# Patient Record
Sex: Female | Born: 1960
Health system: Southern US, Community
[De-identification: ages and names within clinical notes are randomized; demographics above are authoritative.]

## PROBLEM LIST (undated history)

## (undated) ENCOUNTER — Emergency Department: Admission: EM | Payer: Self-pay | Source: Home / Self Care

## (undated) DIAGNOSIS — I4891 Unspecified atrial fibrillation: Secondary | ICD-10-CM

## (undated) DIAGNOSIS — T7840XA Allergy, unspecified, initial encounter: Secondary | ICD-10-CM

## (undated) DIAGNOSIS — F419 Anxiety disorder, unspecified: Secondary | ICD-10-CM

## (undated) DIAGNOSIS — K219 Gastro-esophageal reflux disease without esophagitis: Secondary | ICD-10-CM

## (undated) DIAGNOSIS — E039 Hypothyroidism, unspecified: Secondary | ICD-10-CM

## (undated) HISTORY — DX: Gastro-esophageal reflux disease without esophagitis: K21.9

## (undated) HISTORY — PX: BRAIN SURGERY: SHX531

## (undated) HISTORY — PX: CARDIAC ELECTROPHYSIOLOGY MAPPING AND ABLATION: SHX1292

## (undated) HISTORY — DX: Allergy, unspecified, initial encounter: T78.40XA

## (undated) HISTORY — DX: Anxiety disorder, unspecified: F41.9

## (undated) NOTE — Progress Notes (Signed)
 Formatting of this note is different from the original. [facility] NEW PATIENT   Patient Name:  Kristi Carter    DOB:  [birth year]    MEDICAL RECORD [id number] Date: 02/21/2013.    Primary Care Provider: Elna Grieves, MD Primary Cardiologist: Alm Pattee, MD Primary Electrophysiologist: Ozell Ra, MD  Subjective:    History of Present Illness Kristi Carter is a 39 y.o. female who presents today for evaluation.  She is present with her husband, who provides history as well.   Arrhythmia: She presents for evaluation of fast heart rate and palpitations. Onset was 1 month ago, with stable course since that time.  She also complains of chest pain, dizziness, palpitations, rapid heart beat and shortness of breath. She denies calf pain, cough and syncope.  She is current on Toprol, and has been tried on no antiarrhythmic therapy.  She has had prior testing with echo and stress testing limiting cardiology within the last 3 years.  He states that she has had these palpitations on since her 21s with intermittent spells she been able to terminate with Valsalva maneuvers.  About 4-6 weeks ago she went to see Dr. Bartolo, who adjusted her thyroid  medicine.  In about 2-3 weeks ago she had recurrence of spells.  She has been to the Emergency Room twice (once on 12/1, once on 12/6) with a repeat episode occurred at work causing her to go home early.  The first episode required a tendency to terminate.  She has associated symptoms of heart racing or chest pressure.  More recently she has had more spell of chest pressure which are more related to her tachycardia spells.  She was referred to Dr. Pattee, whom she saw last week.  She then referred to see me for consideration for ablation.  She has a past history of hypothyroid.  She denies a past history of atrial fibrillation, CAD and HTN  Allergies Review of patient's allergies indicates no known allergies.  Medications Current Outpatient  Prescriptions  Medication Sig Dispense Refill  ? clonazePAM  (KLONOPIN ) 0.5 MG tablet Take 0.5 mg by mouth every 4 (four) hours as needed for Anxiety.      ? esomeprazole  (NEXIUM ) 40 MG capsule Take 40 mg by mouth nightly.      ? metoprolol (TOPROL-XL) 25 MG 24 hr tablet Take 1 tablet (25 mg total) by mouth daily.  30 tablet  2  ? simvastatin (ZOCOR) 40 MG tablet Take 40 mg by mouth nightly.      ? thyroid , pork, (ARMOUR THYROID ) 30 mg Tab Take by mouth every morning before breakfast.      ? digoxin (LANOXIN) 0.25 MG tablet Take 1 tablet (250 mcg total) by mouth daily.  30 tablet  1   No current facility-administered medications for this visit.   History Past Medical History  Diagnosis Date  ? SVT (supraventricular tachycardia)   ? Hypothyroid    History reviewed. No pertinent past surgical history.  Social History: She  reports that she has never smoked. She does not have any smokeless tobacco history on file. She reports that  drinks alcohol. She reports that she does not use illicit drugs.   Family History: Her family history includes Aneurysm in her paternal grandfather; Heart attack in her paternal grandfather; Hypertension in her paternal grandfather and paternal grandmother; and Stroke in her paternal grandmother.  No arrhythmias, SCD or supraventricular tachycardia   Review of Systems General:  negative for fevers, chills, fatigue, night sweats or unintentional weight  changes  Respiratory: negative for shortness of breath, cough, sputum production, wheezing or hemoptysis  Cardiovascular:   as in HPI.   Gastrointestinal:  negative for nausea, vomiting, diarrhea, heartburn, abdominal pain, blood in stool or melena  Musculoskeletal: GU: Endocrine; Hematological: Psychological:  negative for joint pain, joint swelling or myalgias negative for dysuria + hypothyroid negative for easy bruising or bleeding negative for anxiety, depression or insomnia  Neurological: negative  for headaches, seizures, tremors or sensory changes   Objective:    Physical Exam  BP 106/80  Pulse 81  Ht 5' 3 (1.6 m)  Wt 156 lb (70.761 kg)  BMI 27.64 kg/m2 Gen: Eyes:  Well developed, well-nourished female in no acute distress. No xanthelasmas, sclera anicteric   Neck: No lymphadenopathy or JVD.  Carotids brisk  Lungs:   Inspection normal. No use of accessory muscles. CTAB.  No wheezes, rales or rhonchi  Heart:  RRR.  No murmurs  GI/ABD: Soft, non-tender, no distention, no organomegaly.  +BS  Ext: No cyanosis. 0  edema.   Pulses: Pulses 1+ bilaterally  Skin: Intact, no rashes or lesions  Neuro: Psych: Mental status grossly normal Patient's mood and affect are normal   Cardiographics   1. Electrocardiogram [02/07/2013]: Supraventricular tachycardia, heart rate almost 200.  Appears to have a retrograde P wave within 40 ms the QRS complex.  Recovery sinus rhythm with nonspecific ST-T changes.  No delta wave appreciated. 2. Labs from 12/1 reviewed: Normal chemistry and complete blood count.  Thyroid  stable.  Assessment:     ICD-9-CM  1. SVT (supraventricular tachycardia) 427.89   She presents today for evaluation.  She has supraventricular tachycardia that is amenable to that is indeterminate.  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  She has had a flare in the last month, possibly coinciding with adjustment of thyroid  medicine.  That being said she is reassured rhythm that is recurrent symptomatic.  She has been to the Emergency Room twice this past month.  Plan:   Risks, benefits, and alternatives of care were discussed with patient and family today. With the recurrence of a narrow complex supraventricular tachycardia, she meets indications to consider ablation.  She is a good candidate based on her age. Risks include bleeding, stroke, pneumothorax, tearing, need for percutaneous drainage, need for intubation, emergent surgery, and even death.   After this discussion,  the patient wishes to proceed.  Will schedule accordingly.  Would like to obtain recent echocardiography stress testing for review.  It would assume that they are not within normal limits. Would add low-dose digoxin, 0.25 mg to her regimen for short-term antiarrhythmic/AV nodal control.  If spells progressed before ablation, could consider antiarrhythmic therapy as well Follow Up: No Follow-up on file.  Orders:  Orders Placed This Encounter  Procedures  ? Obtain medical records  ? Ablation SVT      The above issues and plan of care were discussed thoroughly today, with ample opportunity to ask questions.  The plan was understood and agreed to as noted above.  If there are worsening symptoms in the interim, she should seek medical attention, either at our office, or other immediate options.  Thank you for allowing me to participate her care.  If you have any questions, please do not hesitate to call.  Electronically signed by:  Ozell JINNY Ra, MD, [facility] 02/21/2013 11:44  ____________________________________________________________________________________  Portions of this note are dictated using Dragon natural speaking voice recognition software. Variances in spelling and vocabulary are  possible and unintentional. Please notify dino if any discrepancies are noted. Electronically signed by Ozell JINNY Ra, MD at 02/21/2013 11:58 AM EST

---

## 1988-03-10 HISTORY — PX: AUGMENTATION MAMMAPLASTY: SUR837

## 1999-05-14 ENCOUNTER — Other Ambulatory Visit: Admission: RE | Admit: 1999-05-14 | Discharge: 1999-05-14 | Payer: Self-pay | Admitting: Obstetrics and Gynecology

## 2000-11-23 ENCOUNTER — Other Ambulatory Visit: Admission: RE | Admit: 2000-11-23 | Discharge: 2000-11-23 | Payer: Self-pay | Admitting: Obstetrics and Gynecology

## 2001-12-08 ENCOUNTER — Other Ambulatory Visit: Admission: RE | Admit: 2001-12-08 | Discharge: 2001-12-08 | Payer: Self-pay | Admitting: Obstetrics and Gynecology

## 2014-10-20 DIAGNOSIS — K30 Functional dyspepsia: Secondary | ICD-10-CM | POA: Insufficient documentation

## 2014-10-20 DIAGNOSIS — F419 Anxiety disorder, unspecified: Secondary | ICD-10-CM | POA: Insufficient documentation

## 2014-10-20 DIAGNOSIS — E785 Hyperlipidemia, unspecified: Secondary | ICD-10-CM | POA: Insufficient documentation

## 2017-07-24 ENCOUNTER — Other Ambulatory Visit: Payer: Self-pay

## 2017-07-24 ENCOUNTER — Emergency Department
Admission: EM | Admit: 2017-07-24 | Discharge: 2017-07-24 | Disposition: A | Discharge status: Home / Self Care | Payer: BLUE CROSS/BLUE SHIELD | Attending: Emergency Medicine | Admitting: Emergency Medicine

## 2017-07-24 ENCOUNTER — Emergency Department: Payer: BLUE CROSS/BLUE SHIELD

## 2017-07-24 DIAGNOSIS — R197 Diarrhea, unspecified: Secondary | ICD-10-CM | POA: Diagnosis not present

## 2017-07-24 DIAGNOSIS — R112 Nausea with vomiting, unspecified: Secondary | ICD-10-CM | POA: Insufficient documentation

## 2017-07-24 DIAGNOSIS — R55 Syncope and collapse: Secondary | ICD-10-CM

## 2017-07-24 DIAGNOSIS — R079 Chest pain, unspecified: Secondary | ICD-10-CM | POA: Diagnosis present

## 2017-07-24 DIAGNOSIS — E039 Hypothyroidism, unspecified: Secondary | ICD-10-CM | POA: Insufficient documentation

## 2017-07-24 HISTORY — DX: Hypothyroidism, unspecified: E03.9

## 2017-07-24 LAB — BASIC METABOLIC PANEL
Anion gap: 9 (ref 5–15)
BUN: 15 mg/dL (ref 6–20)
CO2: 24 mmol/L (ref 22–32)
Calcium: 9.4 mg/dL (ref 8.9–10.3)
Chloride: 103 mmol/L (ref 101–111)
Creatinine, Ser: 0.58 mg/dL (ref 0.44–1.00)
GFR calc Af Amer: >60 mL/min (ref >60)
GFR calc non Af Amer: >60 mL/min (ref >60)
Glucose, Bld: 133 mg/dL — ABNORMAL HIGH (ref 65–99)
Potassium: 4.3 mmol/L (ref 3.5–5.1)
Sodium: 136 mmol/L (ref 135–145)

## 2017-07-24 LAB — CBC
HCT: 41.6 % (ref 35.0–47.0)
Hemoglobin: 14.4 g/dL (ref 12.0–16.0)
MCH: 34.2 pg — ABNORMAL HIGH (ref 26.0–34.0)
MCHC: 34.6 g/dL (ref 32.0–36.0)
MCV: 98.8 fL (ref 80.0–100.0)
Platelets: 373 10*3/uL (ref 150–440)
RBC: 4.21 MIL/uL (ref 3.80–5.20)
RDW: 13.3 % (ref 11.5–14.5)
WBC: 7.1 10*3/uL (ref 3.6–11.0)

## 2017-07-24 LAB — TROPONIN I
Troponin I: <0.03 ng/mL (ref <0.03)
Troponin I: <0.03 ng/mL (ref <0.03)

## 2017-07-24 MED ORDER — SODIUM CHLORIDE 0.9 % IV BOLUS
1000.0000 mL | Freq: Once | INTRAVENOUS | Status: AC
  Administered 2017-07-24: 1000 mL via INTRAVENOUS

## 2017-07-24 MED ORDER — ONDANSETRON HCL 4 MG PO TABS: 4.0000 mg | ORAL_TABLET | Freq: Every day | ORAL | 0 refills | Status: DC | PRN

## 2017-07-24 MED ORDER — ONDANSETRON HCL 4 MG/2ML IJ SOLN
4.0000 mg | Freq: Once | INTRAMUSCULAR | Status: AC
  Administered 2017-07-24: 4 mg via INTRAVENOUS
  Filled 2017-07-24: qty 2

## 2017-07-24 NOTE — ED Triage Notes (Signed)
Pt pulled for EKG.

## 2017-07-24 NOTE — ED Triage Notes (Signed)
Pt states about 3 hours ago pt felt hot and like she was going to pass out. States L hand feels numb and c/o of CP at central/L side. Also vomiting that began today as well. Alert, oriented, in wheelchair. Family with pt. Had heart ablation done a few years ago. Pt denies pain but states she feels like she is going to pass out.

## 2017-07-24 NOTE — ED Provider Notes (Signed)
[facility] Emergency Department Provider Note  ____________________________________________   First MD Initiated Contact with Patient 07/24/17 1643     (approximate)  I have reviewed the triage vital signs and the nursing notes.   HISTORY  Chief Complaint Chest Pain and Nausea  HPI Kristi Carter is a 57 y.o. female with a history of hypothyroidism as well as atrial fibrillation status post ablation 2 years ago who is presenting to the emergency department today with chest discomfort as well as near syncope and feeling uncoordinated in her left upper and lower extremity.  She says that she was doing yard work today when she came in at 1 PM and started feeling like she was going to pass out.  Said that everything "went black.  She said that she also had several episodes of vomiting and is now nauseous but has not vomited since.  Also with diarrhea this morning.  No known sick contacts.  Denies any chest pain to me but says that she has a vague discomfort and she is concerned about her arrhythmia.  She denies taking any blood thinners.  Also says that she is on and off feelings of incoordination with her left hand and left foot.  However, denies a decrease in sensation although she describes the feeling as "numbness."  Past Medical History:  Diagnosis Date  . Hypothyroid     There are no active problems to display for this patient.   Past Surgical History:  Procedure Laterality Date  . CARDIAC ELECTROPHYSIOLOGY MAPPING AND ABLATION      Prior to Admission medications   Not on File    Allergies Patient has no known allergies.  History reviewed. No pertinent family history.  Social History Social History   Tobacco Use  . Smoking status: Never Smoker  Substance Use Topics  . Alcohol use: Yes  . Drug use: Not on file    Review of Systems  Constitutional: No fever/chills Eyes: No visual changes. ENT: No sore throat. Cardiovascular: As  above Respiratory: Denies shortness of breath. Gastrointestinal: No abdominal pain.  No constipation. Genitourinary: Negative for dysuria. Musculoskeletal: Negative for back pain. Skin: Negative for rash. Neurological: As above   ____________________________________________   PHYSICAL EXAM:  VITAL SIGNS: ED Triage Vitals  Enc Vitals Group     BP 07/24/17 1555 118/83     Pulse Rate 07/24/17 1555 (!) 102     Resp 07/24/17 1555 20     Temp 07/24/17 1555 98.1 F (36.7 C)     Temp Source 07/24/17 1555 Oral     SpO2 07/24/17 1555 100 %     Weight 07/24/17 1556 145 lb (65.8 kg)     Height 07/24/17 1556 5\' 3"  (1.6 m)     Head Circumference --      Peak Flow --      Pain Score 07/24/17 1556 0     Pain Loc --      Pain Edu? --      Excl. in [place]? --     Constitutional: Alert and oriented. Well appearing and in no acute distress. Eyes: Conjunctivae are normal.  Head: Atraumatic. Nose: No congestion/rhinnorhea. Mouth/Throat: Mucous membranes are moist.  Neck: No stridor.   Cardiovascular: Normal rate, regular rhythm. Grossly normal heart sounds.  Good peripheral circulation with equal and bilateral radial pulses. Respiratory: Normal respiratory effort.  No retractions. Lungs CTAB. Gastrointestinal: Soft and nontender. No distention. No CVA tenderness. Musculoskeletal: No lower extremity tenderness nor edema.  No joint effusions. Neurologic:  Normal speech and language. No gross focal neurologic deficits are appreciated.  Normal gait while walking unassisted.  No ataxia on finger-to-nose testing.  Negative for dysdiadochokinesia bilaterally. Skin:  Skin is warm, dry and intact. No rash noted. Psychiatric: Mood and affect are normal. Speech and behavior are normal.  ____________________________________________   LABS (all labs ordered are listed, but only abnormal results are displayed)  Labs Reviewed  BASIC METABOLIC PANEL - Abnormal; Notable for the following components:       Result Value   Glucose, Bld 133 (*)    All other components within normal limits  CBC - Abnormal; Notable for the following components:   MCH 34.2 (*)    All other components within normal limits  TROPONIN I  TROPONIN I  POC URINE PREG, ED   ____________________________________________  EKG  ED ECG REPORT I, Doran Stabler, the attending physician, personally viewed and interpreted this ECG.   Date: 07/24/2017  EKG Time: 1519  Rate: 105  Rhythm: sinus tachycardia  Axis: Normal  Intervals:none  ST&T Change: No ST segment elevation or depression.  No abnormal T wave inversion.  However, the EKG is confounded by static on the baseline.  ED ECG REPORT I, Doran Stabler, the attending physician, personally viewed and interpreted this ECG.   Date: 07/24/2017  EKG Time: 1728  Rate: 85  Rhythm: normal sinus rhythm  Axis: Normal  Intervals:none  ST&T Change: No ST segment elevation or depression.  No abnormal T wave inversion.  ____________________________________________  RADIOLOGY  Chest x-ray with 2 nodular appearing opacities in the gastric air bubble.  Concern for debris versus mass. ____________________________________________   PROCEDURES  Procedure(s) performed: None  Procedures  Critical Care performed: No  ____________________________________________   INITIAL IMPRESSION / ASSESSMENT AND PLAN / ED COURSE  Pertinent labs & imaging results that were available during my care of the patient were reviewed by me and considered in my medical decision making (see chart for details).  Differential diagnosis includes, but is not limited to, ACS, aortic dissection, pulmonary embolism, cardiac tamponade, pneumothorax, pneumonia, pericarditis, myocarditis, GI-related causes including esophagitis/gastritis, and musculoskeletal chest wall pain.   As part of my medical decision making, I reviewed the following data within the [place] previous  records on file for review  Patient says that she is aware of the nodules in the stomach.  Says that she has had endoscopies in the past with removal of nodules.  However, has not followed up with gastroenterology since moving from [place].  I will give her follow-up with gastroenterology.  Overall I feel the presentation likely evidence of viral illness with nursing be secondary to nausea, vomiting and diarrhea.  So far the patient's EKGs have been reassuring.  Denying any chest pain.  Heart rate on second EKG is 85 bpm.   ----------------------------------------- 6:36 PM on 07/24/2017 -----------------------------------------  Patient at this time with resolution of symptoms.  No nausea.  Denies any feeling of uncoordination with the left upper and lower extremities.  Second troponin is normal.  No further complaint of chest pressure.  I do not believe that the patient is having an acute coronary issue nor a pulmonary embolus.  Likely viral issue causing slight dehydration and near syncope.  I discussed the diagnosis as well as treatment plan with the patient as well as her husband who is at the bedside.  They are understanding of the plan for discharge to home and willing to  comply.  ____________________________________________   FINAL CLINICAL IMPRESSION(S) / ED DIAGNOSES  Nausea vomiting and diarrhea.  Chest pain.    NEW MEDICATIONS STARTED DURING THIS VISIT:  New Prescriptions   No medications on file     Note:  This document was prepared using Dragon voice recognition software and may include unintentional dictation errors.     Orbie Pyo, MD 07/24/17 [phone]

## 2017-09-16 ENCOUNTER — Other Ambulatory Visit: Payer: Self-pay | Admitting: Internal Medicine

## 2017-09-16 DIAGNOSIS — M25473 Effusion, unspecified ankle: Secondary | ICD-10-CM

## 2017-09-18 ENCOUNTER — Ambulatory Visit: Payer: Self-pay

## 2017-09-21 ENCOUNTER — Ambulatory Visit
Admission: RE | Admit: 2017-09-21 | Discharge: 2017-09-21 | Disposition: A | Discharge status: Home / Self Care | Payer: BLUE CROSS/BLUE SHIELD | Source: Ambulatory Visit | Attending: Internal Medicine | Admitting: Internal Medicine

## 2017-09-21 DIAGNOSIS — M25473 Effusion, unspecified ankle: Secondary | ICD-10-CM

## 2017-09-21 DIAGNOSIS — M7989 Other specified soft tissue disorders: Secondary | ICD-10-CM | POA: Insufficient documentation

## 2018-03-05 ENCOUNTER — Other Ambulatory Visit: Payer: Self-pay | Admitting: Endocrinology

## 2018-03-05 DIAGNOSIS — E041 Nontoxic single thyroid nodule: Secondary | ICD-10-CM

## 2018-03-19 ENCOUNTER — Ambulatory Visit
Admission: RE | Admit: 2018-03-19 | Discharge: 2018-03-19 | Disposition: A | Discharge status: Home / Self Care | Payer: BLUE CROSS/BLUE SHIELD | Source: Ambulatory Visit | Attending: Endocrinology | Admitting: Endocrinology

## 2018-03-19 DIAGNOSIS — E041 Nontoxic single thyroid nodule: Secondary | ICD-10-CM

## 2018-04-07 DIAGNOSIS — E039 Hypothyroidism, unspecified: Secondary | ICD-10-CM | POA: Diagnosis not present

## 2018-04-07 DIAGNOSIS — E041 Nontoxic single thyroid nodule: Secondary | ICD-10-CM | POA: Diagnosis not present

## 2018-04-14 DIAGNOSIS — E039 Hypothyroidism, unspecified: Secondary | ICD-10-CM | POA: Diagnosis not present

## 2018-04-14 DIAGNOSIS — E041 Nontoxic single thyroid nodule: Secondary | ICD-10-CM | POA: Diagnosis not present

## 2018-04-14 DIAGNOSIS — K219 Gastro-esophageal reflux disease without esophagitis: Secondary | ICD-10-CM | POA: Diagnosis not present

## 2018-06-14 DIAGNOSIS — E039 Hypothyroidism, unspecified: Secondary | ICD-10-CM | POA: Diagnosis not present

## 2018-06-14 DIAGNOSIS — Z0000 Encounter for general adult medical examination without abnormal findings: Secondary | ICD-10-CM | POA: Diagnosis not present

## 2018-06-14 DIAGNOSIS — F419 Anxiety disorder, unspecified: Secondary | ICD-10-CM | POA: Diagnosis not present

## 2018-06-14 DIAGNOSIS — K219 Gastro-esophageal reflux disease without esophagitis: Secondary | ICD-10-CM | POA: Diagnosis not present

## 2018-06-17 MED FILL — ATORVASTATIN 40 MG TABLET: 40 | 90 days supply | Qty: 90 | Fill #0

## 2018-06-30 MED FILL — ESOMEPRAZOLE MAG DR 40 MG C: 40 | 90 days supply | Qty: 90 | Fill #0

## 2018-08-05 MED FILL — clonazePAM 0.5 MG TABS: 0.5 | 30 days supply | Qty: 30 | Fill #0

## 2018-08-25 MED FILL — LEVOTHYROXINE 50 MCG TABLET: 50 | 90 days supply | Qty: 90 | Fill #0

## 2018-08-26 DIAGNOSIS — R739 Hyperglycemia, unspecified: Secondary | ICD-10-CM | POA: Diagnosis not present

## 2018-08-26 DIAGNOSIS — R945 Abnormal results of liver function studies: Secondary | ICD-10-CM | POA: Diagnosis not present

## 2018-09-13 MED FILL — ATORVASTATIN 40 MG TABLET: 40 | 90 days supply | Qty: 90 | Fill #1

## 2018-09-13 MED FILL — clonazePAM 0.5 MG TABS: 0.5 | 30 days supply | Qty: 30 | Fill #1

## 2018-09-22 MED FILL — ESOMEPRAZOLE MAG DR 40 MG C: 40 | 90 days supply | Qty: 90 | Fill #1

## 2018-10-14 MED FILL — clonazePAM 0.5 MG TABS: 0.5 | 30 days supply | Qty: 30 | Fill #0

## 2018-11-24 MED FILL — LEVOTHYROXINE 50 MCG TABLET: 50 | 90 days supply | Qty: 90 | Fill #1

## 2018-12-06 MED FILL — clonazePAM 0.5 MG TABS: 0.5 | 30 days supply | Qty: 30 | Fill #1

## 2018-12-20 MED FILL — ATORVASTATIN 40 MG TABLET: 40 | 90 days supply | Qty: 90 | Fill #2

## 2018-12-21 MED FILL — ESOMEPRAZOLE MAG DR 40 MG C: 40 | 90 days supply | Qty: 90 | Fill #2

## 2018-12-27 DIAGNOSIS — J0110 Acute frontal sinusitis, unspecified: Secondary | ICD-10-CM | POA: Diagnosis not present

## 2018-12-31 MED FILL — clonazePAM 0.5 MG TABS: 0.5 | 30 days supply | Qty: 30 | Fill #0

## 2019-01-04 DIAGNOSIS — G500 Trigeminal neuralgia: Secondary | ICD-10-CM | POA: Diagnosis not present

## 2019-02-09 MED FILL — clonazePAM 0.5 MG TABS: 0.5 | 30 days supply | Qty: 30 | Fill #1

## 2019-02-09 MED FILL — IBUPROFEN 800 MG TAB: 800 | 7 days supply | Qty: 30 | Fill #0

## 2019-03-01 MED FILL — LEVOTHYROXINE 50 MCG TABLET: 50 | 90 days supply | Qty: 90 | Fill #2

## 2019-03-21 MED FILL — ESOMEPRAZOLE MAG DR 40 MG C: 40 | 90 days supply | Qty: 90 | Fill #3

## 2019-03-21 MED FILL — IBUPROFEN 800 MG TAB: 800 | 7 days supply | Qty: 30 | Fill #0

## 2019-03-22 MED FILL — clonazePAM 0.5 MG TABS: 0.5 | 30 days supply | Qty: 30 | Fill #0

## 2019-03-22 MED FILL — ATORVASTATIN 40 MG TABLET: 40 | 90 days supply | Qty: 90 | Fill #3

## 2019-04-30 MED FILL — IBUPROFEN 800 MG TAB: 800 | 7 days supply | Qty: 30 | Fill #0

## 2019-05-09 MED FILL — clonazePAM 0.5 MG TABS: 0.5 | 30 days supply | Qty: 30 | Fill #1

## 2019-05-26 DIAGNOSIS — E041 Nontoxic single thyroid nodule: Secondary | ICD-10-CM | POA: Diagnosis not present

## 2019-05-26 DIAGNOSIS — K219 Gastro-esophageal reflux disease without esophagitis: Secondary | ICD-10-CM | POA: Diagnosis not present

## 2019-05-26 DIAGNOSIS — E039 Hypothyroidism, unspecified: Secondary | ICD-10-CM | POA: Diagnosis not present

## 2019-05-26 MED FILL — LEVOTHYROXINE 50 MCG TABLET: 50 | 90 days supply | Qty: 90 | Fill #0

## 2019-05-27 MED FILL — IBUPROFEN 800 MG TAB: 800 | 7 days supply | Qty: 30 | Fill #0

## 2019-06-01 MED FILL — IBUPROFEN 800 MG TAB: 800 | 10 days supply | Qty: 30 | Fill #0

## 2019-06-03 ENCOUNTER — Emergency Department: Payer: No Typology Code available for payment source

## 2019-06-03 ENCOUNTER — Other Ambulatory Visit: Payer: Self-pay

## 2019-06-03 ENCOUNTER — Emergency Department
Admission: EM | Admit: 2019-06-03 | Discharge: 2019-06-03 | Disposition: A | Discharge status: Home / Self Care | Payer: No Typology Code available for payment source | Attending: Student | Admitting: Student

## 2019-06-03 DIAGNOSIS — R109 Unspecified abdominal pain: Secondary | ICD-10-CM | POA: Insufficient documentation

## 2019-06-03 DIAGNOSIS — E039 Hypothyroidism, unspecified: Secondary | ICD-10-CM | POA: Diagnosis not present

## 2019-06-03 DIAGNOSIS — N390 Urinary tract infection, site not specified: Secondary | ICD-10-CM | POA: Insufficient documentation

## 2019-06-03 DIAGNOSIS — R110 Nausea: Secondary | ICD-10-CM | POA: Insufficient documentation

## 2019-06-03 DIAGNOSIS — R140 Abdominal distension (gaseous): Secondary | ICD-10-CM | POA: Insufficient documentation

## 2019-06-03 DIAGNOSIS — R309 Painful micturition, unspecified: Secondary | ICD-10-CM | POA: Diagnosis present

## 2019-06-03 DIAGNOSIS — R509 Fever, unspecified: Secondary | ICD-10-CM | POA: Insufficient documentation

## 2019-06-03 HISTORY — DX: Unspecified atrial fibrillation: I48.91

## 2019-06-03 LAB — COMPREHENSIVE METABOLIC PANEL
ALT: 21 U/L (ref 0–44)
AST: 33 U/L (ref 15–41)
Albumin: 3.5 g/dL (ref 3.5–5.0)
Alkaline Phosphatase: 96 U/L (ref 38–126)
Anion gap: 10 (ref 5–15)
BUN: 9 mg/dL (ref 6–20)
CO2: 23 mmol/L (ref 22–32)
Calcium: 9.2 mg/dL (ref 8.9–10.3)
Chloride: 106 mmol/L (ref 98–111)
Creatinine, Ser: 0.59 mg/dL (ref 0.44–1.00)
GFR calc Af Amer: >60 mL/min (ref >60)
GFR calc non Af Amer: >60 mL/min (ref >60)
Glucose, Bld: 88 mg/dL (ref 70–99)
Potassium: 3.9 mmol/L (ref 3.5–5.1)
Sodium: 139 mmol/L (ref 135–145)
Total Bilirubin: 0.7 mg/dL (ref 0.3–1.2)
Total Protein: 7.3 g/dL (ref 6.5–8.1)

## 2019-06-03 LAB — CK: Total CK: 32 U/L — ABNORMAL LOW (ref 38–234)

## 2019-06-03 LAB — URINALYSIS, COMPLETE (UACMP) WITH MICROSCOPIC
Bilirubin Urine: NEGATIVE
Glucose, UA: NEGATIVE mg/dL
Ketones, ur: NEGATIVE mg/dL
Nitrite: NEGATIVE
Protein, ur: >=300 mg/dL — AB
RBC / HPF: >50 RBC/hpf — ABNORMAL HIGH (ref 0–5)
Specific Gravity, Urine: 1.016 (ref 1.005–1.030)
WBC, UA: >50 WBC/hpf — ABNORMAL HIGH (ref 0–5)
pH: 5 (ref 5.0–8.0)

## 2019-06-03 LAB — CBC
HCT: 41.4 % (ref 36.0–46.0)
Hemoglobin: 14 g/dL (ref 12.0–15.0)
MCH: 32.4 pg (ref 26.0–34.0)
MCHC: 33.8 g/dL (ref 30.0–36.0)
MCV: 95.8 fL (ref 80.0–100.0)
Platelets: 336 10*3/uL (ref 150–400)
RBC: 4.32 MIL/uL (ref 3.87–5.11)
RDW: 11.8 % (ref 11.5–15.5)
WBC: 8 10*3/uL (ref 4.0–10.5)
nRBC: 0 % (ref 0.0–0.2)

## 2019-06-03 LAB — LIPASE, BLOOD: Lipase: 39 U/L (ref 11–51)

## 2019-06-03 MED ORDER — SODIUM CHLORIDE 0.9% FLUSH: 3.0000 mL | Freq: Once | INTRAVENOUS | Status: DC

## 2019-06-03 MED ORDER — SODIUM CHLORIDE 0.9 % IV BOLUS
1000.0000 mL | Freq: Once | INTRAVENOUS | Status: AC
  Administered 2019-06-03: 1000 mL via INTRAVENOUS

## 2019-06-03 MED ORDER — SODIUM CHLORIDE 0.9 % IV SOLN
1.0000 g | Freq: Once | INTRAVENOUS | Status: AC
  Administered 2019-06-03: 1 g via INTRAVENOUS
  Filled 2019-06-03: qty 10

## 2019-06-03 MED ORDER — FLUCONAZOLE 150 MG PO TABS: ORAL_TABLET | ORAL | 0 refills | Status: DC

## 2019-06-03 MED ORDER — CEPHALEXIN 500 MG PO CAPS: 500.0000 mg | ORAL_CAPSULE | Freq: Three times a day (TID) | ORAL | 0 refills | Status: DC

## 2019-06-03 NOTE — Discharge Instructions (Addendum)
Follow-up with your regular doctor concerning your urinary tract infection.  Start taking your Keflex tomorrow.  Diflucan was also called in for you.  Drink plenty of water.  Follow-up with your regular doctor concerning your CT results.

## 2019-06-03 NOTE — ED Triage Notes (Signed)
Pt c/o painful urination for the past week and got results back from [facility] yesterday dx with UTI and started abx last night, states she is having severe pain now that is generalized abd pain with flank pain and nausea.

## 2019-06-03 NOTE — ED Provider Notes (Signed)
[facility] Emergency Department Provider Note  ____________________________________________   First MD Initiated Contact with Patient 06/03/19 1042     (approximate)  I have reviewed the triage vital signs and the nursing notes.   HISTORY  Chief Complaint Abdominal Pain    HPI Kristi Carter is a 59 y.o. female presents emergency department complaining of painful urination for 1 week.  Was told by her regular doctor that she has a UTI yesterday.  Started antibiotics last night.  However started having fever, flank pain, nausea but no vomiting.  Unsure of the actual temperature but states she felt hot.  She is also complaining abdominal bloating.  Patient has history of chronic diarrhea.    Past Medical History:  Diagnosis Date  . A-fib ([place])   . Hypothyroid     There are no problems to display for this patient.   Past Surgical History:  Procedure Laterality Date  . CARDIAC ELECTROPHYSIOLOGY MAPPING AND ABLATION      Prior to Admission medications   Medication Sig Start Date End Date Taking? Authorizing Provider  cephALEXin (KEFLEX) 500 MG capsule Take 1 capsule (500 mg total) by mouth 3 (three) times daily. 06/03/19   Andric Kerce, Linden Dolin, PA-C  fluconazole (DIFLUCAN) 150 MG tablet Take one now and one in a week 06/03/19   Caryn Section, Linden Dolin, PA-C  ondansetron (ZOFRAN) 4 MG tablet Take 1 tablet (4 mg total) by mouth daily as needed. 07/24/17   Schaevitz, Randall An, MD    Allergies Patient has no known allergies.  No family history on file.  Social History Social History   Tobacco Use  . Smoking status: Never Smoker  . Smokeless tobacco: Never Used  Substance Use Topics  . Alcohol use: Yes  . Drug use: Not on file    Review of Systems  Constitutional: No fever/chills Eyes: No visual changes. ENT: No sore throat. Respiratory: Denies cough Cardiovascular: Denies chest pain Gastrointestinal: Positive abdominal pain Genitourinary:  Positive for dysuria. Musculoskeletal: Negative for back pain. Skin: Negative for rash. Psychiatric: no mood changes,     ____________________________________________   PHYSICAL EXAM:  VITAL SIGNS: ED Triage Vitals  Enc Vitals Group     BP 06/03/19 0737 134/67     Pulse Rate 06/03/19 0737 88     Resp 06/03/19 0737 17     Temp 06/03/19 0737 97.6 F (36.4 C)     Temp Source 06/03/19 0737 Oral     SpO2 06/03/19 0737 97 %     Weight 06/03/19 0729 155 lb (70.3 kg)     Height 06/03/19 0729 5\' 3"  (1.6 m)     Head Circumference --      Peak Flow --      Pain Score 06/03/19 0729 6     Pain Loc --      Pain Edu? --      Excl. in [place]? --     Constitutional: Alert and oriented. Well appearing and in no acute distress. Eyes: Conjunctivae are normal.  Head: Atraumatic. Nose: No congestion/rhinnorhea. Mouth/Throat: Mucous membranes are moist.   Neck:  supple no lymphadenopathy noted Cardiovascular: Normal rate, regular rhythm. Heart sounds are normal Respiratory: Normal respiratory effort.  No retractions, lungs c t a  Abd: soft tender in the right flank, bs normal all 4 quad GU: deferred Musculoskeletal: FROM all extremities, warm and well perfused Neurologic:  Normal speech and language.  Skin:  Skin is warm, dry and intact. No rash noted.  Psychiatric: Mood and affect are normal. Speech and behavior are normal.  ____________________________________________   LABS (all labs ordered are listed, but only abnormal results are displayed)  Labs Reviewed  URINALYSIS, COMPLETE (UACMP) WITH MICROSCOPIC - Abnormal; Notable for the following components:      Result Value   Color, Urine AMBER (*)    APPearance CLOUDY (*)    Hgb urine dipstick LARGE (*)    Protein, ur >=300 (*)    Leukocytes,Ua LARGE (*)    RBC / HPF >50 (*)    WBC, UA >50 (*)    Bacteria, UA RARE (*)    All other components within normal limits  CK - Abnormal; Notable for the following components:   Total CK 32  (*)    All other components within normal limits  URINE CULTURE  LIPASE, BLOOD  COMPREHENSIVE METABOLIC PANEL  CBC   ____________________________________________   ____________________________________________  RADIOLOGY  CT renal stone is negative for stone but does show thickening of the bladder typical for cystitis.  Other findings include some lesions on the ileum which should be addressed by the patient's primary physician.  ____________________________________________   PROCEDURES  Procedure(s) performed: Rocephin 1 g IV   Procedures    ____________________________________________   INITIAL IMPRESSION / ASSESSMENT AND PLAN / ED COURSE  Pertinent labs & imaging results that were available during my care of the patient were reviewed by me and considered in my medical decision making (see chart for details).   Patient is 59 year old female presents emergency department for flank pain along with dysuria.  Diagnosed with UTI yesterday and started on oral antibiotics.  Physical exam shows patient to have normal vitals and appears to be stable.  Some flank tenderness on palpation.  Remainder exam is unremarkable  DDx: Kidney stone, pyelonephritis, UTI  CBC is normal, lipase normal, comprehensive metabolic panel is normal, urinalysis shows large hemoglobin, greater than 300 protein, large leuks, greater than 50 RBCs, greater than 50 WBCs, rare bacteria, white blood cell clumps  Due to the amount of infection noted in the urine combined with the flank pain I did a CT renal study to ensure she does not have infected kidney stone  CT renal stone is negative for kidney stone and hydronephrosis.  Due to lack of temp along with no infection noted along the kidneys.  Patient has bilateral she has marked just a lower urinary tract infection.  Patient was treated with Rocephin 1 g IV, prescription for Keflex 500 3 times daily, Diflucan 150 mg 1 now and 1 in a week.  Follow-up with her  regular doctor concerning her CT results that show abnormality along the ileum.  She states she understands will comply.  She is discharged in stable condition.      Kristi Carter was evaluated in Emergency Department on 06/03/2019 for the symptoms described in the history of present illness. She was evaluated in the context of the global COVID-19 pandemic, which necessitated consideration that the patient might be at risk for infection with the SARS-CoV-2 virus that causes COVID-19. Institutional protocols and algorithms that pertain to the evaluation of patients at risk for COVID-19 are in a state of rapid change based on information released by regulatory bodies including the CDC and federal and state organizations. These policies and algorithms were followed during the patient's care in the ED.   As part of my medical decision making, I reviewed the following data within the [place] notes reviewed  and incorporated, Labs reviewed , Old chart reviewed, Radiograph reviewed , Notes from prior ED visits and [place] Controlled Substance Database  ____________________________________________   FINAL CLINICAL IMPRESSION(S) / ED DIAGNOSES  Final diagnoses:  Acute UTI      NEW MEDICATIONS STARTED DURING THIS VISIT:  New Prescriptions   CEPHALEXIN (KEFLEX) 500 MG CAPSULE    Take 1 capsule (500 mg total) by mouth 3 (three) times daily.   FLUCONAZOLE (DIFLUCAN) 150 MG TABLET    Take one now and one in a week     Note:  This document was prepared using Dragon voice recognition software and may include unintentional dictation errors.    Versie Starks, PA-C 06/03/19 1341    Lilia Pro., MD 06/03/19 1355

## 2019-06-04 LAB — URINE CULTURE: Culture: NO GROWTH

## 2019-06-06 MED FILL — clonazePAM 0.5 MG TABS: 0.5 | 30 days supply | Qty: 30 | Fill #0

## 2019-06-20 ENCOUNTER — Other Ambulatory Visit (HOSPITAL_COMMUNITY): Payer: Self-pay | Admitting: Endocrinology

## 2019-06-20 MED FILL — ESOMEPRAZOLE MAG DR 40 MG C: 40 | 90 days supply | Qty: 90 | Fill #0

## 2019-06-24 MED FILL — ATORVASTATIN 40 MG TABLET: 40 | 90 days supply | Qty: 90 | Fill #0

## 2019-06-28 ENCOUNTER — Ambulatory Visit
Admission: EM | Admit: 2019-06-28 | Discharge: 2019-06-28 | Disposition: A | Discharge status: Home / Self Care | Payer: No Typology Code available for payment source | Attending: Emergency Medicine | Admitting: Emergency Medicine

## 2019-06-28 ENCOUNTER — Other Ambulatory Visit: Payer: Self-pay

## 2019-06-28 DIAGNOSIS — N3090 Cystitis, unspecified without hematuria: Secondary | ICD-10-CM | POA: Insufficient documentation

## 2019-06-28 LAB — POCT URINALYSIS DIP (MANUAL ENTRY)
Bilirubin, UA: NEGATIVE
Glucose, UA: NEGATIVE mg/dL
Ketones, POC UA: NEGATIVE mg/dL
Nitrite, UA: NEGATIVE
Spec Grav, UA: >=1.03 — AB (ref 1.010–1.025)
Urobilinogen, UA: 0.2 E.U./dL
pH, UA: 5.5 (ref 5.0–8.0)

## 2019-06-28 MED ORDER — NITROFURANTOIN MONOHYD MACRO 100 MG PO CAPS: 100.0000 mg | ORAL_CAPSULE | Freq: Two times a day (BID) | ORAL | 0 refills | Status: DC

## 2019-06-28 MED ORDER — FLUCONAZOLE 150 MG PO TABS: 150.0000 mg | ORAL_TABLET | Freq: Every day | ORAL | 0 refills | Status: DC

## 2019-06-28 NOTE — Discharge Instructions (Addendum)
Take the Macrobid and Diflucan as directed.  Follow-up with your primary care provider as scheduled in 2 weeks.

## 2019-06-28 NOTE — ED Provider Notes (Signed)
Roderic Palau    CSN: [id number] Arrival date & time: 06/28/19  1501      History   Chief Complaint Chief Complaint  Patient presents with  . Urinary Urgency    HPI Kristi Carter is a 59 y.o. female.   Patient presents with dysuria, urgency, frequency x1 day.  She also reports lower abdominal pressure.  She denies fever, chills, hematuria, vaginal symptoms, pelvic pain, back pain, or other symptoms.  Patient was seen in the ED on 06/03/2019; diagnosed with acute UTI; had a negative renal stone CT; treated with IV Rocephin and sent home with oral Keflex and Diflucan.    The history is provided by the patient.    Past Medical History:  Diagnosis Date  . A-fib ([place])   . Hypothyroid     There are no problems to display for this patient.   Past Surgical History:  Procedure Laterality Date  . CARDIAC ELECTROPHYSIOLOGY MAPPING AND ABLATION      OB History   No obstetric history on file.      Home Medications    Prior to Admission medications   Medication Sig Start Date End Date Taking? Authorizing Provider  atorvastatin (LIPITOR) 40 MG tablet  03/22/19  Yes [provider]  clonazePAM (KLONOPIN) 0.5 MG tablet Take 0.5 mg by mouth daily as needed. 05/09/19  Yes [provider]  esomeprazole ([place]) 40 MG capsule  03/21/19  Yes [provider]  levothyroxine (SYNTHROID) 50 MCG tablet Take 50 mcg by mouth every morning. 05/26/19  Yes [provider]  cephALEXin (KEFLEX) 500 MG capsule Take 1 capsule (500 mg total) by mouth 3 (three) times daily. 06/03/19   Fisher, Linden Dolin, PA-C  fluconazole (DIFLUCAN) 150 MG tablet Take 1 tablet (150 mg total) by mouth daily. Take one tablet today.  May repeat in 3 days. 06/28/19   Sharion Balloon, NP  nitrofurantoin, macrocrystal-monohydrate, (MACROBID) 100 MG capsule Take 1 capsule (100 mg total) by mouth 2 (two) times daily. 06/28/19   Sharion Balloon, NP  ondansetron (ZOFRAN) 4 MG tablet Take 1 tablet  (4 mg total) by mouth daily as needed. 07/24/17   Schaevitz, Randall An, MD    Family History Family History  Problem Relation Age of Onset  . Healthy Father     Social History Social History   Tobacco Use  . Smoking status: Never Smoker  . Smokeless tobacco: Never Used  Substance Use Topics  . Alcohol use: Yes  . Drug use: Never     Allergies   Patient has no known allergies.   Review of Systems Review of Systems  Constitutional: Negative for chills and fever.  HENT: Negative for ear pain and sore throat.   Eyes: Negative for pain and visual disturbance.  Respiratory: Negative for cough and shortness of breath.   Cardiovascular: Negative for chest pain and palpitations.  Gastrointestinal: Positive for abdominal pain. Negative for vomiting.  Genitourinary: Positive for dysuria, frequency and urgency. Negative for flank pain, hematuria and vaginal discharge.  Musculoskeletal: Negative for arthralgias and back pain.  Skin: Negative for color change and rash.  Neurological: Negative for seizures and syncope.  All other systems reviewed and are negative.    Physical Exam Triage Vital Signs ED Triage Vitals [06/28/19 1503]  Enc Vitals Group     BP      Pulse      Resp      Temp      Temp src  SpO2      Weight 155 lb (70.3 kg)     Height 5\' 3"  (1.6 m)     Head Circumference      Peak Flow      Pain Score 6     Pain Loc      Pain Edu?      Excl. in [place]?    No data found.  Updated Vital Signs BP 128/87 (BP Location: Left Arm)   Pulse 92   Temp 98.2 F (36.8 C) (Oral)   Resp 18   Ht 5\' 3"  (1.6 m)   Wt 155 lb (70.3 kg)   SpO2 92%   BMI 27.46 kg/m   Visual Acuity Right Eye Distance:   Left Eye Distance:   Bilateral Distance:    Right Eye Near:   Left Eye Near:    Bilateral Near:     Physical Exam Vitals and nursing note reviewed.  Constitutional:      General: She is not in acute distress.    Appearance: She is well-developed.  HENT:       Head: Normocephalic and atraumatic.     Mouth/Throat:     Mouth: Mucous membranes are moist.     Pharynx: Oropharynx is clear.  Eyes:     Conjunctiva/sclera: Conjunctivae normal.  Cardiovascular:     Rate and Rhythm: Normal rate and regular rhythm.     Heart sounds: No murmur.  Pulmonary:     Effort: Pulmonary effort is normal. No respiratory distress.     Breath sounds: Normal breath sounds.  Abdominal:     General: Bowel sounds are normal.     Palpations: Abdomen is soft.     Tenderness: There is no abdominal tenderness. There is no right CVA tenderness, left CVA tenderness, guarding or rebound.  Musculoskeletal:     Cervical back: Neck supple.  Skin:    General: Skin is warm and dry.     Findings: No rash.  Neurological:     General: No focal deficit present.     Mental Status: She is alert and oriented to person, place, and time.  Psychiatric:        Mood and Affect: Mood normal.        Behavior: Behavior normal.      UC Treatments / Results  Labs (all labs ordered are listed, but only abnormal results are displayed) Labs Reviewed  POCT URINALYSIS DIP (MANUAL ENTRY) - Abnormal; Notable for the following components:      Result Value   Spec Grav, UA >=1.030 (*)    Blood, UA small (*)    Protein Ur, POC trace (*)    Leukocytes, UA Small (1+) (*)    All other components within normal limits  URINE CULTURE    EKG   Radiology No results found.  Procedures Procedures (including critical care time)  Medications Ordered in UC Medications - No data to display  Initial Impression / Assessment and Plan / UC Course  I have reviewed the triage vital signs and the nursing notes.  Pertinent labs & imaging results that were available during my care of the patient were reviewed by me and considered in my medical decision making (see chart for details).   Cystitis.  Urine culture pending.  Treating with Macrobid.  Also treating with Diflucan due to patient report  of yeast infections when taking antibiotics.  Instructed patient to follow-up with her PCP as scheduled in 2 weeks.  Instructed her to follow-up  sooner if her symptoms do not improve.  Patient agrees to plan of care.     Final Clinical Impressions(s) / UC Diagnoses   Final diagnoses:  Cystitis     Discharge Instructions     Take the Macrobid and Diflucan as directed.  Follow-up with your primary care provider as scheduled in 2 weeks.       ED Prescriptions    Medication Sig Dispense Auth. Provider   fluconazole (DIFLUCAN) 150 MG tablet Take 1 tablet (150 mg total) by mouth daily. Take one tablet today.  May repeat in 3 days. 2 tablet Sharion Balloon, NP   nitrofurantoin, macrocrystal-monohydrate, (MACROBID) 100 MG capsule Take 1 capsule (100 mg total) by mouth 2 (two) times daily. 10 capsule Sharion Balloon, NP     PDMP not reviewed this encounter.   Sharion Balloon, NP 06/28/19 [phone]

## 2019-06-28 NOTE — ED Triage Notes (Signed)
Patient complains of urinary urgency, frequency and painful urination that started yesterday.

## 2019-06-30 LAB — URINE CULTURE: Culture: >=100000 — AB

## 2019-07-01 ENCOUNTER — Telehealth: Payer: Self-pay

## 2019-07-09 ENCOUNTER — Emergency Department
Admission: EM | Admit: 2019-07-09 | Discharge: 2019-07-09 | Disposition: A | Discharge status: Home / Self Care | Payer: No Typology Code available for payment source | Attending: Emergency Medicine | Admitting: Emergency Medicine

## 2019-07-09 ENCOUNTER — Emergency Department: Payer: No Typology Code available for payment source

## 2019-07-09 ENCOUNTER — Other Ambulatory Visit: Payer: Self-pay

## 2019-07-09 ENCOUNTER — Encounter: Payer: Self-pay | Admitting: Emergency Medicine

## 2019-07-09 DIAGNOSIS — N390 Urinary tract infection, site not specified: Secondary | ICD-10-CM | POA: Insufficient documentation

## 2019-07-09 DIAGNOSIS — R350 Frequency of micturition: Secondary | ICD-10-CM | POA: Insufficient documentation

## 2019-07-09 DIAGNOSIS — R300 Dysuria: Secondary | ICD-10-CM | POA: Diagnosis present

## 2019-07-09 DIAGNOSIS — N12 Tubulo-interstitial nephritis, not specified as acute or chronic: Secondary | ICD-10-CM | POA: Diagnosis not present

## 2019-07-09 DIAGNOSIS — E039 Hypothyroidism, unspecified: Secondary | ICD-10-CM | POA: Insufficient documentation

## 2019-07-09 DIAGNOSIS — R1030 Lower abdominal pain, unspecified: Secondary | ICD-10-CM | POA: Diagnosis not present

## 2019-07-09 DIAGNOSIS — Z79899 Other long term (current) drug therapy: Secondary | ICD-10-CM | POA: Diagnosis not present

## 2019-07-09 LAB — URINALYSIS, COMPLETE (UACMP) WITH MICROSCOPIC
Bilirubin Urine: NEGATIVE
Glucose, UA: NEGATIVE mg/dL
Ketones, ur: NEGATIVE mg/dL
Nitrite: POSITIVE — AB
Protein, ur: 30 mg/dL — AB
RBC / HPF: >50 RBC/hpf — ABNORMAL HIGH (ref 0–5)
Specific Gravity, Urine: 1.017 (ref 1.005–1.030)
WBC, UA: >50 WBC/hpf — ABNORMAL HIGH (ref 0–5)
pH: 5 (ref 5.0–8.0)

## 2019-07-09 LAB — COMPREHENSIVE METABOLIC PANEL
ALT: 19 U/L (ref 0–44)
AST: 27 U/L (ref 15–41)
Albumin: 3.6 g/dL (ref 3.5–5.0)
Alkaline Phosphatase: 72 U/L (ref 38–126)
Anion gap: 8 (ref 5–15)
BUN: 17 mg/dL (ref 6–20)
CO2: 24 mmol/L (ref 22–32)
Calcium: 8.8 mg/dL — ABNORMAL LOW (ref 8.9–10.3)
Chloride: 105 mmol/L (ref 98–111)
Creatinine, Ser: 0.57 mg/dL (ref 0.44–1.00)
GFR calc Af Amer: >60 mL/min (ref >60)
GFR calc non Af Amer: >60 mL/min (ref >60)
Glucose, Bld: 90 mg/dL (ref 70–99)
Potassium: 4.1 mmol/L (ref 3.5–5.1)
Sodium: 137 mmol/L (ref 135–145)
Total Bilirubin: 0.6 mg/dL (ref 0.3–1.2)
Total Protein: 6.7 g/dL (ref 6.5–8.1)

## 2019-07-09 LAB — CBC WITH DIFFERENTIAL/PLATELET
Abs Immature Granulocytes: 0.04 10*3/uL (ref 0.00–0.07)
Basophils Absolute: 0.1 10*3/uL (ref 0.0–0.1)
Basophils Relative: 1 %
Eosinophils Absolute: 0.1 10*3/uL (ref 0.0–0.5)
Eosinophils Relative: 2 %
HCT: 38.2 % (ref 36.0–46.0)
Hemoglobin: 12.8 g/dL (ref 12.0–15.0)
Immature Granulocytes: 1 %
Lymphocytes Relative: 17 %
Lymphs Abs: 1.5 10*3/uL (ref 0.7–4.0)
MCH: 32.5 pg (ref 26.0–34.0)
MCHC: 33.5 g/dL (ref 30.0–36.0)
MCV: 97 fL (ref 80.0–100.0)
Monocytes Absolute: 0.6 10*3/uL (ref 0.1–1.0)
Monocytes Relative: 7 %
Neutro Abs: 6.5 10*3/uL (ref 1.7–7.7)
Neutrophils Relative %: 72 %
Platelets: 285 10*3/uL (ref 150–400)
RBC: 3.94 MIL/uL (ref 3.87–5.11)
RDW: 12.3 % (ref 11.5–15.5)
WBC: 8.9 10*3/uL (ref 4.0–10.5)
nRBC: 0 % (ref 0.0–0.2)

## 2019-07-09 LAB — LACTIC ACID, PLASMA: Lactic Acid, Venous: 1.2 mmol/L (ref 0.5–1.9)

## 2019-07-09 MED ORDER — IBUPROFEN 600 MG PO TABS
600.0000 mg | ORAL_TABLET | Freq: Three times a day (TID) | ORAL | 0 refills | Status: DC | PRN
Start: 2019-07-09 — End: 2019-12-08

## 2019-07-09 MED ORDER — PHENAZOPYRIDINE HCL 200 MG PO TABS
200.0000 mg | ORAL_TABLET | Freq: Three times a day (TID) | ORAL | 0 refills | Status: DC | PRN
Start: 2019-07-09 — End: 2019-07-09

## 2019-07-09 MED ORDER — PHENAZOPYRIDINE HCL 200 MG PO TABS
200.0000 mg | ORAL_TABLET | Freq: Three times a day (TID) | ORAL | 0 refills | Status: AC | PRN
Start: 2019-07-09 — End: 2019-07-12

## 2019-07-09 MED ORDER — CEFPODOXIME PROXETIL 200 MG PO TABS: 200.0000 mg | ORAL_TABLET | Freq: Two times a day (BID) | ORAL | 0 refills | Status: AC

## 2019-07-09 MED ORDER — MORPHINE SULFATE (PF) 4 MG/ML IV SOLN
4.0000 mg | Freq: Once | INTRAVENOUS | Status: AC
  Administered 2019-07-09: 4 mg via INTRAVENOUS
  Filled 2019-07-09: qty 1

## 2019-07-09 MED ORDER — SODIUM CHLORIDE 0.9 % IV BOLUS
1000.0000 mL | Freq: Once | INTRAVENOUS | Status: AC
  Administered 2019-07-09: 1000 mL via INTRAVENOUS

## 2019-07-09 MED ORDER — IBUPROFEN 600 MG PO TABS
600.0000 mg | ORAL_TABLET | Freq: Three times a day (TID) | ORAL | 0 refills | Status: DC | PRN
Start: 2019-07-09 — End: 2019-07-09

## 2019-07-09 MED ORDER — ONDANSETRON 4 MG PO TBDP
4.0000 mg | ORAL_TABLET | Freq: Three times a day (TID) | ORAL | 0 refills | Status: DC | PRN
Start: 2019-07-09 — End: 2019-07-09

## 2019-07-09 MED ORDER — SODIUM CHLORIDE 0.9 % IV SOLN
2.0000 g | Freq: Once | INTRAVENOUS | Status: AC
  Administered 2019-07-09: 2 g via INTRAVENOUS
  Filled 2019-07-09: qty 20

## 2019-07-09 MED ORDER — CEFPODOXIME PROXETIL 200 MG PO TABS: 200.0000 mg | ORAL_TABLET | Freq: Two times a day (BID) | ORAL | 0 refills | Status: DC

## 2019-07-09 MED ORDER — ONDANSETRON 4 MG PO TBDP
4.0000 mg | ORAL_TABLET | Freq: Three times a day (TID) | ORAL | 0 refills | Status: DC | PRN
Start: 2019-07-09 — End: 2019-07-15

## 2019-07-09 MED ORDER — ONDANSETRON HCL 4 MG/2ML IJ SOLN
4.0000 mg | Freq: Once | INTRAMUSCULAR | Status: AC
  Administered 2019-07-09: 4 mg via INTRAVENOUS
  Filled 2019-07-09: qty 2

## 2019-07-09 MED ORDER — FLUCONAZOLE 150 MG PO TABS
150.0000 mg | ORAL_TABLET | ORAL | 0 refills | Status: AC
Start: 2019-07-09 — End: 2019-07-13

## 2019-07-09 NOTE — ED Triage Notes (Addendum)
FIRST NURSE NOTE: Pt states she was here 6 weeks ago for bladder infection. Sxs not improved

## 2019-07-09 NOTE — ED Provider Notes (Signed)
[facility] Emergency Department Provider Note  ____________________________________________   First MD Initiated Contact with Patient 07/09/19 1111     (approximate)  I have reviewed the triage vital signs and the nursing notes.   HISTORY  Chief Complaint Dysuria    HPI Kristi Carter is a 59 y.o. female here with ongoing urinary symptoms.  The patient has had recurrent UTIs for the last 6 weeks.  She was first seen at the end of March here, for dysuria and flank pain.  CT at that time showed no stones but was positive for UTI.  She has since completed 2 courses, most recently Macrobid approximately week ago.  Over the last several days, she has noticed mild intermittent lower abdominal pain.  Over the last 24 hours, however, she has developed severe, sharp, stabbing, suprapubic pain.  The pain is worse with and after urination.  She is had some occasional radiation towards her flanks.  No nausea or vomiting.  No fevers or chills.  She denies history of recurrent UTIs prior to this.  No diarrhea or constipation.        Past Medical History:  Diagnosis Date  . A-fib ([place])   . Hypothyroid     There are no problems to display for this patient.   Past Surgical History:  Procedure Laterality Date  . CARDIAC ELECTROPHYSIOLOGY MAPPING AND ABLATION      Prior to Admission medications   Medication Sig Start Date End Date Taking? Authorizing Provider  atorvastatin (LIPITOR) 40 MG tablet  03/22/19   [provider]  cefpodoxime (VANTIN) 200 MG tablet Take 1 tablet (200 mg total) by mouth 2 (two) times daily for 10 days. 07/09/19 07/19/19  Duffy Bruce, MD  cephALEXin (KEFLEX) 500 MG capsule Take 1 capsule (500 mg total) by mouth 3 (three) times daily. 06/03/19   Fisher, Linden Dolin, PA-C  clonazePAM (KLONOPIN) 0.5 MG tablet Take 0.5 mg by mouth daily as needed. 05/09/19   [provider]  esomeprazole ([place]) 40 MG capsule  03/21/19   [provider]  fluconazole (DIFLUCAN) 150 MG tablet Take 1 tablet (150 mg total) by mouth every 3 (three) days for 2 doses. 07/09/19 07/13/19  Duffy Bruce, MD  ibuprofen (ADVIL) 600 MG tablet Take 1 tablet (600 mg total) by mouth every 8 (eight) hours as needed for moderate pain. 07/09/19   Duffy Bruce, MD  levothyroxine (SYNTHROID) 50 MCG tablet Take 50 mcg by mouth every morning. 05/26/19   [provider]  nitrofurantoin, macrocrystal-monohydrate, (MACROBID) 100 MG capsule Take 1 capsule (100 mg total) by mouth 2 (two) times daily. 06/28/19   Sharion Balloon, NP  ondansetron (ZOFRAN ODT) 4 MG disintegrating tablet Take 1 tablet (4 mg total) by mouth every 8 (eight) hours as needed for nausea or vomiting. 07/09/19   Duffy Bruce, MD  ondansetron (ZOFRAN) 4 MG tablet Take 1 tablet (4 mg total) by mouth daily as needed. 07/24/17   Schaevitz, Randall An, MD  phenazopyridine (PYRIDIUM) 200 MG tablet Take 1 tablet (200 mg total) by mouth 3 (three) times daily as needed for up to 3 days for pain. 07/09/19 07/12/19  Duffy Bruce, MD    Allergies Patient has no known allergies.  Family History  Problem Relation Age of Onset  . Healthy Father     Social History Social History   Tobacco Use  . Smoking status: Never Smoker  . Smokeless tobacco: Never Used  Substance Use Topics  .  Alcohol use: Yes  . Drug use: Never    Review of Systems  Review of Systems  Constitutional: Positive for fatigue. Negative for fever.  HENT: Negative for congestion and sore throat.   Eyes: Negative for visual disturbance.  Respiratory: Negative for cough and shortness of breath.   Cardiovascular: Negative for chest pain.  Gastrointestinal: Negative for abdominal pain, diarrhea, nausea and vomiting.  Genitourinary: Positive for dysuria, flank pain and frequency.  Musculoskeletal: Negative for back pain and neck pain.  Skin: Negative for rash and wound.  Neurological: Negative for weakness.  All  other systems reviewed and are negative.    ____________________________________________  PHYSICAL EXAM:      VITAL SIGNS: ED Triage Vitals  Enc Vitals Group     BP 07/09/19 1100 (!) 141/85     Pulse Rate 07/09/19 1100 85     Resp 07/09/19 1100 18     Temp 07/09/19 1100 97.8 F (36.6 C)     Temp Source 07/09/19 1100 Oral     SpO2 07/09/19 1100 98 %     Weight 07/09/19 1101 155 lb (70.3 kg)     Height 07/09/19 1101 5\' 3"  (1.6 m)     Head Circumference --      Peak Flow --      Pain Score 07/09/19 1101 8     Pain Loc --      Pain Edu? --      Excl. in [place]? --      Physical Exam Vitals and nursing note reviewed.  Constitutional:      General: She is not in acute distress.    Appearance: She is well-developed.  HENT:     Head: Normocephalic and atraumatic.  Eyes:     Conjunctiva/sclera: Conjunctivae normal.  Cardiovascular:     Rate and Rhythm: Normal rate and regular rhythm.     Heart sounds: Normal heart sounds.  Pulmonary:     Effort: Pulmonary effort is normal. No respiratory distress.     Breath sounds: No wheezing.  Abdominal:     General: There is no distension.     Tenderness: There is abdominal tenderness (Mild suprapubic).     Comments: No overt CVA tenderness bilaterally  Musculoskeletal:     Cervical back: Neck supple.  Skin:    General: Skin is warm.     Capillary Refill: Capillary refill takes less than 2 seconds.     Findings: No rash.  Neurological:     Mental Status: She is alert and oriented to person, place, and time.     Motor: No abnormal muscle tone.       ____________________________________________   LABS (all labs ordered are listed, but only abnormal results are displayed)  Labs Reviewed  COMPREHENSIVE METABOLIC PANEL - Abnormal; Notable for the following components:      Result Value   Calcium 8.8 (*)    All other components within normal limits  URINALYSIS, COMPLETE (UACMP) WITH MICROSCOPIC - Abnormal; Notable for the  following components:   Color, Urine YELLOW (*)    APPearance CLOUDY (*)    Hgb urine dipstick LARGE (*)    Protein, ur 30 (*)    Nitrite POSITIVE (*)    Leukocytes,Ua LARGE (*)    RBC / HPF >50 (*)    WBC, UA >50 (*)    Bacteria, UA MANY (*)    All other components within normal limits  URINE CULTURE  CBC WITH DIFFERENTIAL/PLATELET  LACTIC ACID, PLASMA  ____________________________________________  EKG:  ________________________________________  RADIOLOGY All imaging, including plain films, CT scans, and ultrasounds, independently reviewed by me, and interpretations confirmed via formal radiology reads.  ED MD interpretation:   Ultrasound renal: No hydronephrosis  Official radiology report(s): US Renal  Result Date: 07/09/2019 CLINICAL DATA:  Pyelonephritis EXAM: RENAL / URINARY TRACT ULTRASOUND COMPLETE COMPARISON:  None. FINDINGS: Right Kidney: Renal measurements: 10.5 x 4.5 x 3.8 cm = volume: 95 mL . Echogenicity within normal limits. No mass or hydronephrosis visualized. Left Kidney: Renal measurements: 10.1 x 4.4 x 4.9 cm = volume: 115 mL. Echogenicity is within normal limits. No mass or hydronephrosis visualized. Bladder: Appears normal for degree of bladder distention. Other: None. IMPRESSION: No hydronephrosis. Electronically Signed   By: Macy Mis M.D.   On: 07/09/2019 13:10    ____________________________________________  PROCEDURES   Procedure(s) performed (including Critical Care):  Procedures  ____________________________________________  INITIAL IMPRESSION / MDM / [place] / ED COURSE  As part of my medical decision making, I reviewed the following data within the [place] notes reviewed and incorporated, Old chart reviewed, Notes from prior ED visits, and [place] Controlled Substance Database       *ISSABELLE SCHENKEL was evaluated in Emergency Department on 07/09/2019 for the symptoms described in the history of  present illness. She was evaluated in the context of the global COVID-19 pandemic, which necessitated consideration that the patient might be at risk for infection with the SARS-CoV-2 virus that causes COVID-19. Institutional protocols and algorithms that pertain to the evaluation of patients at risk for COVID-19 are in a state of rapid change based on information released by regulatory bodies including the CDC and federal and state organizations. These policies and algorithms were followed during the patient's care in the ED.  Some ED evaluations and interventions may be delayed as a result of limited staffing during the pandemic.*     Medical Decision Making:  59 yo F here with dysuria and flank pain likely 2/2 recurrent UTI.. Interestingly, patient has had recurrent UTI for the last approximately 6 weeks - unclear if this is related to partially treated pyelonephritis, versus possibly an underlying GU issue. No vaginal discharge or signs of PID. Renal ultrasound obtained and shows no evidence of abscess or hydronephrosis. Do not suspect infected renal stone. WBC normal, renal function normal. No signs of sepsis. Reviewed prior culture results, which show sensitive E. Coli. She recently finished macrobid which may not cover pyelo, so will broaden to vantin for a longer course with supportive care. Will refer to Urology for further evaluation. Return precautions given.    ____________________________________________  FINAL CLINICAL IMPRESSION(S) / ED DIAGNOSES  Final diagnoses:  Recurrent UTI  Pyelonephritis     MEDICATIONS GIVEN DURING THIS VISIT:  Medications  sodium chloride 0.9 % bolus 1,000 mL (0 mLs Intravenous Stopped 07/09/19 1410)  cefTRIAXone (ROCEPHIN) 2 g in sodium chloride 0.9 % 100 mL IVPB (0 g Intravenous Stopped 07/09/19 1220)  morphine 4 MG/ML injection 4 mg (4 mg Intravenous Given 07/09/19 1144)  ondansetron (ZOFRAN) injection 4 mg (4 mg Intravenous Given 07/09/19 1144)     ED  Discharge Orders         Ordered    cefpodoxime (VANTIN) 200 MG tablet  2 times daily,   Status:  Discontinued     07/09/19 1355    ondansetron (ZOFRAN ODT) 4 MG disintegrating tablet  Every 8 hours PRN,   Status:  Discontinued  07/09/19 1355    phenazopyridine (PYRIDIUM) 200 MG tablet  3 times daily PRN,   Status:  Discontinued     07/09/19 1355    ibuprofen (ADVIL) 600 MG tablet  Every 8 hours PRN,   Status:  Discontinued     07/09/19 1356    cefpodoxime (VANTIN) 200 MG tablet  2 times daily     07/09/19 1414    ibuprofen (ADVIL) 600 MG tablet  Every 8 hours PRN     07/09/19 1414    ondansetron (ZOFRAN ODT) 4 MG disintegrating tablet  Every 8 hours PRN     07/09/19 1414    phenazopyridine (PYRIDIUM) 200 MG tablet  3 times daily PRN     07/09/19 1414    fluconazole (DIFLUCAN) 150 MG tablet  every 72 hours     07/09/19 1555           Note:  This document was prepared using Dragon voice recognition software and may include unintentional dictation errors.   Duffy Bruce, MD 07/09/19 2024

## 2019-07-09 NOTE — ED Notes (Signed)
[place] at bedside. Family at bedside.

## 2019-07-09 NOTE — Discharge Instructions (Addendum)
I'd recommend discussing your recurrent UTIs with your primary doctor  I have provided a number for a Urologist above  Take the full course of antibiotic

## 2019-07-09 NOTE — ED Triage Notes (Signed)
Pt has been seen X 2 in last 6 weeks for UTI.  Finished abx and then sx return. Has not seen urology.  Here today for pressure in back and dysuria. Also has pressure in bladders. Finished most recent abx last Wednesday. No fevers.

## 2019-07-11 LAB — URINE CULTURE
Culture: >=100000 — AB
Special Requests: NORMAL

## 2019-07-15 ENCOUNTER — Encounter: Payer: Self-pay | Admitting: Adult Health

## 2019-07-15 ENCOUNTER — Other Ambulatory Visit: Payer: Self-pay

## 2019-07-15 ENCOUNTER — Ambulatory Visit
Admission: RE | Admit: 2019-07-15 | Discharge: 2019-07-15 | Disposition: A | Discharge status: Home / Self Care | Payer: No Typology Code available for payment source | Source: Ambulatory Visit | Attending: Adult Health | Admitting: Adult Health

## 2019-07-15 ENCOUNTER — Ambulatory Visit (INDEPENDENT_AMBULATORY_CARE_PROVIDER_SITE_OTHER): Payer: No Typology Code available for payment source | Admitting: Adult Health

## 2019-07-15 DIAGNOSIS — N3289 Other specified disorders of bladder: Secondary | ICD-10-CM | POA: Diagnosis not present

## 2019-07-15 DIAGNOSIS — Z13220 Encounter for screening for lipoid disorders: Secondary | ICD-10-CM

## 2019-07-15 DIAGNOSIS — Z13820 Encounter for screening for osteoporosis: Secondary | ICD-10-CM

## 2019-07-15 DIAGNOSIS — R140 Abdominal distension (gaseous): Secondary | ICD-10-CM | POA: Insufficient documentation

## 2019-07-15 DIAGNOSIS — M899 Disorder of bone, unspecified: Secondary | ICD-10-CM

## 2019-07-15 DIAGNOSIS — Z1211 Encounter for screening for malignant neoplasm of colon: Secondary | ICD-10-CM | POA: Diagnosis not present

## 2019-07-15 DIAGNOSIS — Z9882 Breast implant status: Secondary | ICD-10-CM

## 2019-07-15 DIAGNOSIS — R5383 Other fatigue: Secondary | ICD-10-CM | POA: Insufficient documentation

## 2019-07-15 DIAGNOSIS — N83201 Unspecified ovarian cyst, right side: Secondary | ICD-10-CM

## 2019-07-15 DIAGNOSIS — Z126 Encounter for screening for malignant neoplasm of bladder: Secondary | ICD-10-CM | POA: Diagnosis not present

## 2019-07-15 DIAGNOSIS — Z1231 Encounter for screening mammogram for malignant neoplasm of breast: Secondary | ICD-10-CM | POA: Diagnosis not present

## 2019-07-15 DIAGNOSIS — N390 Urinary tract infection, site not specified: Secondary | ICD-10-CM | POA: Insufficient documentation

## 2019-07-15 DIAGNOSIS — K921 Melena: Secondary | ICD-10-CM

## 2019-07-15 DIAGNOSIS — Z0000 Encounter for general adult medical examination without abnormal findings: Secondary | ICD-10-CM | POA: Insufficient documentation

## 2019-07-15 LAB — POCT URINALYSIS DIPSTICK
Bilirubin, UA: NEGATIVE
Blood, UA: NEGATIVE
Glucose, UA: NEGATIVE
Ketones, UA: NEGATIVE
Leukocytes, UA: NEGATIVE
Nitrite, UA: NEGATIVE
Protein, UA: NEGATIVE
Spec Grav, UA: 1.025 (ref 1.010–1.025)
Urobilinogen, UA: 0.2 E.U./dL
pH, UA: 6 (ref 5.0–8.0)

## 2019-07-15 NOTE — Progress Notes (Signed)
New patient visit   Patient: Kristi Carter   DOB: 02-27-1961   59 y.o. Female  MRN: [id number] Visit Date: 07/15/2019  Today's healthcare provider: Marcille Buffy, FNP   No chief complaint on file.  Subjective    Kristi Carter is a 59 y.o. female who presents today as a new patient to establish care.  HPI  Patient comes to our office from [facility] to establish care, patient states that she had concerns that she would like to address today.   Patient reports for the past 7 weeks she has been "in and out" of hospital for recurrent urinary infections, patient states most recent visit was 07/09/19. Patient reports when she was at ED they did a CT of her back and she says it was to rule out kidney stones, patient would like to discuss with provider today about CT scan and referral. Patient reports that she has a follow up appointment with urologist in one week.     She had blood in urine 7 weeks ago.  She does report back pain- lumbar spine.   She was treated again.   07/09/2019 urine culture - E coli  06/28/19 was e - coli  06/03/2019 she had negative urine.   Denies any STD 's concerns. Denies any vaginal pressure, lesions.   IMPRESSION: 1. Mild urinary bladder wall thickening with perivesicular fat stranding, suggestive of cystitis. Correlation with urinalysis is recommended. 2. Evaluation for pyelonephritis is limited on non-contrasted study. 3. 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 4. There is a 10 mm lucent bone lesion within the posterior aspect of the right ilium. Findings are nonspecific and could represent a fibro-osseous lesion. Differential also includes metastatic disease and myeloma.   Electronically Signed   By: Davina Poke D.O.   On: 06/03/2019 11:43   Denies any loss of bowel or bladder control. She deniues any numbness or tingling. Denies any saddle paresthesia.  Denies saddle paresthesias.  Denies radiculopathy/ paresthesias.  Patient   denies any fever, body aches,chills, rash, chest pain, shortness of breath, nausea, vomiting, or diarrhea.    Colonoscopy was last year - has polyps - repeat in 5 years she said. Need to get records.   Past Medical History:  Diagnosis Date  . A-fib ([place])   . Hypothyroid    Past Surgical History:  Procedure Laterality Date  . CARDIAC ELECTROPHYSIOLOGY MAPPING AND ABLATION     Family Status  Relation Name Status  . Mother unknown Deceased  . Father  Alive   Family History  Problem Relation Age of Onset  . Healthy Father    Social History   Socioeconomic History  . Marital status: Married    Spouse name: Not on file  . Number of children: Not on file  . Years of education: Not on file  . Highest education level: Not on file  Occupational History  . Not on file  Tobacco Use  . Smoking status: Never Smoker  . Smokeless tobacco: Never Used  Substance and Sexual Activity  . Alcohol use: Yes  . Drug use: Never  . Sexual activity: Not on file  Other Topics Concern  . Not on file  Social History Narrative  . Not on file   Social Determinants of Health   Financial Resource Strain:   . Difficulty of Paying Living Expenses:   Food Insecurity:   . Worried About Charity fundraiser in the Last Year:   . [employer]  in the Last Year:   Transportation Needs:   . Film/video editor (Medical):   Marland Kitchen Lack of Transportation (Non-Medical):   Physical Activity:   . Days of Exercise per Week:   . Minutes of Exercise per Session:   Stress:   . Feeling of Stress :   Social Connections:   . Frequency of Communication with Friends and Family:   . Frequency of Social Gatherings with Friends and Family:   . Attends Religious Services:   . Active Member of Clubs or Organizations:   . Attends Archivist Meetings:   Marland Kitchen Marital Status:    Outpatient Medications Prior to Visit  Medication Sig  . atorvastatin (LIPITOR) 40 MG tablet   . cefpodoxime (VANTIN) 200 MG  tablet Take 1 tablet (200 mg total) by mouth 2 (two) times daily for 10 days.  . cephALEXin (KEFLEX) 500 MG capsule Take 1 capsule (500 mg total) by mouth 3 (three) times daily.  . clonazePAM (KLONOPIN) 0.5 MG tablet Take 0.5 mg by mouth daily as needed.  Marland Kitchen esomeprazole (NEXIUM) 40 MG capsule   . ibuprofen (ADVIL) 600 MG tablet Take 1 tablet (600 mg total) by mouth every 8 (eight) hours as needed for moderate pain.  Marland Kitchen levothyroxine (SYNTHROID) 50 MCG tablet Take 50 mcg by mouth every morning.  . nitrofurantoin, macrocrystal-monohydrate, (MACROBID) 100 MG capsule Take 1 capsule (100 mg total) by mouth 2 (two) times daily.  . ondansetron (ZOFRAN ODT) 4 MG disintegrating tablet Take 1 tablet (4 mg total) by mouth every 8 (eight) hours as needed for nausea or vomiting.  . ondansetron (ZOFRAN) 4 MG tablet Take 1 tablet (4 mg total) by mouth daily as needed.   No facility-administered medications prior to visit.   Allergies  Allergen Reactions  . Prednisone     Other reaction(s): Vomiting  . Tape      There is no immunization history on file for this patient.  Health Maintenance  Topic Date Due  . Hepatitis C Screening  Never done  . HIV Screening  Never done  . PAP SMEAR-Modifier  Never done  . MAMMOGRAM  Never done  . COLONOSCOPY  Never done  . INFLUENZA VACCINE  10/09/2019  . TETANUS/TDAP  09/21/2024    Patient Care Team: [place] as PCP - General  Review of Systems  Constitutional: Positive for activity change, chills, diaphoresis and fatigue. Negative for appetite change, fever and unexpected weight change.       Resolved now chills and fever  , still has some fatigue. Currently finishing antibiotics for UTI e-coli   HENT: Negative.   Eyes: Negative for photophobia and visual disturbance.  Respiratory: Negative for apnea, cough, choking, chest tightness, shortness of breath, wheezing and stridor.   Gastrointestinal: Positive for abdominal distention,  abdominal pain, blood in stool (occasional denies hemmorhoid ) and constipation. Negative for anal bleeding, diarrhea, nausea, rectal pain and vomiting.  Genitourinary: Positive for frequency (improving and no longer using Azo ). Negative for decreased urine volume, difficulty urinating, dyspareunia, dysuria, enuresis, flank pain, genital sores, hematuria, menstrual problem, pelvic pain, urgency, vaginal bleeding, vaginal discharge and vaginal pain.  Musculoskeletal: Positive for back pain. Negative for arthralgias, gait problem, joint swelling, myalgias, neck pain and neck stiffness.  Skin: Negative.   Allergic/Immunologic: Positive for environmental allergies.  Neurological: Negative.   Hematological: Negative.   Psychiatric/Behavioral: Negative.     Last CBC Lab Results  Component Value Date   WBC 8.9 07/09/2019  HGB 12.8 07/09/2019   HCT 38.2 07/09/2019   MCV 97.0 07/09/2019   MCH 32.5 07/09/2019   RDW 12.3 07/09/2019   PLT 285 09/98/3382   Last metabolic panel Lab Results  Component Value Date   GLUCOSE 90 07/09/2019   NA 137 07/09/2019   K 4.1 07/09/2019   CL 105 07/09/2019   CO2 24 07/09/2019   BUN 17 07/09/2019   CREATININE 0.57 07/09/2019   GFRNONAA >60 07/09/2019   GFRAA >60 07/09/2019   CALCIUM 8.8 (L) 07/09/2019   PROT 6.7 07/09/2019   ALBUMIN 3.6 07/09/2019   BILITOT 0.6 07/09/2019   ALKPHOS 72 07/09/2019   AST 27 07/09/2019   ALT 19 07/09/2019   ANIONGAP 8 07/09/2019   Last lipids No results found for: CHOL, HDL, LDLCALC, LDLDIRECT, TRIG, CHOLHDL Last hemoglobin A1c No results found for: HGBA1C Last thyroid functions No results found for: TSH, T3TOTAL, T4TOTAL, THYROIDAB Last vitamin D No results found for: 25OHVITD2, 25OHVITD3, VD25OH Last vitamin B12 and Folate No results found for: VITAMINB12, FOLATE    Objective    Physical Exam Vitals reviewed.  Constitutional:      General: She is not in acute distress.    Appearance: She is  well-developed. She is not diaphoretic.     Interventions: She is not intubated. HENT:     Head: Normocephalic and atraumatic.     Right Ear: External ear normal.     Left Ear: External ear normal.     Nose: Nose normal.     Mouth/Throat:     Pharynx: No oropharyngeal exudate.  Eyes:     General: Lids are normal. No scleral icterus.       Right eye: No discharge.        Left eye: No discharge.     Conjunctiva/sclera: Conjunctivae normal.     Right eye: Right conjunctiva is not injected. No exudate or hemorrhage.    Left eye: Left conjunctiva is not injected. No exudate or hemorrhage.    Pupils: Pupils are equal, round, and reactive to light.  Neck:     Thyroid: No thyroid mass or thyromegaly.     Vascular: Normal carotid pulses. No carotid bruit, hepatojugular reflux or JVD.     Trachea: Trachea and phonation normal. No tracheal tenderness or tracheal deviation.     Meningeal: Brudzinski's sign and Kernig's sign absent.  Cardiovascular:     Rate and Rhythm: Normal rate and regular rhythm.     Pulses: Normal pulses.          Radial pulses are 2+ on the right side and 2+ on the left side.       Dorsalis pedis pulses are 2+ on the right side and 2+ on the left side.       Posterior tibial pulses are 2+ on the right side and 2+ on the left side.     Heart sounds: Normal heart sounds, S1 normal and S2 normal. Heart sounds not distant. No murmur. No friction rub. No gallop.   Pulmonary:     Effort: Pulmonary effort is normal. No tachypnea, bradypnea, accessory muscle usage or respiratory distress. She is not intubated.     Breath sounds: Normal breath sounds. No stridor. No wheezing or rales.  Chest:     Chest wall: No tenderness.  Abdominal:     General: Bowel sounds are normal. There is no distension or abdominal bruit.     Palpations: Abdomen is soft. There is no shifting dullness, fluid  wave, hepatomegaly, splenomegaly, mass or pulsatile mass.     Tenderness: There is no abdominal  tenderness. There is no guarding or rebound.     Hernia: No hernia is present.  Genitourinary:    Comments: deferred to gynecology she prefers  Musculoskeletal:        General: No deformity. Normal range of motion.     Cervical back: Full passive range of motion without pain, normal range of motion and neck supple. No edema, erythema or rigidity. No spinous process tenderness or muscular tenderness. Normal range of motion.     Thoracic back: Normal.     Lumbar back: Tenderness and bony tenderness present. Normal range of motion.       Back:     Right hip: Tenderness and bony tenderness present. No deformity. Normal range of motion. Normal strength.     Left hip: Tenderness and bony tenderness present. No deformity. Normal range of motion. Normal strength.     Comments: Bilateral lower lumbar sacral spinal tenderness on exam as well as tenderness at ileum bilaterally.   Lymphadenopathy:     Head:     Right side of head: No submental, submandibular, tonsillar, preauricular, posterior auricular or occipital adenopathy.     Left side of head: No submental, submandibular, tonsillar, preauricular, posterior auricular or occipital adenopathy.     Cervical: No cervical adenopathy.     Right cervical: No superficial, deep or posterior cervical adenopathy.    Left cervical: No superficial, deep or posterior cervical adenopathy.     Upper Body:     Right upper body: No supraclavicular or pectoral adenopathy.     Left upper body: No supraclavicular or pectoral adenopathy.  Skin:    General: Skin is warm and dry.     Capillary Refill: Capillary refill takes less than 2 seconds.     Coloration: Skin is not pale.     Findings: No abrasion, bruising, burn, ecchymosis, erythema, lesion, petechiae or rash.     Nails: There is no clubbing.  Neurological:     Mental Status: She is alert and oriented to person, place, and time.     GCS: GCS eye subscore is 4. GCS verbal subscore is 5. GCS motor subscore  is 6.     Cranial Nerves: No cranial nerve deficit.     Sensory: No sensory deficit.     Motor: No tremor, atrophy, abnormal muscle tone or seizure activity.     Coordination: Coordination normal.     Gait: Gait normal.     Deep Tendon Reflexes: Reflexes are normal and symmetric. Reflexes normal. Babinski sign absent on the right side. Babinski sign absent on the left side.     Reflex Scores:      Tricep reflexes are 2+ on the right side and 2+ on the left side.      Bicep reflexes are 2+ on the right side and 2+ on the left side.      Brachioradialis reflexes are 2+ on the right side and 2+ on the left side.      Patellar reflexes are 2+ on the right side and 2+ on the left side.      Achilles reflexes are 2+ on the right side and 2+ on the left side. Psychiatric:        Speech: Speech normal.        Behavior: Behavior normal.        Thought Content: Thought content normal.  Judgment: Judgment normal.   pale skin.   Patient appers well, not sickly. Speaking in complete sentences. Patient moves on and off of exam table and in room without difficulty. Gait is normal in hall and in room. Patient is oriented to person place time and situation. Patient answers questions appropriately and engages eye contact and verbal dialect with provider.  Depression Screen No flowsheet data found. No results found for any visits on 07/15/19.  Assessment & Plan      1. Routine health maintenance - Ambulatory referral to Gynecology - Ambulatory referral to Gastroenterology  2. Encounter for screening mammogram for malignant neoplasm of breast  3. Bladder wall thickening  4. Bone lesion - Multiple Myeloma Panel (SPEP&IFE w/QIG) - TSH - Rheumatoid factor - ANA - Sed Rate (ESR) - DG Hip Unilat W OR W/O Pelvis 2-3 Views Left; Future - DG Lumbar Spine Complete; Future - VITAMIN D 25 Hydroxy (Vit-D Deficiency, Fractures) - DG Hip Unilat W OR W/O Pelvis 2-3 Views Right; Future  5. Screening  for malignant neoplasm of colon - MM Digital Screening; Future  6. H/O bilateral breast implants - MM Digital Screening; Future  7. Cyst of right ovary- dermoid seen on CT 07/09/2019  - Ambulatory referral to Gynecology  8. Screening cholesterol level - Lipid Panel w/o Chol/HDL Ratio  9. Other fatigue - Multiple Myeloma Panel (SPEP&IFE w/QIG) - CBC with Differential/Platelet - Comprehensive Metabolic Panel (CMET)  10. Screening, malignant neoplasm, bladder - POCT urinalysis dipstick  11. Recurrent UTI (urinary tract infection) - POCT urinalysis dipstick  12. Abdominal bloating - Ambulatory referral to Gynecology - Ambulatory referral to Gastroenterology history of rectal bleeding.   Orders Placed This Encounter  Procedures  . MM Digital Screening  . DG Hip Unilat W OR W/O Pelvis 2-3 Views Left  . DG Lumbar Spine Complete  . DG Hip Unilat W OR W/O Pelvis 2-3 Views Right  . DG Bone Density  . Multiple Myeloma Panel (SPEP&IFE w/QIG)  . CBC with Differential/Platelet  . Comprehensive Metabolic Panel (CMET)  . TSH  . Rheumatoid factor  . ANA  . Sed Rate (ESR)  . Lipid Panel w/o Chol/HDL Ratio  . VITAMIN D 25 Hydroxy (Vit-D Deficiency, Fractures)  . Ambulatory referral to Gynecology  . Ambulatory referral to Gastroenterology  . POCT urinalysis dipstick   Results for orders placed or performed in visit on 07/15/19 (from the past 72 hour(s))  POCT urinalysis dipstick     Status: Normal   Collection Time: 07/15/19  2:35 PM  Result Value Ref Range   Color, UA yellow    Clarity, UA clear    Glucose, UA Negative Negative   Bilirubin, UA negative    Ketones, UA negative    Spec Grav, UA 1.025 1.010 - 1.025   Blood, UA negative    pH, UA 6.0 5.0 - 8.0   Protein, UA Negative Negative   Urobilinogen, UA 0.2 0.2 or 1.0 E.U./dL   Nitrite, UA negative    Leukocytes, UA Negative Negative   Appearance     Odor      Concerned with CT renal findings of possible bone lesion  and tenderness on exam- will x ray hips and lumbar spine and labs today- proceed with additional screenings and imaging referrals as needed. Call if worsening at anytime.   Abdominal bloating, has had colonoscopy- request records- would liek to see GI again and was told to repeat colonoscopy in 5 years, and she reports it has not  been that long.  Call [facility] to set up screening mammogram.   Keep urology appointment. complete antibiotics for UTI, urine clean today.  Should hear from above referrals within 2 weeks and if you do not please contact the office immediately.  Return in about 1 month (around 08/15/2019), or if symptoms worsen or fail to improve, for at any time for any worsening symptoms, Go to Emergency room/ urgent care if worse.  Advised patient call the office or your primary care doctor for an appointment if no improvement within 72 hours or if any symptoms change or worsen at any time  Advised ER or urgent Care if after hours or on weekend. Call 911 for emergency symptoms at any time.Patinet verbalized understanding of all instructions given/reviewed and treatment plan and has no further questions or concerns at this time.      An After Visit Summary was printed and given to the patient.    IWellington Hampshire Emauri Krygier, FNP, have reviewed all documentation for this visit. The documentation on 07/15/19 for the exam, diagnosis, procedures, and orders are all accurate and complete.  Addressed extensive list of chronic and acute medical problems today requiring 60 minutes reviewing her medical record, counseling patient regarding her conditions and coordination of care.     Marcille Buffy, [place] [phone] (phone) [phone] (fax)  [place]

## 2019-07-15 NOTE — Patient Instructions (Addendum)
Wellness exam was performed in office today as well as other acute issues reviewed and managed.   Nice to meet you !  Call to schedule your screening mammogram. Your orders have been placed for your exam.  Let our office know if you have questions, concerns, or any difficulty scheduling.  If normal results then yearly screening mammograms are recommended unless you notice  Changes in your breast then you should schedule a follow up office visit. If abnormal results  Further imaging will be warranted and sooner follow up as determined by the radiologist at the [facility].   [facility] [place],  [id number]  Main: [phone]  Breast Self-Awareness Breast self-awareness is knowing how your breasts look and feel. Doing breast self-awareness is important. It allows you to catch a breast problem early while it is still small and can be treated. All women should do breast self-awareness, including women who have had breast implants. Tell your doctor if you notice a change in your breasts. What you need:  A mirror.  A well-lit room. How to do a breast self-exam A breast self-exam is one way to learn what is normal for your breasts and to check for changes. To do a breast self-exam: Look for changes  1. Take off all the clothes above your waist. 2. Stand in front of a mirror in a room with good lighting. 3. Put your hands on your hips. 4. Push your hands down. 5. Look at your breasts and nipples in the mirror to see if one breast or nipple looks different from the other. Check to see if: ? The shape of one breast is different. ? The size of one breast is different. ? There are wrinkles, dips, and bumps in one breast and not the other. 6. Look at each breast for changes in the skin, such as: ? Redness. ? Scaly areas. 7. Look for changes in your nipples, such as: ? Liquid around the  nipples. ? Bleeding. ? Dimpling. ? Redness. ? A change in where the nipples are. Feel for changes  1. Lie on your back on the floor. 2. Feel each breast. To do this, follow these steps: ? Pick a breast to feel. ? Put the arm closest to that breast above your head. ? Use your other arm to feel the nipple area of your breast. Feel the area with the pads of your three middle fingers by making small circles with your fingers. For the first circle, press lightly. For the second circle, press harder. For the third circle, press even harder. ? Keep making circles with your fingers at the different pressures as you move down your breast. Stop when you feel your ribs. ? Move your fingers a little toward the center of your body. ? Start making circles with your fingers again, this time going up until you reach your collarbone. ? Keep making up-and-down circles until you reach your armpit. Remember to keep using the three pressures. ? Feel the other breast in the same way. 3. Sit or stand in the tub or shower. 4. With soapy water on your skin, feel each breast the same way you did in step 2 when you were lying on the floor. Write down what you find Writing down what you find can help you remember what to tell your doctor. Write down:  What is normal for each breast.  Any changes you find in each breast, including: ? The  kind of changes you find. ? Whether you have pain. ? Size and location of any lumps.  When you last had your menstrual period. General tips  Check your breasts every month.  If you are breastfeeding, the best time to check your breasts is after you feed your baby or after you use a breast pump.  If you get menstrual periods, the best time to check your breasts is 5-7 days after your menstrual period is over.  With time, you will become comfortable with the self-exam, and you will begin to know if there are changes in your breasts. Contact a doctor if you:  See a change  in the shape or size of your breasts or nipples.  See a change in the skin of your breast or nipples, such as red or scaly skin.  Have fluid coming from your nipples that is not normal.  Find a lump or thick area that was not there before.  Have pain in your breasts.  Have any concerns about your breast health. Summary  Breast self-awareness includes looking for changes in your breasts, as well as feeling for changes within your breasts.  Breast self-awareness should be done in front of a mirror in a well-lit room.  You should check your breasts every month. If you get menstrual periods, the best time to check your breasts is 5-7 days after your menstrual period is over.  Let your doctor know of any changes you see in your breasts, including changes in size, changes on the skin, pain or tenderness, or fluid from your nipples that is not normal. This information is not intended to replace advice given to you by your health care provider. Make sure you discuss any questions you have with your health care provider. Document Revised: 10/13/2017 Document Reviewed: 10/13/2017 Elsevier Patient Education  [place] Maintenance, Female Adopting a healthy lifestyle and getting preventive care are important in promoting health and wellness. Ask your health care provider about:  The right schedule for you to have regular tests and exams.  Things you can do on your own to prevent diseases and keep yourself healthy. What should I know about diet, weight, and exercise? Eat a healthy diet   Eat a diet that includes plenty of vegetables, fruits, low-fat dairy products, and lean protein.  Do not eat a lot of foods that are high in solid fats, added sugars, or sodium. Maintain a healthy weight Body mass index (BMI) is used to identify weight problems. It estimates body fat based on height and weight. Your health care provider can help determine your BMI and help you achieve or  maintain a healthy weight. Get regular exercise Get regular exercise. This is one of the most important things you can do for your health. Most adults should:  Exercise for at least 150 minutes each week. The exercise should increase your heart rate and make you sweat (moderate-intensity exercise).  Do strengthening exercises at least twice a week. This is in addition to the moderate-intensity exercise.  Spend less time sitting. Even light physical activity can be beneficial. Watch cholesterol and blood lipids Have your blood tested for lipids and cholesterol at 57 years of age, then have this test every 5 years. Have your cholesterol levels checked more often if:  Your lipid or cholesterol levels are high.  You are older than 57 years of age.  You are at high risk for heart disease. What should I know about cancer screening? Depending on  your health history and family history, you may need to have cancer screening at various ages. This may include screening for:  Breast cancer.  Cervical cancer.  Colorectal cancer.  Skin cancer.  Lung cancer. What should I know about heart disease, diabetes, and high blood pressure? Blood pressure and heart disease  High blood pressure causes heart disease and increases the risk of stroke. This is more likely to develop in people who have high blood pressure readings, are of African descent, or are overweight.  Have your blood pressure checked: ? Every 3-5 years if you are 34-40 years of age. ? Every year if you are 6 years old or older. Diabetes Have regular diabetes screenings. This checks your fasting blood sugar level. Have the screening done:  Once every three years after age 51 if you are at a normal weight and have a low risk for diabetes.  More often and at a younger age if you are overweight or have a high risk for diabetes. What should I know about preventing infection? Hepatitis B If you have a higher risk for hepatitis B,  you should be screened for this virus. Talk with your health care provider to find out if you are at risk for hepatitis B infection. Hepatitis C Testing is recommended for:  Everyone born from [birth year] through 1963.  Anyone with known risk factors for hepatitis C. Sexually transmitted infections (STIs)  Get screened for STIs, including gonorrhea and chlamydia, if: ? You are sexually active and are younger than 57 years of age. ? You are older than 57 years of age and your health care provider tells you that you are at risk for this type of infection. ? Your sexual activity has changed since you were last screened, and you are at increased risk for chlamydia or gonorrhea. Ask your health care provider if you are at risk.  Ask your health care provider about whether you are at high risk for HIV. Your health care provider may recommend a prescription medicine to help prevent HIV infection. If you choose to take medicine to prevent HIV, you should first get tested for HIV. You should then be tested every 3 months for as long as you are taking the medicine. Pregnancy  If you are about to stop having your period (premenopausal) and you may become pregnant, seek counseling before you get pregnant.  Take 400 to 800 micrograms (mcg) of folic acid every day if you become pregnant.  Ask for birth control (contraception) if you want to prevent pregnancy. Osteoporosis and menopause Osteoporosis is a disease in which the bones lose minerals and strength with aging. This can result in bone fractures. If you are 52 years old or older, or if you are at risk for osteoporosis and fractures, ask your health care provider if you should:  Be screened for bone loss.  Take a calcium or vitamin D supplement to lower your risk of fractures.  Be given hormone replacement therapy (HRT) to treat symptoms of menopause. Follow these instructions at home: Lifestyle  Do not use any products that contain nicotine or  tobacco, such as cigarettes, e-cigarettes, and chewing tobacco. If you need help quitting, ask your health care provider.  Do not use street drugs.  Do not share needles.  Ask your health care provider for help if you need support or information about quitting drugs. Alcohol use  Do not drink alcohol if: ? Your health care provider tells you not to drink. ? You are  pregnant, may be pregnant, or are planning to become pregnant.  If you drink alcohol: ? Limit how much you use to 0-1 drink a day. ? Limit intake if you are breastfeeding.  Be aware of how much alcohol is in your drink. In the [place] one drink equals one 12 oz bottle of beer (355 mL), one 5 oz glass of wine (148 mL), or one 1 oz glass of hard liquor (44 mL). General instructions  Schedule regular health, dental, and eye exams.  Stay current with your vaccines.  Tell your health care provider if: ? You often feel depressed. ? You have ever been abused or do not feel safe at home. Summary  Adopting a healthy lifestyle and getting preventive care are important in promoting health and wellness.  Follow your health care provider's instructions about healthy diet, exercising, and getting tested or screened for diseases.  Follow your health care provider's instructions on monitoring your cholesterol and blood pressure. This information is not intended to replace advice given to you by your health care provider. Make sure you discuss any questions you have with your health care provider. Document Revised: 02/17/2018 Document Reviewed: 02/17/2018 Elsevier Patient Education  2020 [employer].

## 2019-07-18 NOTE — Progress Notes (Signed)
Right hip bone with bone lesion still visualized and radiologist recommends MRI with contrast. Left hip x ray normal. Lumbar spine shows minimal disc degenerative changes throughout greatest ar L2- L3  of lumbar spine  Please verify no contrast dye allergy or shellfish allergy ? Claustrophobia ? Pacemaker or other implantable device or metal in her body ? Prefer [place] for MRI ? I will place order once we have answers to above questions    IMPRESSION: 1. No acute osseous abnormality involving either hip. 2. The previously demonstrated lucent bone lesion in the posterior aspect of the right iliac bone is better visualized on the patient's prior CT. If further workup is desired, consider a contrast enhanced MRI for further evaluation.

## 2019-07-19 ENCOUNTER — Ambulatory Visit: Payer: Self-pay | Admitting: Urology

## 2019-07-19 ENCOUNTER — Other Ambulatory Visit: Payer: Self-pay | Admitting: Adult Health

## 2019-07-19 DIAGNOSIS — M4807 Spinal stenosis, lumbosacral region: Secondary | ICD-10-CM

## 2019-07-19 DIAGNOSIS — M899 Disorder of bone, unspecified: Secondary | ICD-10-CM

## 2019-07-19 DIAGNOSIS — N3289 Other specified disorders of bladder: Secondary | ICD-10-CM

## 2019-07-19 LAB — COMPREHENSIVE METABOLIC PANEL WITH GFR
ALT: 16 [IU]/L (ref 0–32)
AST: 34 [IU]/L (ref 0–40)
Albumin/Globulin Ratio: 1.5 (ref 1.2–2.2)
Albumin: 4.3 g/dL (ref 3.8–4.9)
Alkaline Phosphatase: 99 [IU]/L (ref 39–117)
BUN/Creatinine Ratio: 21 (ref 9–23)
BUN: 12 mg/dL (ref 6–24)
Bilirubin Total: 0.4 mg/dL (ref 0.0–1.2)
CO2: 23 mmol/L (ref 20–29)
Calcium: 9.4 mg/dL (ref 8.7–10.2)
Chloride: 102 mmol/L (ref 96–106)
Creatinine, Ser: 0.57 mg/dL (ref 0.57–1.00)
GFR calc Af Amer: 119 mL/min/{1.73_m2}
GFR calc non Af Amer: 103 mL/min/{1.73_m2}
Globulin, Total: 2.8 g/dL (ref 1.5–4.5)
Glucose: 79 mg/dL (ref 65–99)
Potassium: 4.4 mmol/L (ref 3.5–5.2)
Sodium: 138 mmol/L (ref 134–144)
Total Protein: 7.1 g/dL (ref 6.0–8.5)

## 2019-07-19 LAB — CBC WITH DIFFERENTIAL/PLATELET
Basophils Absolute: 0.1 10*3/uL (ref 0.0–0.2)
Basos: 1 %
EOS (ABSOLUTE): 0.2 10*3/uL (ref 0.0–0.4)
Eos: 3 %
Hematocrit: 40 % (ref 34.0–46.6)
Hemoglobin: 13.3 g/dL (ref 11.1–15.9)
Immature Grans (Abs): 0.1 10*3/uL (ref 0.0–0.1)
Immature Granulocytes: 1 %
Lymphocytes Absolute: 1.7 10*3/uL (ref 0.7–3.1)
Lymphs: 24 %
MCH: 32.1 pg (ref 26.6–33.0)
MCHC: 33.3 g/dL (ref 31.5–35.7)
MCV: 97 fL (ref 79–97)
Monocytes Absolute: 0.6 10*3/uL (ref 0.1–0.9)
Monocytes: 9 %
Neutrophils Absolute: 4.3 10*3/uL (ref 1.4–7.0)
Neutrophils: 62 %
Platelets: 359 10*3/uL (ref 150–450)
RBC: 4.14 x10E6/uL (ref 3.77–5.28)
RDW: 12.3 % (ref 11.7–15.4)
WBC: 6.9 10*3/uL (ref 3.4–10.8)

## 2019-07-19 LAB — MULTIPLE MYELOMA PANEL, SERUM
Albumin SerPl Elph-Mcnc: 3.8 g/dL (ref 2.9–4.4)
Albumin/Glob SerPl: 1.2 (ref 0.7–1.7)
Alpha 1: 0.2 g/dL (ref 0.0–0.4)
Alpha2 Glob SerPl Elph-Mcnc: 0.9 g/dL (ref 0.4–1.0)
B-Globulin SerPl Elph-Mcnc: 1.4 g/dL — ABNORMAL HIGH (ref 0.7–1.3)
Gamma Glob SerPl Elph-Mcnc: 0.9 g/dL (ref 0.4–1.8)
Globulin, Total: 3.3 g/dL (ref 2.2–3.9)
IgG (Immunoglobin G), Serum: 1015 mg/dL (ref 586–1602)
IgM (Immunoglobulin M), Srm: 110 mg/dL (ref 26–217)
Immunoglobulin A, (IgA) QN, Serum: 366 mg/dL — ABNORMAL HIGH (ref 87–352)

## 2019-07-19 LAB — LIPID PANEL W/O CHOL/HDL RATIO
Cholesterol, Total: 208 mg/dL — ABNORMAL HIGH (ref 100–199)
HDL: 68 mg/dL (ref >39)
LDL Chol Calc (NIH): 101 mg/dL — ABNORMAL HIGH (ref 0–99)
Triglycerides: 230 mg/dL — ABNORMAL HIGH (ref 0–149)
VLDL Cholesterol Cal: 39 mg/dL (ref 5–40)

## 2019-07-19 LAB — TSH: TSH: 1.91 u[IU]/mL (ref 0.450–4.500)

## 2019-07-19 LAB — SEDIMENTATION RATE: Sed Rate: 27 mm/h (ref 0–40)

## 2019-07-19 LAB — ANA: Anti Nuclear Antibody (ANA): NEGATIVE

## 2019-07-19 LAB — VITAMIN D 25 HYDROXY (VIT D DEFICIENCY, FRACTURES): Vit D, 25-Hydroxy: 32.2 ng/mL (ref 30.0–100.0)

## 2019-07-19 LAB — RHEUMATOID FACTOR: Rheumatoid fact SerPl-aCnc: <10 IU/mL (ref 0.0–13.9)

## 2019-07-19 NOTE — Progress Notes (Signed)
MRI ordered 07/19/19 patient should get a call within two weeks and if not please call the office.

## 2019-07-20 ENCOUNTER — Other Ambulatory Visit: Payer: Self-pay | Admitting: Adult Health

## 2019-07-20 ENCOUNTER — Telehealth: Payer: Self-pay | Admitting: Adult Health

## 2019-07-20 DIAGNOSIS — T3695XA Adverse effect of unspecified systemic antibiotic, initial encounter: Secondary | ICD-10-CM

## 2019-07-20 DIAGNOSIS — E782 Mixed hyperlipidemia: Secondary | ICD-10-CM

## 2019-07-20 DIAGNOSIS — B379 Candidiasis, unspecified: Secondary | ICD-10-CM

## 2019-07-20 DIAGNOSIS — E559 Vitamin D deficiency, unspecified: Secondary | ICD-10-CM

## 2019-07-20 DIAGNOSIS — M899 Disorder of bone, unspecified: Secondary | ICD-10-CM

## 2019-07-20 DIAGNOSIS — D759 Disease of blood and blood-forming organs, unspecified: Secondary | ICD-10-CM | POA: Insufficient documentation

## 2019-07-20 HISTORY — DX: Candidiasis, unspecified: B37.9

## 2019-07-20 MED ORDER — FLUCONAZOLE 150 MG PO TABS: 150.0000 mg | ORAL_TABLET | ORAL | 0 refills | Status: DC

## 2019-07-20 NOTE — Progress Notes (Signed)
Bone lesion- posterior right ilium  - Plan: Ambulatory referral to Hematology  Blood dyscrasia- IFE1 positive- multiple myeloma panel mild abnormalities  - Plan: Ambulatory referral to Hematology    Bone lesion- posterior right ilium  - Plan: Ambulatory referral to Hematology  Blood dyscrasia- IFE1 positive- multiple myeloma panel mild abnormalities  - Plan: Ambulatory referral to Hematology    Orders Placed This Encounter  Procedures  . CBC with Differential/Platelet    Standing Status:   Future    Standing Expiration Date:   07/19/2020  . Comprehensive Metabolic Panel (CMET)    Standing Status:   Future    Standing Expiration Date:   07/19/2020  . VITAMIN D 25 Hydroxy (Vit-D Deficiency, Fractures)    Standing Status:   Future    Standing Expiration Date:   07/19/2020  . Lipid Panel w/o Chol/HDL Ratio    Standing Status:   Future    Standing Expiration Date:   07/19/2020  . Ambulatory referral to Hematology    Referral Priority:   Routine    Referral Type:   Consultation    Referral Reason:   Specialty Services Required    Referred to Provider:   Cammie Sickle, MD    Requested Specialty:   Oncology    Number of Visits Requested:   1

## 2019-07-20 NOTE — Telephone Encounter (Signed)
Okay for me to approve prescription request below? KW

## 2019-07-20 NOTE — Progress Notes (Signed)
Provider added on recheck Vitamin D, cbc and CMP  and lipid panel in 6 months. Provider called patient.    Provider called patient to discuss results 07/20/2019 at 920 am. Patient verbalized understanding of all instructions given and denies any further questions at this time.  Multiple Myeloma panel has some slight irregularities, and positive for IFE1 discussed some etiologies and that this test is not specific. Given her history with bone lesion right hip on CT- pending MRI. Will refer to hematology for further work up and evaluation. Provider placing referral today and she will call our office if not heard from MRI or hematology within 2 weeks,  CBC without any signs of infection or anemia.  CMP glucose, electrolytes, kidney and liver function within normal limits.  TSH for thyroid is within normal limits. Continue current dosage of levothyroxine.  RA factor within normal limits.  ANA negative.  Sed rate within normal limits.  Triglycerides are elevated, avoid wine, processed foods, and goods high in triglycerides. May add on a mercury free fish oil per package instructions. Total cholesterol and LDL elevated.  Discuss lifestyle modification with patient e.g. increase exercise, fiber, fruits, vegetables, lean meat, and omega 3/fish intake and decrease saturated fat.  If patient following strict diet and exercise program already please schedule follow up appointment with primary care physician.  Vitamin D within normal range - low end. Recommend Vitamin D 3 at 4,000 international units once daily by mouth.

## 2019-07-20 NOTE — Telephone Encounter (Signed)
Pt states Sharyn Lull is aware an abx prescribed to her by the hospital. Pt states it has given her a yeast infection and she hopes Sharyn Lull will call in  diflucan  For her to  [place], [place] Phone:  [phone]  Fax:  [phone]

## 2019-07-20 NOTE — Telephone Encounter (Signed)
Provider sent Diflucan 150 mg tablet take one today and may repeat 2nd tablet if needed for persistent symptoms after 72 hours. Sent to [place].  Follow up immediately if not improving.

## 2019-07-26 MED FILL — IBUPROFEN 800 MG TAB: 800 | 10 days supply | Qty: 30 | Fill #1

## 2019-07-26 MED FILL — clonazePAM 0.5 MG TABS: 0.5 | 30 days supply | Qty: 30 | Fill #1

## 2019-07-27 ENCOUNTER — Ambulatory Visit (INDEPENDENT_AMBULATORY_CARE_PROVIDER_SITE_OTHER): Payer: No Typology Code available for payment source

## 2019-07-27 ENCOUNTER — Ambulatory Visit (INDEPENDENT_AMBULATORY_CARE_PROVIDER_SITE_OTHER): Payer: No Typology Code available for payment source | Admitting: Obstetrics and Gynecology

## 2019-07-27 ENCOUNTER — Other Ambulatory Visit (HOSPITAL_COMMUNITY)
Admission: RE | Admit: 2019-07-27 | Discharge: 2019-07-27 | Disposition: A | Discharge status: Home / Self Care | Payer: No Typology Code available for payment source | Source: Ambulatory Visit | Attending: Obstetrics and Gynecology | Admitting: Obstetrics and Gynecology

## 2019-07-27 ENCOUNTER — Encounter: Payer: Self-pay | Admitting: Obstetrics and Gynecology

## 2019-07-27 ENCOUNTER — Other Ambulatory Visit: Payer: Self-pay

## 2019-07-27 VITALS — BP 110/74 | HR 85 | Ht 63.0 in | Wt 159.0 lb

## 2019-07-27 DIAGNOSIS — Z1151 Encounter for screening for human papillomavirus (HPV): Secondary | ICD-10-CM | POA: Diagnosis present

## 2019-07-27 DIAGNOSIS — N838 Other noninflammatory disorders of ovary, fallopian tube and broad ligament: Secondary | ICD-10-CM | POA: Diagnosis not present

## 2019-07-27 DIAGNOSIS — Z1231 Encounter for screening mammogram for malignant neoplasm of breast: Secondary | ICD-10-CM

## 2019-07-27 DIAGNOSIS — Z124 Encounter for screening for malignant neoplasm of cervix: Secondary | ICD-10-CM

## 2019-07-27 DIAGNOSIS — Z01419 Encounter for gynecological examination (general) (routine) without abnormal findings: Secondary | ICD-10-CM

## 2019-07-27 DIAGNOSIS — Z87440 Personal history of urinary (tract) infections: Secondary | ICD-10-CM | POA: Diagnosis not present

## 2019-07-27 LAB — POCT URINALYSIS DIPSTICK
Bilirubin, UA: NEGATIVE
Blood, UA: NEGATIVE
Glucose, UA: NEGATIVE
Ketones, UA: NEGATIVE
Leukocytes, UA: NEGATIVE
Nitrite, UA: NEGATIVE
Protein, UA: NEGATIVE
Spec Grav, UA: 1.025 (ref 1.010–1.025)
pH, UA: 5 (ref 5.0–8.0)

## 2019-07-27 NOTE — Patient Instructions (Signed)
I value your feedback and entrusting us with your care. If you get a [facility] patient survey, I would appreciate you taking the time to let us know about your experience today. Thank you!  As of February 17, 2019, your lab results will be released to your MyChart immediately, before I even have a chance to see them. Please give me time to review them and contact you if there are any abnormalities. Thank you for your patience.   [facility]: [phone]  [facility]: [phone]  

## 2019-07-27 NOTE — Progress Notes (Signed)
PCP: Doreen Beam, FNP   Chief Complaint  Patient presents with  . Referral    [facility]   . Gynecologic Exam    Recent 8 wk UTI & found L Ov Cyst    HPI:      Ms. Kristi Carter is a 59 y.o. No obstetric history on file. whose LMP was No LMP recorded. Patient is postmenopausal., presents today for her NP annual examination.  Her menses are absent due to menopause (LMP> 10 yrs ago). No PMB. She does have vasomotor sx.   Sex activity: not sexually active. She does not have vaginal dryness.  Last Pap: not recent; no hx of abn  Developed hematuria, abd and flank pain, and worsening abd bloating/distention (has been a chronic problem for pt) with loss of appetite 3/21. Had recurrent UTI/pyelo, resolved after several rounds of abx.  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 No GI sx. Has upcoming hematology appt for possible multiple myeloma. UTI sx resolved. Wants urine rechecked today. Not taking probiotics.  Last mammogram: not recent There is no FH of breast cancer. There is no FH of ovarian cancer. The patient does not do self-breast exams.  Colonoscopy: 2019 with Dr. Alice Reichert.  Repeat due after 5 years.   Tobacco use: The patient denies current or previous tobacco use. Alcohol use: 2-3 glasses wine nightly No drug use. Exercise: not active  She does get adequate calcium and Vitamin D in her diet.  Labs with PCP. Being eval for multiple myeloma.   Past Medical History:  Diagnosis Date  . A-fib ([place])   . Allergy   . Anxiety   . GERD (gastroesophageal reflux disease)   . Hypothyroid     Past Surgical History:  Procedure Laterality Date  . BRAIN SURGERY    . CARDIAC ELECTROPHYSIOLOGY MAPPING AND ABLATION      Family History  Problem Relation Age of Onset  . Healthy Father   . Cancer Paternal Uncle   . Stroke Paternal Grandmother   . Heart attack Paternal Grandfather     Social History   Socioeconomic History  . Marital  status: Married    Spouse name: Not on file  . Number of children: Not on file  . Years of education: Not on file  . Highest education level: Not on file  Occupational History  . Not on file  Tobacco Use  . Smoking status: Never Smoker  . Smokeless tobacco: Never Used  Substance and Sexual Activity  . Alcohol use: Yes    Alcohol/week: 10.0 standard drinks    Types: 10 Glasses of wine per week  . Drug use: Never  . Sexual activity: Yes  Other Topics Concern  . Not on file  Social History Narrative  . Not on file   Social Determinants of Health   Financial Resource Strain:   . Difficulty of Paying Living Expenses:   Food Insecurity:   . Worried About Charity fundraiser in the Last Year:   . Arboriculturist in the Last Year:   Transportation Needs:   . Film/video editor (Medical):   Marland Kitchen Lack of Transportation (Non-Medical):   Physical Activity:   . Days of Exercise per Week:   . Minutes of Exercise per Session:   Stress:   . Feeling of Stress :   Social Connections:   . Frequency of Communication with Friends and Family:   . Frequency of Social  Gatherings with Friends and Family:   . Attends Religious Services:   . Active Member of Clubs or Organizations:   . Attends Archivist Meetings:   Marland Kitchen Marital Status:   Intimate Partner Violence:   . Fear of Current or Ex-Partner:   . Emotionally Abused:   Marland Kitchen Physically Abused:   . Sexually Abused:      Current Outpatient Medications:  .  atorvastatin (LIPITOR) 40 MG tablet, , Disp: , Rfl:  .  clonazePAM (KLONOPIN) 0.5 MG tablet, Take 0.5 mg by mouth daily as needed., Disp: , Rfl:  .  esomeprazole (NEXIUM) 40 MG capsule, , Disp: , Rfl:  .  fluconazole (DIFLUCAN) 150 MG tablet, Take 1 tablet (150 mg total) by mouth as directed. Take one tablet by mouth on day 1. May repeat dose of one tablet by mouth on day four., Disp: 2 tablet, Rfl: 0 .  ibuprofen (ADVIL) 800 MG tablet, Take 800 mg by mouth every 6 (six) hours  as needed., Disp: , Rfl:  .  levothyroxine (SYNTHROID) 50 MCG tablet, Take 50 mcg by mouth every morning., Disp: , Rfl:  .  ibuprofen (ADVIL) 600 MG tablet, Take 1 tablet (600 mg total) by mouth every 8 (eight) hours as needed for moderate pain. (Patient not taking: Reported on 07/27/2019), Disp: 20 tablet, Rfl: 0 .  phenazopyridine (PYRIDIUM) 200 MG tablet, Take 200 mg by mouth 3 (three) times daily as needed for pain., Disp: , Rfl:      ROS:  Review of Systems  Constitutional: Negative for fatigue, fever and unexpected weight change.  Respiratory: Negative for cough, shortness of breath and wheezing.   Cardiovascular: Negative for chest pain, palpitations and leg swelling.  Gastrointestinal: Positive for abdominal distention. Negative for blood in stool, constipation, diarrhea, nausea and vomiting.  Endocrine: Negative for cold intolerance, heat intolerance and polyuria.  Genitourinary: Positive for dysuria and frequency. Negative for dyspareunia, flank pain, genital sores, hematuria, menstrual problem, pelvic pain, urgency, vaginal bleeding, vaginal discharge and vaginal pain.  Musculoskeletal: Negative for back pain, joint swelling and myalgias.  Skin: Negative for rash.  Neurological: Negative for dizziness, syncope, light-headedness, numbness and headaches.  Hematological: Negative for adenopathy.  Psychiatric/Behavioral: Negative for agitation, confusion, sleep disturbance and suicidal ideas. The patient is not nervous/anxious.   BREAST: No symptoms   Objective: BP 110/74 (BP Location: Left Arm, Patient Position: Sitting, Cuff Size: Normal)   Pulse 85   Ht '5\' 3"'  (1.6 m)   Wt 159 lb (72.1 kg)   BMI 28.17 kg/m    Physical Exam Constitutional:      Appearance: She is well-developed.  Genitourinary:     Vulva, vagina, cervix, uterus, right adnexa and left adnexa normal.     No vulval lesion or tenderness noted.     No vaginal discharge, erythema or tenderness.     No  cervical polyp.     Uterus is not enlarged or tender.     No right or left adnexal mass present.     Right adnexa not tender.     Left adnexa not tender.  Neck:     Thyroid: No thyromegaly.  Cardiovascular:     Rate and Rhythm: Normal rate and regular rhythm.     Heart sounds: Normal heart sounds. No murmur.  Pulmonary:     Effort: Pulmonary effort is normal.     Breath sounds: Normal breath sounds.  Chest:     Breasts:  Right: No mass, nipple discharge, skin change or tenderness.        Left: No mass, nipple discharge, skin change or tenderness.  Abdominal:     General: There is distension.     Palpations: Abdomen is soft.     Tenderness: There is no abdominal tenderness. There is no guarding.  Musculoskeletal:        General: Normal range of motion.     Cervical back: Normal range of motion.  Neurological:     General: No focal deficit present.     Mental Status: She is alert and oriented to person, place, and time.     Cranial Nerves: No cranial nerve deficit.  Skin:    General: Skin is warm and dry.  Psychiatric:        Mood and Affect: Mood normal.        Behavior: Behavior normal.        Thought Content: Thought content normal.        Judgment: Judgment normal.  Vitals reviewed.     Results:  Results for orders placed or performed in visit on 07/27/19 (from the past 24 hour(s))  POCT Urinalysis Dipstick     Status: Normal   Collection Time: 07/27/19  4:30 PM  Result Value Ref Range   Color, UA amber    Clarity, UA clear    Glucose, UA Negative Negative   Bilirubin, UA neg    Ketones, UA neg    Spec Grav, UA 1.025 1.010 - 1.025   Blood, UA neg    pH, UA 5.0 5.0 - 8.0   Protein, UA Negative Negative   Urobilinogen, UA     Nitrite, UA neg    Leukocytes, UA Negative Negative   Appearance     Odor       ULTRASOUND REPORT  Location: [facility]  Date of Service: 07/27/2019    Indications:Pelvic Pain Findings:  The uterus is anteverted  and measures 6.3 x 3.7 x 2.9 cm.  The Endometrium measures 2.6 mm.  The Right Ovary is not visible.  Left Ovary measures 2.1 x 1.0 x 1.0 cm. It is normal in appearance. Survey of the adnexa demonstrates no adnexal masses. There is no free fluid in the cul de sac.  Impression: 1. Normal appearing uterus and cervix.  2. Normal appearing left ovary.  3. The right ovary is not visible.   Recommendations: 1.Clinical correlation with the patient's History and Physical Exam.  Gweneth Dimitri, RT  Assessment/Plan:  Encounter for annual routine gynecological examination  Cervical cancer screening - Plan: Cytology - PAP  Screening for HPV (human papillomavirus) - Plan: Cytology - PAP  Encounter for screening mammogram for malignant neoplasm of breast - Plan: MM 3D SCREEN BREAST BILATERAL; pt to sched mammo  Ovarian mass, left - Plan: CA 125, US PELVIC COMPLETE WITH TRANSVAGINAL, Check ca-125 (lab done before GYN u/s). LTO mass resolved on GYN u/s. Will f/u with lab results. If neg, reassurance. F/u prn.   History of UTI - Plan: POCT Urinalysis Dipstick; Sx resolved. Neg UA today. F/u with PCP prn.         GYN counsel breast self exam, mammography screening, menopause, adequate intake of calcium and vitamin D, diet and exercise    F/U  Return in about 1 year (around 07/26/2020).  Abdurrahman Petersheim B. Niah Heinle, PA-C 07/27/2019 4:31 PM

## 2019-07-28 LAB — CA 125: Cancer Antigen (CA) 125: 11 U/mL (ref 0.0–38.1)

## 2019-07-29 ENCOUNTER — Ambulatory Visit: Payer: PRIVATE HEALTH INSURANCE

## 2019-07-29 LAB — CYTOLOGY - PAP
Comment: NEGATIVE
Diagnosis: NEGATIVE
High risk HPV: POSITIVE — AB

## 2019-08-01 MED ORDER — FLUCONAZOLE 150 MG PO TABS
150.0000 mg | ORAL_TABLET | Freq: Once | ORAL | 0 refills | Status: AC
Start: 2019-08-01 — End: 2019-08-01

## 2019-08-01 MED FILL — FLUCONAZOLE 150 MG TABS: 150 | 1 days supply | Qty: 1 | Fill #0

## 2019-08-01 NOTE — Addendum Note (Signed)
Addended by: Ardeth Perfect B on: 0000000 02:34 PM   Modules accepted: Orders

## 2019-08-04 ENCOUNTER — Encounter: Payer: Self-pay | Admitting: Oncology

## 2019-08-04 ENCOUNTER — Inpatient Hospital Stay: Payer: No Typology Code available for payment source | Admitting: Oncology

## 2019-08-05 ENCOUNTER — Other Ambulatory Visit: Payer: Self-pay

## 2019-08-05 ENCOUNTER — Encounter: Payer: No Typology Code available for payment source | Admitting: Oncology

## 2019-08-05 ENCOUNTER — Encounter: Payer: Self-pay | Admitting: Adult Health

## 2019-08-05 ENCOUNTER — Ambulatory Visit
Admission: RE | Admit: 2019-08-05 | Discharge: 2019-08-05 | Disposition: A | Discharge status: Home / Self Care | Payer: No Typology Code available for payment source | Source: Ambulatory Visit | Attending: Adult Health | Admitting: Adult Health

## 2019-08-05 ENCOUNTER — Other Ambulatory Visit: Payer: Self-pay | Admitting: Adult Health

## 2019-08-05 DIAGNOSIS — M4807 Spinal stenosis, lumbosacral region: Secondary | ICD-10-CM | POA: Diagnosis not present

## 2019-08-05 MED ORDER — GADOBUTROL 1 MMOL/ML IV SOLN
7.0000 mL | Freq: Once | INTRAVENOUS | Status: AC | PRN
  Administered 2019-08-05: 7 mL via INTRAVENOUS

## 2019-08-09 ENCOUNTER — Encounter: Payer: Self-pay | Admitting: Oncology

## 2019-08-09 ENCOUNTER — Inpatient Hospital Stay: Payer: No Typology Code available for payment source | Attending: Oncology | Admitting: Oncology

## 2019-08-09 ENCOUNTER — Inpatient Hospital Stay: Payer: No Typology Code available for payment source

## 2019-08-09 ENCOUNTER — Other Ambulatory Visit: Payer: Self-pay

## 2019-08-09 VITALS — BP 119/86 | HR 75 | Temp 98.3°F | Resp 16 | Wt 160.0 lb

## 2019-08-09 DIAGNOSIS — R778 Other specified abnormalities of plasma proteins: Secondary | ICD-10-CM | POA: Insufficient documentation

## 2019-08-09 DIAGNOSIS — I4891 Unspecified atrial fibrillation: Secondary | ICD-10-CM | POA: Diagnosis not present

## 2019-08-09 DIAGNOSIS — F419 Anxiety disorder, unspecified: Secondary | ICD-10-CM | POA: Diagnosis not present

## 2019-08-09 DIAGNOSIS — E559 Vitamin D deficiency, unspecified: Secondary | ICD-10-CM | POA: Insufficient documentation

## 2019-08-09 DIAGNOSIS — E039 Hypothyroidism, unspecified: Secondary | ICD-10-CM | POA: Insufficient documentation

## 2019-08-09 DIAGNOSIS — M899 Disorder of bone, unspecified: Secondary | ICD-10-CM | POA: Insufficient documentation

## 2019-08-09 NOTE — Progress Notes (Signed)
Patient here for initial oncology appointment, ocassional dizziness otherwise expresses no complaints or concerns at this time.

## 2019-08-10 LAB — IFE AND PE, RANDOM URINE
% BETA, Urine: 26.2 %
ALPHA 1 URINE: 4.2 %
Albumin, U: 22.5 %
Alpha 2, Urine: 15.3 %
GAMMA GLOBULIN URINE: 31.7 %
Total Protein, Urine: 7.1 mg/dL

## 2019-08-10 LAB — KAPPA/LAMBDA LIGHT CHAINS
Kappa free light chain: 17.3 mg/L (ref 3.3–19.4)
Kappa, lambda light chain ratio: 0.9 (ref 0.26–1.65)
Lambda free light chains: 19.3 mg/L (ref 5.7–26.3)

## 2019-08-12 NOTE — Progress Notes (Signed)
Hematology/Oncology Consult note [facility] [phone] Fax:([phone]  Patient Care Team: Doreen Beam, FNP as PCP - General (Family Medicine)   Name of the patient: Kristi Carter  [id number]  31-Jan-1961    Reason for referral- abnormal IFE   Referring physician- Laverna Peace  Date of visit: 08/12/19   History of presenting illness- Patient is a 59 year old female with a past medical history significant for hypothyroidism, hyperlipidemia and anxiety.  She has been referred to [place] for abnormal immunofixation.  Patient was noted to have a 10 mm lucent lesion on her right ilium on the CT renal stone study.  This was reported to be nonspecific and could represent fibro-osseous lesion.  Differential also includes metastatic disease and myeloma.  She was also noted to have a left adnexal mass 2.1 x 1.2 x 2.2 cm which was subsequently thought to be a possible dermoid cyst for which she followed up with GYN.  She therefore underwent a myeloma work-up which showed no M protein on SPEP but was found to have IgG monoclonal lambda protein on immunofixation.  CMP was normal with normal creatinine and calcium and total protein.  CBC was within normal limits with an H&H of 13.3/40.  Patient currently feels well and denies any new aches and pains anywhere.  Appetite and weight have remained stable.  She denies any flank pain abdominal pain or nausea presently  ECOG PS- 0  Pain scale- 0   Review of systems- Review of Systems  Constitutional: Negative for chills, fever, malaise/fatigue and weight loss.  HENT: Negative for congestion, ear discharge and nosebleeds.   Eyes: Negative for blurred vision.  Respiratory: Negative for cough, hemoptysis, sputum production, shortness of breath and wheezing.   Cardiovascular: Negative for chest pain, palpitations, orthopnea and claudication.  Gastrointestinal: Negative for abdominal pain, blood in stool,  constipation, diarrhea, heartburn, melena, nausea and vomiting.  Genitourinary: Negative for dysuria, flank pain, frequency, hematuria and urgency.  Musculoskeletal: Negative for back pain, joint pain and myalgias.  Skin: Negative for rash.  Neurological: Negative for dizziness, tingling, focal weakness, seizures, weakness and headaches.  Endo/Heme/Allergies: Does not bruise/bleed easily.  Psychiatric/Behavioral: Negative for depression and suicidal ideas. The patient does not have insomnia.     Allergies  Allergen Reactions  . Other     Cats, Mold and Feathers  . Prednisone     Other reaction(s): Vomiting  . Tape     Patient Active Problem List   Diagnosis Date Noted  . History of UTI 07/27/2019  . Blood dyscrasia- IFE1 positive- multiple myeloma panel mild abnormalities  07/20/2019  . Antibiotic-induced yeast infection 07/20/2019  . Vitamin D deficiency 07/20/2019  . Screening for malignant neoplasm of colon 07/15/2019  . Bladder wall thickening 07/15/2019  . Bone lesion 07/15/2019  . Blood in stool 07/15/2019  . Recurrent UTI (urinary tract infection) 07/15/2019  . Abdominal bloating 07/15/2019  . Other fatigue 07/15/2019  . 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 H/O bilateral breast implants 07/15/2019  . GERD without esophagitis 06/14/2018  . Hypothyroid 06/14/2018  . Anxiety disorder 10/20/2014  . Elevated cholesterol with high triglycerides 10/20/2014  . Nonulcer dyspepsia 10/20/2014     Past Medical History:  Diagnosis Date  . A-fib ([place])   . Allergy   . Anxiety   . GERD (gastroesophageal reflux disease)   . Hypothyroid      Past Surgical History:  Procedure Laterality Date  . BRAIN  SURGERY    . CARDIAC ELECTROPHYSIOLOGY MAPPING AND ABLATION      Social History   Socioeconomic History  . Marital status: Married    Spouse name: Not on file  . Number of children: Not on file  . Years of education: Not on file  . Highest  education level: Not on file  Occupational History  . Not on file  Tobacco Use  . Smoking status: Never Smoker  . Smokeless tobacco: Never Used  Substance and Sexual Activity  . Alcohol use: Yes    Alcohol/week: 10.0 standard drinks    Types: 10 Glasses of wine per week  . Drug use: Never  . Sexual activity: Yes  Other Topics Concern  . Not on file  Social History Narrative  . Not on file   Social Determinants of Health   Financial Resource Strain:   . Difficulty of Paying Living Expenses:   Food Insecurity:   . Worried About Charity fundraiser in the Last Year:   . Arboriculturist in the Last Year:   Transportation Needs:   . Film/video editor (Medical):   Marland Kitchen Lack of Transportation (Non-Medical):   Physical Activity:   . Days of Exercise per Week:   . Minutes of Exercise per Session:   Stress:   . Feeling of Stress :   Social Connections:   . Frequency of Communication with Friends and Family:   . Frequency of Social Gatherings with Friends and Family:   . Attends Religious Services:   . Active Member of Clubs or Organizations:   . Attends Archivist Meetings:   Marland Kitchen Marital Status:   Intimate Partner Violence:   . Fear of Current or Ex-Partner:   . Emotionally Abused:   Marland Kitchen Physically Abused:   . Sexually Abused:      Family History  Problem Relation Age of Onset  . Healthy Father   . Cancer Paternal Uncle   . Stroke Paternal Grandmother   . Heart attack Paternal Grandfather      Current Outpatient Medications:  .  atorvastatin (LIPITOR) 40 MG tablet, , Disp: , Rfl:  .  cholecalciferol (VITAMIN D3) 25 MCG (1000 UNIT) tablet, Take 4,000 Units by mouth daily., Disp: , Rfl:  .  clonazePAM (KLONOPIN) 0.5 MG tablet, Take 0.5 mg by mouth daily as needed., Disp: , Rfl:  .  ibuprofen (ADVIL) 800 MG tablet, Take 800 mg by mouth every 6 (six) hours as needed., Disp: , Rfl:  .  levothyroxine (SYNTHROID) 50 MCG tablet, Take 50 mcg by mouth every morning.,  Disp: , Rfl:  .  Omega-3 Fatty Acids (FISH OIL) 1000 MG CAPS, Take 1,000 mg by mouth., Disp: , Rfl:  .  esomeprazole (NEXIUM) 40 MG capsule, , Disp: , Rfl:  .  fluconazole (DIFLUCAN) 150 MG tablet, Take 1 tablet (150 mg total) by mouth as directed. Take one tablet by mouth on day 1. May repeat dose of one tablet by mouth on day four. (Patient not taking: Reported on 08/09/2019), Disp: 2 tablet, Rfl: 0 .  ibuprofen (ADVIL) 600 MG tablet, Take 1 tablet (600 mg total) by mouth every 8 (eight) hours as needed for moderate pain. (Patient not taking: Reported on 07/27/2019), Disp: 20 tablet, Rfl: 0 .  phenazopyridine (PYRIDIUM) 200 MG tablet, Take 200 mg by mouth 3 (three) times daily as needed for pain., Disp: , Rfl:    Physical exam:  Vitals:   08/09/19 1353  BP: 119/86  Pulse: 75  Resp: 16  Temp: 98.3 F (36.8 C)  TempSrc: Oral  SpO2: 100%  Weight: 160 lb (72.6 kg)   Physical Exam Constitutional:      General: She is not in acute distress. Cardiovascular:     Rate and Rhythm: Normal rate and regular rhythm.     Heart sounds: Normal heart sounds.  Pulmonary:     Effort: Pulmonary effort is normal.     Breath sounds: Normal breath sounds.  Abdominal:     General: Bowel sounds are normal.     Palpations: Abdomen is soft.     Comments: No palpable hepatosplenomegaly  Lymphadenopathy:     Comments: No palpable cervical, supraclavicular, axillary or inguinal adenopathy   Skin:    General: Skin is warm and dry.  Neurological:     Mental Status: She is alert and oriented to person, place, and time.        CMP Latest Ref Rng & Units 07/15/2019  Glucose 65 - 99 mg/dL 79  BUN 6 - 24 mg/dL 12  Creatinine 0.57 - 1.00 mg/dL 0.57  Sodium 134 - 144 mmol/L 138  Potassium 3.5 - 5.2 mmol/L 4.4  Chloride 96 - 106 mmol/L 102  CO2 20 - 29 mmol/L 23  Calcium 8.7 - 10.2 mg/dL 9.4  Total Protein 6.0 - 8.5 g/dL 7.1  Total Bilirubin 0.0 - 1.2 mg/dL 0.4  Alkaline Phos 39 - 117 IU/L 99  AST 0 -  40 IU/L 34  ALT 0 - 32 IU/L 16   CBC Latest Ref Rng & Units 07/15/2019  WBC 3.4 - 10.8 x10E3/uL 6.9  Hemoglobin 11.1 - 15.9 g/dL 13.3  Hematocrit 34.0 - 46.6 % 40.0  Platelets 150 - 450 x10E3/uL 359    No images are attached to the encounter.  DG Lumbar Spine Complete  Result Date: 07/15/2019 CLINICAL DATA:  Pain.  Lucent bone lesion in the right iliac bone. EXAM: LUMBAR SPINE - COMPLETE 4+ VIEW COMPARISON:  None. FINDINGS: There is no evidence of lumbar spine fracture. Alignment is normal. Minimal disc degenerative changes are noted throughout the lumbar spine, greatest at the L2-L3 level. There may be some mild facet arthrosis at the lower lumbar segments. IMPRESSION: Negative. Electronically Signed   By: Constance Holster M.D.   On: 07/15/2019 23:40   MR LUMBAR SPINE W WO CONTRAST  Addendum Date: 08/11/2019   ADDENDUM REPORT: 08/11/2019 08:31 ADDENDUM: Correlation was made with prior CT of abdomen/pelvis dated 06/03/2019. A 10 mm lucent lesion was seen in the posterior aspect of the right ilium which is partially included on the inferior most aspect of MRI of the lumbar spine and demonstrates T1 hyperintensity within the lesion most consistent with a benign fibro-osseous or lipomatous lesion. No further evaluation is recommended. Electronically Signed   By: Kathreen Devoid   On: 08/11/2019 08:31   Result Date: 08/11/2019 CLINICAL DATA:  Low back pain for over 6 weeks.  Hip pain. EXAM: MRI LUMBAR SPINE WITHOUT AND WITH CONTRAST TECHNIQUE: Multiplanar and multiecho pulse sequences of the lumbar spine were obtained without and with intravenous contrast. CONTRAST:  75m GADAVIST GADOBUTROL 1 MMOL/ML IV SOLN COMPARISON:  None. FINDINGS: Segmentation:  Standard. Alignment:  Physiologic. Vertebrae: No fracture, evidence of discitis, or aggressive bone lesion. T1 and T2 hyperintense L1 vertebral body bone lesion consistent with a hemangioma. Conus medullaris and cauda equina: Conus extends to the L1 level.  Conus and cauda equina appear normal. Paraspinal and other soft tissues: No acute  paraspinal abnormality. Disc levels: Disc spaces: Degenerative disease with mild disc height loss at L1-2 and L2-3. T12-L1: No significant disc bulge. No evidence of neural foraminal stenosis. No central canal stenosis. L1-L2: Broad shallow central disc protrusion. No evidence of neural foraminal stenosis. No central canal stenosis. L2-L3: Mild broad-based disc bulge. No evidence of neural foraminal stenosis. No central canal stenosis. L3-L4: No significant disc bulge. No evidence of neural foraminal stenosis. No central canal stenosis. L4-L5: Minimal broad-based disc bulge. Mild bilateral facet arthropathy. No evidence of neural foraminal stenosis. No central canal stenosis. L5-S1: Mild broad-based disc bulge. No evidence of neural foraminal stenosis. No central canal stenosis. IMPRESSION: 1. Mild lumbar spine spondylosis as described above. 2. No acute osseous injury of the lumbar spine. Electronically Signed: By: Kathreen Devoid On: 08/06/2019 14:36   US PELVIC COMPLETE WITH TRANSVAGINAL  Result Date: 07/27/2019 Patient Name: NIKKIE LIMING DOB: 12-Oct-1960 MRN: [id number] ULTRASOUND REPORT Location: [facility] Date of Service: 07/27/2019 Indications:Pelvic Pain Findings: The uterus is anteverted and measures 6.3 x 3.7 x 2.9 cm. The Endometrium measures 2.6 mm. The Right Ovary is not visible. Left Ovary measures 2.1 x 1.0 x 1.0 cm. It is normal in appearance. Survey of the adnexa demonstrates no adnexal masses. There is no free fluid in the cul de sac. Impression: 1. Normal appearing uterus and cervix. 2. Normal appearing left ovary. 3. The right ovary is not visible. Recommendations: 1.Clinical correlation with the patient's History and Physical Exam. Gweneth Dimitri, RT Images reviewed.  Normal GYN study without visualized pathology.  Malachy Mood, MD, [facility], [facility]   DG Hip Unilat W  OR W/O Pelvis 2-3 Views Left  Result Date: 07/15/2019 CLINICAL DATA:  Bilateral hip pain. Lumbar bone lesion seen on renal CT. Pain of the greater trochanter. EXAM: DG HIP (WITH OR WITHOUT PELVIS) 2-3V LEFT; DG HIP (WITH OR WITHOUT PELVIS) 2-3V RIGHT COMPARISON:  CT dated June 03, 2019 FINDINGS: There is no evidence of hip fracture or dislocation. There is no evidence of arthropathy or other focal bone abnormality. The lesion on the patient's prior CT within the right iliac bone is better visualized on the patient's prior CT. IMPRESSION: 1. No acute osseous abnormality involving either hip. 2. The previously demonstrated lucent bone lesion in the posterior aspect of the right iliac bone is better visualized on the patient's prior CT. If further workup is desired, consider a contrast enhanced MRI for further evaluation. Electronically Signed   By: Constance Holster M.D.   On: 07/15/2019 23:39   DG Hip Unilat W OR W/O Pelvis 2-3 Views Right  Result Date: 07/15/2019 CLINICAL DATA:  Bilateral hip pain. Lumbar bone lesion seen on renal CT. Pain of the greater trochanter. EXAM: DG HIP (WITH OR WITHOUT PELVIS) 2-3V LEFT; DG HIP (WITH OR WITHOUT PELVIS) 2-3V RIGHT COMPARISON:  CT dated June 03, 2019 FINDINGS: There is no evidence of hip fracture or dislocation. There is no evidence of arthropathy or other focal bone abnormality. The lesion on the patient's prior CT within the right iliac bone is better visualized on the patient's prior CT. IMPRESSION: 1. No acute osseous abnormality involving either hip. 2. The previously demonstrated lucent bone lesion in the posterior aspect of the right iliac bone is better visualized on the patient's prior CT. If further workup is desired, consider a contrast enhanced MRI for further evaluation. Electronically Signed   By: Constance Holster M.D.   On: 07/15/2019 23:39  Assessment and plan- Patient is a 59 y.o. female referred for abnormal immunofixation and lucent lesion  noted on the right ilium  Based on patient's labs she does not have any CRAB criteria-no anemia or renal failure hypercalcemia.  She has a questionable 10 mm lucent lesion on the right ilium which I will review with radiology at tumor board.  Also her labs suggest no M protein on SPEP but she has a small detectable IgG lambda paraprotein on immunofixation.  I will check random urine protein electrophoresis and serum free light chains today.  If these labs are normal as well then I doubt that patient has any overt multiple myeloma based on an isolated lucent lesion noted on CT. 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  I will get in touch with radiology to see if further imaging of this lesion would be warranted.  She did undergo MRI lumbar spine which did not show any evidence of lytic lesions but did not visualize the ilium bone.   Video visit with patient in 10 days time  Thank you for this kind referral and the opportunity to participate in the care of this patient   Visit Diagnosis 1. Bone lesion   2. Abnormal SPEP     Dr. Randa Evens, MD, MPH [facility] [phone] 08/12/2019  8:23 AM

## 2019-08-12 NOTE — Progress Notes (Signed)
10 mm lesion of the right ilium is still visualized on this MRI.  Tumor board and oncology is reviewing per oncology note from this week - will wait on that outcome and reevaluate at that time. May need orthopedic referral  for chronic back pain.  Do recommend DEXA scan for osteoporosis as it has already been ordered.   Return to the office as advised and as needed.

## 2019-08-18 ENCOUNTER — Other Ambulatory Visit: Payer: No Typology Code available for payment source

## 2019-08-18 NOTE — Progress Notes (Signed)
Tumor Board Documentation  Kristi Carter was presented by Dr Keturah Barre at our Tumor Board on 08/18/2019, which included representatives from medical oncology, radiation oncology, internal medicine, navigation, pathology, radiology, surgical, pharmacy, genetics, research.  Kristi Carter currently presents as a new patient, for discussion with history of the following treatments: active survellience.  Additionally, we reviewed previous medical and familial history, history of present illness, and recent lab results along with all available histopathologic and imaging studies. The tumor board considered available treatment options and made the following recommendations:   No further follow up needed  The following procedures/referrals were also placed: No orders of the defined types were placed in this encounter.   Clinical Trial Status: not discussed   Staging used: Not Applicable  National site-specific guidelines   were discussed with respect to the case.  Tumor board is a meeting of clinicians from various specialty areas who evaluate and discuss patients for whom a multidisciplinary approach is being considered. Final determinations in the plan of care are those of the provider(s). The responsibility for follow up of recommendations given during tumor board is that of the provider.   Today's extended care, comprehensive team conference, Kristi Carter was not present for the discussion and was not examined.   Multidisciplinary Tumor Board is a multidisciplinary case peer review process.  Decisions discussed in the Multidisciplinary Tumor Board reflect the opinions of the specialists present at the conference without having examined the patient.  Ultimately, treatment and diagnostic decisions rest with the primary provider(s) and the patient.

## 2019-08-22 ENCOUNTER — Inpatient Hospital Stay (HOSPITAL_BASED_OUTPATIENT_CLINIC_OR_DEPARTMENT_OTHER): Payer: No Typology Code available for payment source | Admitting: Oncology

## 2019-08-22 ENCOUNTER — Encounter: Payer: Self-pay | Admitting: Oncology

## 2019-08-22 DIAGNOSIS — M899 Disorder of bone, unspecified: Secondary | ICD-10-CM | POA: Diagnosis not present

## 2019-08-22 DIAGNOSIS — D472 Monoclonal gammopathy: Secondary | ICD-10-CM | POA: Diagnosis not present

## 2019-08-22 NOTE — Progress Notes (Signed)
Patient called and pre- screened for virtual appoinment today with oncologist. No concerns or complaints at this time.

## 2019-08-23 MED FILL — IBUPROFEN 800 MG TAB: 800 | 10 days supply | Qty: 30 | Fill #2

## 2019-08-23 NOTE — Progress Notes (Deleted)
Hematology/Oncology Consult note [facility]  Telephone:([phone] Fax:([phone]  Patient Care Team: Doreen Beam, FNP as PCP - General (Family Medicine)   Name of the patient: Kristi Carter  [id number]  [birth year]   Date of visit: 08/23/19   Review of systems- ROS    Allergies  Allergen Reactions  . Other     Cats, Mold and Feathers  . Prednisone     Other reaction(s): Vomiting  . Tape      Past Medical History:  Diagnosis Date  . A-fib ([place])   . Allergy   . Anxiety   . GERD (gastroesophageal reflux disease)   . Hypothyroid      Past Surgical History:  Procedure Laterality Date  . BRAIN SURGERY    . CARDIAC ELECTROPHYSIOLOGY MAPPING AND ABLATION      Social History   Socioeconomic History  . Marital status: Married    Spouse name: Not on file  . Number of children: Not on file  . Years of education: Not on file  . Highest education level: Not on file  Occupational History  . Not on file  Tobacco Use  . Smoking status: Never Smoker  . Smokeless tobacco: Never Used  Vaping Use  . Vaping Use: Never used  Substance and Sexual Activity  . Alcohol use: Yes    Alcohol/week: 10.0 standard drinks    Types: 10 Glasses of wine per week  . Drug use: Never  . Sexual activity: Yes  Other Topics Concern  . Not on file  Social History Narrative  . Not on file   Social Determinants of Health   Financial Resource Strain:   . Difficulty of Paying Living Expenses:   Food Insecurity:   . Worried About Charity fundraiser in the Last Year:   . Arboriculturist in the Last Year:   Transportation Needs:   . Film/video editor (Medical):   Marland Kitchen Lack of Transportation (Non-Medical):   Physical Activity:   . Days of Exercise per Week:   . Minutes of Exercise per Session:   Stress:   . Feeling of Stress :   Social Connections:   . Frequency of Communication with Friends and Family:   . Frequency of Social Gatherings  with Friends and Family:   . Attends Religious Services:   . Active Member of Clubs or Organizations:   . Attends Archivist Meetings:   Marland Kitchen Marital Status:   Intimate Partner Violence:   . Fear of Current or Ex-Partner:   . Emotionally Abused:   Marland Kitchen Physically Abused:   . Sexually Abused:     Family History  Problem Relation Age of Onset  . Healthy Father   . Cancer Paternal Uncle   . Stroke Paternal Grandmother   . Heart attack Paternal Grandfather      Current Outpatient Medications:  .  atorvastatin (LIPITOR) 40 MG tablet, , Disp: , Rfl:  .  cholecalciferol (VITAMIN D3) 25 MCG (1000 UNIT) tablet, Take 4,000 Units by mouth daily., Disp: , Rfl:  .  clonazePAM (KLONOPIN) 0.5 MG tablet, Take 0.5 mg by mouth daily as needed., Disp: , Rfl:  .  fluconazole (DIFLUCAN) 150 MG tablet, Take 1 tablet (150 mg total) by mouth as directed. Take one tablet by mouth on day 1. May repeat dose of one tablet by mouth on day four., Disp: 2 tablet, Rfl: 0 .  ibuprofen (ADVIL) 800 MG tablet, Take 800  mg by mouth every 6 (six) hours as needed., Disp: , Rfl:  .  levothyroxine (SYNTHROID) 50 MCG tablet, Take 50 mcg by mouth every morning., Disp: , Rfl:  .  Omega-3 Fatty Acids (FISH OIL) 1000 MG CAPS, Take 1,000 mg by mouth., Disp: , Rfl:  .  esomeprazole (NEXIUM) 40 MG capsule, , Disp: , Rfl:  .  ibuprofen (ADVIL) 600 MG tablet, Take 1 tablet (600 mg total) by mouth every 8 (eight) hours as needed for moderate pain. (Patient not taking: Reported on 08/22/2019), Disp: 20 tablet, Rfl: 0 .  phenazopyridine (PYRIDIUM) 200 MG tablet, Take 200 mg by mouth 3 (three) times daily as needed for pain. (Patient not taking: Reported on 08/22/2019), Disp: , Rfl:   Physical exam: There were no vitals filed for this visit. Physical Exam   CMP Latest Ref Rng & Units 07/15/2019  Glucose 65 - 99 mg/dL 79  BUN 6 - 24 mg/dL 12  Creatinine 0.57 - 1.00 mg/dL 0.57  Sodium 134 - 144 mmol/L 138  Potassium 3.5 - 5.2  mmol/L 4.4  Chloride 96 - 106 mmol/L 102  CO2 20 - 29 mmol/L 23  Calcium 8.7 - 10.2 mg/dL 9.4  Total Protein 6.0 - 8.5 g/dL 7.1  Total Bilirubin 0.0 - 1.2 mg/dL 0.4  Alkaline Phos 39 - 117 IU/L 99  AST 0 - 40 IU/L 34  ALT 0 - 32 IU/L 16   CBC Latest Ref Rng & Units 07/15/2019  WBC 3.4 - 10.8 x10E3/uL 6.9  Hemoglobin 11.1 - 15.9 g/dL 13.3  Hematocrit 34.0 - 46.6 % 40.0  Platelets 150 - 450 x10E3/uL 359    No images are attached to the encounter.  MR LUMBAR SPINE W WO CONTRAST  Addendum Date: 08/11/2019   ADDENDUM REPORT: 08/11/2019 08:31 ADDENDUM: Correlation was made with prior CT of abdomen/pelvis dated 06/03/2019. A 10 mm lucent lesion was seen in the posterior aspect of the right ilium which is partially included on the inferior most aspect of MRI of the lumbar spine and demonstrates T1 hyperintensity within the lesion most consistent with a benign fibro-osseous or lipomatous lesion. No further evaluation is recommended. Electronically Signed   By: Kathreen Devoid   On: 08/11/2019 08:31   Result Date: 08/11/2019 CLINICAL DATA:  Low back pain for over 6 weeks.  Hip pain. EXAM: MRI LUMBAR SPINE WITHOUT AND WITH CONTRAST TECHNIQUE: Multiplanar and multiecho pulse sequences of the lumbar spine were obtained without and with intravenous contrast. CONTRAST:  11mL GADAVIST GADOBUTROL 1 MMOL/ML IV SOLN COMPARISON:  None. FINDINGS: Segmentation:  Standard. Alignment:  Physiologic. Vertebrae: No fracture, evidence of discitis, or aggressive bone lesion. T1 and T2 hyperintense L1 vertebral body bone lesion consistent with a hemangioma. Conus medullaris and cauda equina: Conus extends to the L1 level. Conus and cauda equina appear normal. Paraspinal and other soft tissues: No acute paraspinal abnormality. Disc levels: Disc spaces: Degenerative disease with mild disc height loss at L1-2 and L2-3. T12-L1: No significant disc bulge. No evidence of neural foraminal stenosis. No central canal stenosis. L1-L2: Broad  shallow central disc protrusion. No evidence of neural foraminal stenosis. No central canal stenosis. L2-L3: Mild broad-based disc bulge. No evidence of neural foraminal stenosis. No central canal stenosis. L3-L4: No significant disc bulge. No evidence of neural foraminal stenosis. No central canal stenosis. L4-L5: Minimal broad-based disc bulge. Mild bilateral facet arthropathy. No evidence of neural foraminal stenosis. No central canal stenosis. L5-S1: Mild broad-based disc bulge. No evidence of neural foraminal  stenosis. No central canal stenosis. IMPRESSION: 1. Mild lumbar spine spondylosis as described above. 2. No acute osseous injury of the lumbar spine. Electronically Signed: By: Kathreen Devoid On: 08/06/2019 14:36   US PELVIC COMPLETE WITH TRANSVAGINAL  Result Date: 07/27/2019 Patient Name: SHYE DOTY DOB: 01/24/1961 MRN: [id number] ULTRASOUND REPORT Location: [facility] Date of Service: 07/27/2019 Indications:Pelvic Pain Findings: The uterus is anteverted and measures 6.3 x 3.7 x 2.9 cm. The Endometrium measures 2.6 mm. The Right Ovary is not visible. Left Ovary measures 2.1 x 1.0 x 1.0 cm. It is normal in appearance. Survey of the adnexa demonstrates no adnexal masses. There is no free fluid in the cul de sac. Impression: 1. Normal appearing uterus and cervix. 2. Normal appearing left ovary. 3. The right ovary is not visible. Recommendations: 1.Clinical correlation with the patient's History and Physical Exam. Gweneth Dimitri, RT Images reviewed.  Normal GYN study without visualized pathology.  Malachy Mood, MD, [facility], [facility]     Assessment and plan- Patient is a 59 y.o. female ***   Visit Diagnosis 1. MGUS (monoclonal gammopathy of unknown significance)   2. Bone lesion      Dr. Randa Evens, MD, MPH [facility] at [facility] [phone] 08/23/2019 3:33 PM

## 2019-08-23 NOTE — Progress Notes (Signed)
I connected with Kristi Carter on 08/23/19 at  1:15 PM EDT by video enabled telemedicine visit and verified that I am speaking with the correct person using two identifiers.   I discussed the limitations, risks, security and privacy concerns of performing an evaluation and management service by telemedicine and the availability of in-person appointments. I also discussed with the patient that there may be a patient responsible charge related to this service. The patient expressed understanding and agreed to proceed.  Other persons participating in the visit and their role in the encounter:  none  Patient's location:  home Provider's location:  work   Pharmacologist Reason for visit-discuss results of blood work  Heme/Onc history: Patient is a 60 year old female with a past medical history significant for hypothyroidism, hyperlipidemia and anxiety.  She has been referred to [place] for abnormal immunofixation.  Patient was noted to have a 10 mm lucent lesion on her right ilium on the CT renal stone study.  This was reported to be nonspecific and could represent fibro-osseous lesion.  Differential also includes metastatic disease and myeloma.  She was also noted to have a left adnexal mass 2.1 x 1.2 x 2.2 cm which was subsequently thought to be a possible dermoid cyst for which she followed up with GYN.  She therefore underwent a myeloma work-up which showed no M protein on SPEP but was found to have IgG monoclonal lambda protein on immunofixation.  CMP was normal with normal creatinine and calcium and total protein.  CBC was within normal limits with an H&H of 13.3/40.   Interval history-no acute issues since last visit.  She is doing well  ECOG PS- 0 Pain scale- 0    ROS  Allergies  Allergen Reactions  . Other     Cats, Mold and Feathers  . Prednisone     Other reaction(s): Vomiting  . Tape     Past Medical History:  Diagnosis Date  . A-fib ([place])   . Allergy   .  Anxiety   . GERD (gastroesophageal reflux disease)   . Hypothyroid     Past Surgical History:  Procedure Laterality Date  . BRAIN SURGERY    . CARDIAC ELECTROPHYSIOLOGY MAPPING AND ABLATION      Social History   Socioeconomic History  . Marital status: Married    Spouse name: Not on file  . Number of children: Not on file  . Years of education: Not on file  . Highest education level: Not on file  Occupational History  . Not on file  Tobacco Use  . Smoking status: Never Smoker  . Smokeless tobacco: Never Used  Vaping Use  . Vaping Use: Never used  Substance and Sexual Activity  . Alcohol use: Yes    Alcohol/week: 10.0 standard drinks    Types: 10 Glasses of wine per week  . Drug use: Never  . Sexual activity: Yes  Other Topics Concern  . Not on file  Social History Narrative  . Not on file   Social Determinants of Health   Financial Resource Strain:   . Difficulty of Paying Living Expenses:   Food Insecurity:   . Worried About Charity fundraiser in the Last Year:   . Arboriculturist in the Last Year:   Transportation Needs:   . Film/video editor (Medical):   Marland Kitchen Lack of Transportation (Non-Medical):   Physical Activity:   . Days of Exercise per Week:   . Minutes of  Exercise per Session:   Stress:   . Feeling of Stress :   Social Connections:   . Frequency of Communication with Friends and Family:   . Frequency of Social Gatherings with Friends and Family:   . Attends Religious Services:   . Active Member of Clubs or Organizations:   . Attends Archivist Meetings:   Marland Kitchen Marital Status:   Intimate Partner Violence:   . Fear of Current or Ex-Partner:   . Emotionally Abused:   Marland Kitchen Physically Abused:   . Sexually Abused:     Family History  Problem Relation Age of Onset  . Healthy Father   . Cancer Paternal Uncle   . Stroke Paternal Grandmother   . Heart attack Paternal Grandfather      Current Outpatient Medications:  .  atorvastatin  (LIPITOR) 40 MG tablet, , Disp: , Rfl:  .  cholecalciferol (VITAMIN D3) 25 MCG (1000 UNIT) tablet, Take 4,000 Units by mouth daily., Disp: , Rfl:  .  clonazePAM (KLONOPIN) 0.5 MG tablet, Take 0.5 mg by mouth daily as needed., Disp: , Rfl:  .  fluconazole (DIFLUCAN) 150 MG tablet, Take 1 tablet (150 mg total) by mouth as directed. Take one tablet by mouth on day 1. May repeat dose of one tablet by mouth on day four., Disp: 2 tablet, Rfl: 0 .  ibuprofen (ADVIL) 800 MG tablet, Take 800 mg by mouth every 6 (six) hours as needed., Disp: , Rfl:  .  levothyroxine (SYNTHROID) 50 MCG tablet, Take 50 mcg by mouth every morning., Disp: , Rfl:  .  Omega-3 Fatty Acids (FISH OIL) 1000 MG CAPS, Take 1,000 mg by mouth., Disp: , Rfl:  .  esomeprazole (NEXIUM) 40 MG capsule, , Disp: , Rfl:  .  ibuprofen (ADVIL) 600 MG tablet, Take 1 tablet (600 mg total) by mouth every 8 (eight) hours as needed for moderate pain. (Patient not taking: Reported on 08/22/2019), Disp: 20 tablet, Rfl: 0 .  phenazopyridine (PYRIDIUM) 200 MG tablet, Take 200 mg by mouth 3 (three) times daily as needed for pain. (Patient not taking: Reported on 08/22/2019), Disp: , Rfl:   MR LUMBAR SPINE W WO CONTRAST  Addendum Date: 08/11/2019   ADDENDUM REPORT: 08/11/2019 08:31 ADDENDUM: Correlation was made with prior CT of abdomen/pelvis dated 06/03/2019. A 10 mm lucent lesion was seen in the posterior aspect of the right ilium which is partially included on the inferior most aspect of MRI of the lumbar spine and demonstrates T1 hyperintensity within the lesion most consistent with a benign fibro-osseous or lipomatous lesion. No further evaluation is recommended. Electronically Signed   By: Kathreen Devoid   On: 08/11/2019 08:31   Result Date: 08/11/2019 CLINICAL DATA:  Low back pain for over 6 weeks.  Hip pain. EXAM: MRI LUMBAR SPINE WITHOUT AND WITH CONTRAST TECHNIQUE: Multiplanar and multiecho pulse sequences of the lumbar spine were obtained without and with  intravenous contrast. CONTRAST:  69m GADAVIST GADOBUTROL 1 MMOL/ML IV SOLN COMPARISON:  None. FINDINGS: Segmentation:  Standard. Alignment:  Physiologic. Vertebrae: No fracture, evidence of discitis, or aggressive bone lesion. T1 and T2 hyperintense L1 vertebral body bone lesion consistent with a hemangioma. Conus medullaris and cauda equina: Conus extends to the L1 level. Conus and cauda equina appear normal. Paraspinal and other soft tissues: No acute paraspinal abnormality. Disc levels: Disc spaces: Degenerative disease with mild disc height loss at L1-2 and L2-3. T12-L1: No significant disc bulge. No evidence of neural foraminal stenosis. No central canal  stenosis. L1-L2: Broad shallow central disc protrusion. No evidence of neural foraminal stenosis. No central canal stenosis. L2-L3: Mild broad-based disc bulge. No evidence of neural foraminal stenosis. No central canal stenosis. L3-L4: No significant disc bulge. No evidence of neural foraminal stenosis. No central canal stenosis. L4-L5: Minimal broad-based disc bulge. Mild bilateral facet arthropathy. No evidence of neural foraminal stenosis. No central canal stenosis. L5-S1: Mild broad-based disc bulge. No evidence of neural foraminal stenosis. No central canal stenosis. IMPRESSION: 1. Mild lumbar spine spondylosis as described above. 2. No acute osseous injury of the lumbar spine. Electronically Signed: By: Kathreen Devoid On: 08/06/2019 14:36   US PELVIC COMPLETE WITH TRANSVAGINAL  Result Date: 07/27/2019 Patient Name: Kristi Carter DOB: 01-25-1961 MRN: [id number] ULTRASOUND REPORT Location: [facility] Date of Service: 07/27/2019 Indications:Pelvic Pain Findings: The uterus is anteverted and measures 6.3 x 3.7 x 2.9 cm. The Endometrium measures 2.6 mm. The Right Ovary is not visible. Left Ovary measures 2.1 x 1.0 x 1.0 cm. It is normal in appearance. Survey of the adnexa demonstrates no adnexal masses. There is no free fluid in the cul de sac.  Impression: 1. Normal appearing uterus and cervix. 2. Normal appearing left ovary. 3. The right ovary is not visible. Recommendations: 1.Clinical correlation with the patient's History and Physical Exam. Gweneth Dimitri, RT Images reviewed.  Normal GYN study without visualized pathology.  Malachy Mood, MD, [facility], [facility]    No images are attached to the encounter.   CMP Latest Ref Rng & Units 07/15/2019  Glucose 65 - 99 mg/dL 79  BUN 6 - 24 mg/dL 12  Creatinine 0.57 - 1.00 mg/dL 0.57  Sodium 134 - 144 mmol/L 138  Potassium 3.5 - 5.2 mmol/L 4.4  Chloride 96 - 106 mmol/L 102  CO2 20 - 29 mmol/L 23  Calcium 8.7 - 10.2 mg/dL 9.4  Total Protein 6.0 - 8.5 g/dL 7.1  Total Bilirubin 0.0 - 1.2 mg/dL 0.4  Alkaline Phos 39 - 117 IU/L 99  AST 0 - 40 IU/L 34  ALT 0 - 32 IU/L 16   CBC Latest Ref Rng & Units 07/15/2019  WBC 3.4 - 10.8 x10E3/uL 6.9  Hemoglobin 11.1 - 15.9 g/dL 13.3  Hematocrit 34.0 - 46.6 % 40.0  Platelets 150 - 450 x10E3/uL 359     Observation/objective: Appears in no acute distress over video visit today.  Breathing is nonlabored  Assessment and plan: Patient is a 59 year old female and this is a follow-up visit for following issues  1.  MGUS: Patient found to have IgG lambda monoclonal protein on immunofixation but not on SPEP.  CBC and CMP are normal.  Random urine protein electrophoresis was also unremarkable.  Based on the results of these labs patient has MGUS which can be monitored every year.  This is not consistent with multiple myeloma.  Discussed natural history of MGUS and low risk of progression to multiple myeloma given that she had an IgG monoclonal protein less than 1.5 g.  She also does not have any current crab criteria that would be diagnostic for myeloma.  Her risk of progression from MGUS to myeloma is low  2.  Lucent lesion noted on CT abdomen: I have again reviewed these images at tumor board.  Lesion noted over the  posterior aspect of the right ilium appears to be containing fat and not diagnostic for myeloma.  This was deemed not requiring any further surveillance or imaging  Follow-up instructions: I  will see the patient back in 1 year with labs prior CBC with differential CMP myeloma panel and serum free light chains  I discussed the assessment and treatment plan with the patient. The patient was provided an opportunity to ask questions and all were answered. The patient agreed with the plan and demonstrated an understanding of the instructions.   The patient was advised to call back or seek an in-person evaluation if the symptoms worsen or if the condition fails to improve as anticipated.     Visit Diagnosis: 1. MGUS (monoclonal gammopathy of unknown significance)   2. Bone lesion     Dr. Randa Evens, MD, MPH [facility] at [facility] Tel- [phone] 08/23/2019 3:36 PM

## 2019-08-24 MED FILL — clonazePAM 0.5 MG TABS: 0.5 | 30 days supply | Qty: 30 | Fill #0

## 2019-08-26 ENCOUNTER — Ambulatory Visit (INDEPENDENT_AMBULATORY_CARE_PROVIDER_SITE_OTHER): Payer: No Typology Code available for payment source | Admitting: Adult Health

## 2019-08-26 ENCOUNTER — Other Ambulatory Visit: Payer: Self-pay

## 2019-08-26 ENCOUNTER — Encounter: Payer: Self-pay | Admitting: Adult Health

## 2019-08-26 VITALS — BP 138/74 | HR 106 | Temp 97.1°F | Wt 160.0 lb

## 2019-08-26 DIAGNOSIS — M549 Dorsalgia, unspecified: Secondary | ICD-10-CM | POA: Insufficient documentation

## 2019-08-26 DIAGNOSIS — N62 Hypertrophy of breast: Secondary | ICD-10-CM

## 2019-08-26 DIAGNOSIS — M546 Pain in thoracic spine: Secondary | ICD-10-CM | POA: Diagnosis not present

## 2019-08-26 DIAGNOSIS — Z9882 Breast implant status: Secondary | ICD-10-CM

## 2019-08-26 NOTE — Patient Instructions (Signed)
Acute Back Pain, Adult Acute back pain is sudden and usually short-lived. It is often caused by an injury to the muscles and tissues in the back. The injury may result from:  A muscle or ligament getting overstretched or torn (strained). Ligaments are tissues that connect bones to each other. Lifting something improperly can cause a back strain.  Wear and tear (degeneration) of the spinal disks. Spinal disks are circular tissue that provides cushioning between the bones of the spine (vertebrae).  Twisting motions, such as while playing sports or doing yard work.  A hit to the back.  Arthritis. You may have a physical exam, lab tests, and imaging tests to find the cause of your pain. Acute back pain usually goes away with rest and home care. Follow these instructions at home: Managing pain, stiffness, and swelling  Take over-the-counter and prescription medicines only as told by your health care provider.  Your health care provider may recommend applying ice during the first 24-48 hours after your pain starts. To do this: ? Put ice in a plastic bag. ? Place a towel between your skin and the bag. ? Leave the ice on for 20 minutes, 2-3 times a day.  If directed, apply heat to the affected area as often as told by your health care provider. Use the heat source that your health care provider recommends, such as a moist heat pack or a heating pad. ? Place a towel between your skin and the heat source. ? Leave the heat on for 20-30 minutes. ? Remove the heat if your skin turns bright red. This is especially important if you are unable to feel pain, heat, or cold. You have a greater risk of getting burned. Activity   Do not stay in bed. Staying in bed for more than 1-2 days can delay your recovery.  Sit up and stand up straight. Avoid leaning forward when you sit, or hunching over when you stand. ? If you work at a desk, sit close to it so you do not need to lean over. Keep your chin tucked  in. Keep your neck drawn back, and keep your elbows bent at a right angle. Your arms should look like the letter "L." ? Sit high and close to the steering wheel when you drive. Add lower back (lumbar) support to your car seat, if needed.  Take short walks on even surfaces as soon as you are able. Try to increase the length of time you walk each day.  Do not sit, drive, or stand in one place for more than 30 minutes at a time. Sitting or standing for long periods of time can put stress on your back.  Do not drive or use heavy machinery while taking prescription pain medicine.  Use proper lifting techniques. When you bend and lift, use positions that put less stress on your back: ? Bend your knees. ? Keep the load close to your body. ? Avoid twisting.  Exercise regularly as told by your health care provider. Exercising helps your back heal faster and helps prevent back injuries by keeping muscles strong and flexible.  Work with a physical therapist to make a safe exercise program, as recommended by your health care provider. Do any exercises as told by your physical therapist. Lifestyle  Maintain a healthy weight. Extra weight puts stress on your back and makes it difficult to have good posture.  Avoid activities or situations that make you feel anxious or stressed. Stress and anxiety increase muscle   tension and can make back pain worse. Learn ways to manage anxiety and stress, such as through exercise. General instructions  Sleep on a firm mattress in a comfortable position. Try lying on your side with your knees slightly bent. If you lie on your back, put a pillow under your knees.  Follow your treatment plan as told by your health care provider. This may include: ? Cognitive or behavioral therapy. ? Acupuncture or massage therapy. ? Meditation or yoga. Contact a health care provider if:  You have pain that is not relieved with rest or medicine.  You have increasing pain going down  into your legs or buttocks.  Your pain does not improve after 2 weeks.  You have pain at night.  You lose weight without trying.  You have a fever or chills. Get help right away if:  You develop new bowel or bladder control problems.  You have unusual weakness or numbness in your arms or legs.  You develop nausea or vomiting.  You develop abdominal pain.  You feel faint. Summary  Acute back pain is sudden and usually short-lived.  Use proper lifting techniques. When you bend and lift, use positions that put less stress on your back.  Take over-the-counter and prescription medicines and apply heat or ice as directed by your health care provider. This information is not intended to replace advice given to you by your health care provider. Make sure you discuss any questions you have with your health care provider. Document Revised: 06/15/2018 Document Reviewed: 10/08/2016 Elsevier Patient Education  [place]. Clonazepam tablets What is this medicine? CLONAZEPAM (kloe NA ze pam) is a benzodiazepine. It is used to treat certain types of seizures. It is also used to treat panic disorder. This medicine may be used for other purposes; ask your health care provider or pharmacist if you have questions. COMMON BRAND NAME(S): Ceberclon, Klonopin What should I tell my health care provider before I take this medicine? They need to know if you have any of these conditions:  an alcohol or drug abuse problem  bipolar disorder, depression, psychosis or other mental health condition  glaucoma  kidney or liver disease  lung or breathing disease  myasthenia gravis  Parkinson's disease  porphyria  seizures or a history of seizures  suicidal thoughts  an unusual or allergic reaction to clonazepam, other benzodiazepines, foods, dyes, or preservatives  pregnant or trying to get pregnant  breast-feeding How should I use this medicine? Take this medicine by mouth with  a glass of water. Follow the directions on the prescription label. If it upsets your stomach, take it with food or milk. Take your medicine at regular intervals. Do not take it more often than directed. Do not stop taking or change the dose except on the advice of your doctor or health care professional. A special MedGuide will be given to you by the pharmacist with each prescription and refill. Be sure to read this information carefully each time. Talk to your pediatrician regarding the use of this medicine in children. Special care may be needed. Overdosage: If you think you have taken too much of this medicine contact a poison control center or emergency room at once. NOTE: This medicine is only for you. Do not share this medicine with others. What if I miss a dose? If you miss a dose, take it as soon as you can. If it is almost time for your next dose, take only that dose. Do not take double  or extra doses. What may interact with this medicine? Do not take this medication with any of the following medicines:  narcotic medicines for cough  sodium oxybate This medicine may also interact with the following medications:  alcohol  antihistamines for allergy, cough and cold  antiviral medicines for HIV or AIDS  certain medicines for anxiety or sleep  certain medicines for depression, like amitriptyline, fluoxetine, sertraline  certain medicines for fungal infections like ketoconazole and itraconazole  certain medicines for seizures like carbamazepine, phenobarbital, phenytoin, primidone  general anesthetics like halothane, isoflurane, methoxyflurane, propofol  local anesthetics like lidocaine, pramoxine, tetracaine  medicines that relax muscles for surgery  narcotic medicines for pain  phenothiazines like chlorpromazine, mesoridazine, prochlorperazine, thioridazine This list may not describe all possible interactions. Give your health care provider a list of all the medicines,  herbs, non-prescription drugs, or dietary supplements you use. Also tell them if you smoke, drink alcohol, or use illegal drugs. Some items may interact with your medicine. What should I watch for while using this medicine? Tell your doctor or health care professional if your symptoms do not start to get better or if they get worse. Do not stop taking except on your doctor's advice. You may develop a severe reaction. Your doctor will tell you how much medicine to take. You may get drowsy or dizzy. Do not drive, use machinery, or do anything that needs mental alertness until you know how this medicine affects you. To reduce the risk of dizzy and fainting spells, do not stand or sit up quickly, especially if you are an older patient. Alcohol may increase dizziness and drowsiness. Avoid alcoholic drinks. If you are taking another medicine that also causes drowsiness, you may have more side effects. Give your health care provider a list of all medicines you use. Your doctor will tell you how much medicine to take. Do not take more medicine than directed. Call emergency for help if you have problems breathing or unusual sleepiness. The use of this medicine may increase the chance of suicidal thoughts or actions. Pay special attention to how you are responding while on this medicine. Any worsening of mood, or thoughts of suicide or dying should be reported to your health care professional right away. What side effects may I notice from receiving this medicine? Side effects that you should report to your doctor or health care professional as soon as possible:  allergic reactions like skin rash, itching or hives, swelling of the face, lips, or tongue  breathing problems  confusion  loss of balance or coordination  signs and symptoms of low blood pressure like dizziness; feeling faint or lightheaded, falls; unusually weak or tired  suicidal thoughts or mood changes Side effects that usually do not  require medical attention (report to your doctor or health care professional if they continue or are bothersome):  dizziness  headache  tiredness  upset stomach This list may not describe all possible side effects. Call your doctor for medical advice about side effects. You may report side effects to FDA at 1-800-FDA-1088. Where should I keep my medicine? Keep out of the reach of children. This medicine can be abused. Keep your medicine in a safe place to protect it from theft. Do not share this medicine with anyone. Selling or giving away this medicine is dangerous and against the law. This medicine may cause accidental overdose and death if taken by other adults, children, or pets. Mix any unused medicine with a substance like cat litter  or coffee grounds. Then throw the medicine away in a sealed container like a sealed bag or a coffee can with a lid. Do not use the medicine after the expiration date. Store at room temperature between 15 and 30 degrees C (59 and 86 degrees F). Protect from light. Keep container tightly closed. NOTE: This sheet is a summary. It may not cover all possible information. If you have questions about this medicine, talk to your doctor, pharmacist, or health care provider.  2020 Elsevier/Gold Standard (2015-08-03 18:46:32)

## 2019-08-26 NOTE — Progress Notes (Signed)
Established patient visit   Patient: Kristi Carter   DOB: 1960-04-29   59 y.o. Female  MRN: [id number] Visit Date: 08/26/2019  Today's healthcare provider: Marcille Buffy, FNP   No chief complaint on file.  Subjective    HPI  Patient is a 59 year old female who presents today for a 1 month follow up.  She states she has already been to the referral appointments that were recommended.  She has started the Fish oil and Vitamin D.   Her scan was negative.  She has follow up with hematology in 1 year. She does complain of upper and lower back pain.  She thinks it is coming from her breast size.  She reports some recent weight gain. Breast have gotten larger, hurts to wear a bra, she just wears a tank top. Breast implants 20 years ago. She says it restricts her activity with cooking, cleaning and yard work.  She denies any back trauma or thoracic trauma, however she reports this has been chronic for the last couple years and is worsening with time.  She reports her breasts are sagging now.  They cause her daily pain.  09/14/19 [place] [facility] is endocrinology.  MR- 08/11/19 She takes 800 Ibuprofen PRN and tylenol  T12-L1: No significant disc bulge. No evidence of neural foraminal stenosis. No central canal stenosis.  L1-L2: Broad shallow central disc protrusion. No evidence of neural foraminal stenosis. No central canal stenosis.  L2-L3: Mild broad-based disc bulge. No evidence of neural foraminal stenosis. No central canal stenosis.  She has mammogram July 9th.   Patient  denies any fever, body aches,chills, rash, chest pain, shortness of breath, nausea, vomiting, or diarrhea.   Patient Active Problem List   Diagnosis Date Noted  . Large breasts 08/26/2019  . Midline thoracic back pain 08/26/2019  . History of UTI 07/27/2019  . Blood dyscrasia- IFE1 positive- multiple myeloma panel mild abnormalities  07/20/2019  . Antibiotic-induced yeast infection  07/20/2019  . Vitamin D deficiency 07/20/2019  . Screening for malignant neoplasm of colon 07/15/2019  . Bladder wall thickening 07/15/2019  . Bone lesion 07/15/2019  . Blood in stool 07/15/2019  . Recurrent UTI (urinary tract infection) 07/15/2019  . Abdominal bloating 07/15/2019  . Other fatigue 07/15/2019  . 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 History of bilateral breast implants 07/15/2019  . GERD without esophagitis 06/14/2018  . Hypothyroid 06/14/2018  . Anxiety disorder 10/20/2014  . Elevated cholesterol with high triglycerides 10/20/2014  . Nonulcer dyspepsia 10/20/2014   Past Medical History:  Diagnosis Date  . A-fib ([place])   . Allergy   . Anxiety   . GERD (gastroesophageal reflux disease)   . Hypothyroid    Past Surgical History:  Procedure Laterality Date  . BRAIN SURGERY    . CARDIAC ELECTROPHYSIOLOGY MAPPING AND ABLATION     Social History   Tobacco Use  . Smoking status: Never Smoker  . Smokeless tobacco: Never Used  Vaping Use  . Vaping Use: Never used  Substance Use Topics  . Alcohol use: Yes    Alcohol/week: 10.0 standard drinks    Types: 10 Glasses of wine per week  . Drug use: Never   Social History   Socioeconomic History  . Marital status: Married    Spouse name: Not on file  . Number of children: Not on file  . Years of education: Not on file  . Highest education  level: Not on file  Occupational History  . Not on file  Tobacco Use  . Smoking status: Never Smoker  . Smokeless tobacco: Never Used  Vaping Use  . Vaping Use: Never used  Substance and Sexual Activity  . Alcohol use: Yes    Alcohol/week: 10.0 standard drinks    Types: 10 Glasses of wine per week  . Drug use: Never  . Sexual activity: Yes  Other Topics Concern  . Not on file  Social History Narrative  . Not on file   Social Determinants of Health   Financial Resource Strain:   . Difficulty of Paying Living Expenses:   Food Insecurity:     . Worried About Charity fundraiser in the Last Year:   . Arboriculturist in the Last Year:   Transportation Needs:   . Film/video editor (Medical):   Marland Kitchen Lack of Transportation (Non-Medical):   Physical Activity:   . Days of Exercise per Week:   . Minutes of Exercise per Session:   Stress:   . Feeling of Stress :   Social Connections:   . Frequency of Communication with Friends and Family:   . Frequency of Social Gatherings with Friends and Family:   . Attends Religious Services:   . Active Member of Clubs or Organizations:   . Attends Archivist Meetings:   Marland Kitchen Marital Status:   Intimate Partner Violence:   . Fear of Current or Ex-Partner:   . Emotionally Abused:   Marland Kitchen Physically Abused:   . Sexually Abused:    Family Status  Relation Name Status  . Mother unknown Deceased  . Father  Alive  . Annamarie Major  Deceased  . PGM  (Not Specified)  . PGF  (Not Specified)   Family History  Problem Relation Age of Onset  . Healthy Father   . Cancer Paternal Uncle   . Stroke Paternal Grandmother   . Heart attack Paternal Grandfather    Allergies  Allergen Reactions  . Other     Cats, Mold and Feathers  . Prednisone     Other reaction(s): Vomiting  . Tape        Medications: Outpatient Medications Prior to Visit  Medication Sig  . atorvastatin (LIPITOR) 40 MG tablet   . cholecalciferol (VITAMIN D3) 25 MCG (1000 UNIT) tablet Take 4,000 Units by mouth daily.  . clonazePAM (KLONOPIN) 0.5 MG tablet Take 0.5 mg by mouth daily as needed.  . fluconazole (DIFLUCAN) 150 MG tablet Take 1 tablet (150 mg total) by mouth as directed. Take one tablet by mouth on day 1. May repeat dose of one tablet by mouth on day four.  Marland Kitchen ibuprofen (ADVIL) 600 MG tablet Take 1 tablet (600 mg total) by mouth every 8 (eight) hours as needed for moderate pain.  Marland Kitchen ibuprofen (ADVIL) 800 MG tablet Take 800 mg by mouth every 6 (six) hours as needed.  Marland Kitchen levothyroxine (SYNTHROID) 50 MCG tablet Take 50  mcg by mouth every morning.  . Omega-3 Fatty Acids (FISH OIL) 1000 MG CAPS Take 1,000 mg by mouth.  . phenazopyridine (PYRIDIUM) 200 MG tablet Take 200 mg by mouth 3 (three) times daily as needed for pain.   Marland Kitchen esomeprazole (NEXIUM) 40 MG capsule  (Patient not taking: Reported on 08/22/2019)   No facility-administered medications prior to visit.    Review of Systems  Constitutional: Negative.   HENT: Negative.   Respiratory: Negative.  Negative for cough and shortness of  breath.   Cardiovascular: Negative for chest pain, palpitations and leg swelling.  Genitourinary: Negative.   Musculoskeletal: Positive for back pain. Negative for arthralgias, gait problem, joint swelling, myalgias and neck pain.  Skin: Negative.   Neurological: Negative for dizziness and headaches.  Hematological: Negative for adenopathy. Does not bruise/bleed easily.  Psychiatric/Behavioral: Negative for agitation and behavioral problems. The patient is nervous/anxious (prefers to continue 0.5 mg of Klonopin only takes 0.5 mg PRN and not everyday. Has taken for years ).     Last CBC Lab Results  Component Value Date   WBC 6.9 07/15/2019   HGB 13.3 07/15/2019   HCT 40.0 07/15/2019   MCV 97 07/15/2019   MCH 32.1 07/15/2019   RDW 12.3 07/15/2019   PLT 359 94/49/6759   Last metabolic panel Lab Results  Component Value Date   GLUCOSE 79 07/15/2019   NA 138 07/15/2019   K 4.4 07/15/2019   CL 102 07/15/2019   CO2 23 07/15/2019   BUN 12 07/15/2019   CREATININE 0.57 07/15/2019   GFRNONAA 103 07/15/2019   GFRAA 119 07/15/2019   CALCIUM 9.4 07/15/2019   PROT 7.1 07/15/2019   ALBUMIN 4.3 07/15/2019   LABGLOB 3.3 07/15/2019   LABGLOB 2.8 07/15/2019   AGRATIO 1.5 07/15/2019   BILITOT 0.4 07/15/2019   ALKPHOS 99 07/15/2019   AST 34 07/15/2019   ALT 16 07/15/2019   ANIONGAP 8 07/09/2019   Last lipids Lab Results  Component Value Date   CHOL 208 (H) 07/15/2019   HDL 68 07/15/2019   LDLCALC 101 (H)  07/15/2019   TRIG 230 (H) 07/15/2019   Last hemoglobin A1c No results found for: HGBA1C Last thyroid functions Lab Results  Component Value Date   TSH 1.910 07/15/2019   Last vitamin D Lab Results  Component Value Date   VD25OH 32.2 07/15/2019   Last vitamin B12 and Folate No results found for: VITAMINB12, FOLATE    Objective    BP 138/74 (BP Location: Right Arm, Patient Position: Sitting, Cuff Size: Normal)   Pulse (!) 106   Temp (!) 97.1 F (36.2 C) (Skin)   Wt 160 lb (72.6 kg)   SpO2 99%   BMI 28.34 kg/m  BP Readings from Last 3 Encounters:  08/26/19 138/74  08/09/19 119/86  07/27/19 110/74      Physical Exam Vitals reviewed.  Constitutional:      General: She is not in acute distress.    Appearance: Normal appearance. She is well-developed. She is not diaphoretic.     Interventions: She is not intubated. HENT:     Head: Normocephalic and atraumatic.     Right Ear: External ear normal.     Left Ear: External ear normal.     Nose: Nose normal.     Mouth/Throat:     Pharynx: No oropharyngeal exudate.  Eyes:     General: Lids are normal. No scleral icterus.       Right eye: No discharge.        Left eye: No discharge.     Conjunctiva/sclera: Conjunctivae normal.     Right eye: Right conjunctiva is not injected. No exudate or hemorrhage.    Left eye: Left conjunctiva is not injected. No exudate or hemorrhage.    Pupils: Pupils are equal, round, and reactive to light.  Neck:     Thyroid: No thyroid mass or thyromegaly.     Vascular: Normal carotid pulses. No carotid bruit, hepatojugular reflux or JVD.  Trachea: Trachea and phonation normal. No tracheal tenderness or tracheal deviation.     Meningeal: Brudzinski's sign and Kernig's sign absent.  Cardiovascular:     Rate and Rhythm: Normal rate and regular rhythm.     Pulses: Normal pulses.          Radial pulses are 2+ on the right side and 2+ on the left side.       Dorsalis pedis pulses are 2+ on the  right side and 2+ on the left side.       Posterior tibial pulses are 2+ on the right side and 2+ on the left side.     Heart sounds: Normal heart sounds, S1 normal and S2 normal. Heart sounds not distant. No murmur heard.  No friction rub. No gallop.   Pulmonary:     Effort: Pulmonary effort is normal. No tachypnea, bradypnea, accessory muscle usage or respiratory distress. She is not intubated.     Breath sounds: Normal breath sounds. No stridor. No wheezing or rales.  Chest:     Chest wall: No tenderness.  Abdominal:     General: Bowel sounds are normal. There is no distension or abdominal bruit.     Palpations: Abdomen is soft. There is no shifting dullness, fluid wave, hepatomegaly, splenomegaly, mass or pulsatile mass.     Tenderness: There is no abdominal tenderness. There is no right CVA tenderness, left CVA tenderness, guarding or rebound.     Hernia: No hernia is present.  Musculoskeletal:        General: No deformity. Normal range of motion.       Arms:     Cervical back: Normal, full passive range of motion without pain, normal range of motion and neck supple. No edema, erythema or rigidity. No spinous process tenderness or muscular tenderness. Normal range of motion.     Thoracic back: Spasms and tenderness present. No edema or signs of trauma.     Lumbar back: Normal.     Comments: No rash - mild tenderness   Lymphadenopathy:     Head:     Right side of head: No submental, submandibular, tonsillar, preauricular, posterior auricular or occipital adenopathy.     Left side of head: No submental, submandibular, tonsillar, preauricular, posterior auricular or occipital adenopathy.     Cervical: No cervical adenopathy.     Right cervical: No superficial, deep or posterior cervical adenopathy.    Left cervical: No superficial, deep or posterior cervical adenopathy.     Upper Body:     Right upper body: No supraclavicular or pectoral adenopathy.     Left upper body: No  supraclavicular or pectoral adenopathy.  Skin:    General: Skin is warm and dry.     Coloration: Skin is not pale.     Findings: No abrasion, bruising, burn, ecchymosis, erythema, lesion, petechiae or rash.     Nails: There is no clubbing.  Neurological:     Mental Status: She is alert and oriented to person, place, and time.     GCS: GCS eye subscore is 4. GCS verbal subscore is 5. GCS motor subscore is 6.     Cranial Nerves: No cranial nerve deficit.     Sensory: No sensory deficit.     Motor: No weakness, tremor, atrophy, abnormal muscle tone or seizure activity.     Coordination: Coordination normal.     Gait: Gait normal.     Deep Tendon Reflexes: Reflexes are normal and symmetric. Reflexes normal.  Babinski sign absent on the right side. Babinski sign absent on the left side.     Reflex Scores:      Tricep reflexes are 2+ on the right side and 2+ on the left side.      Bicep reflexes are 2+ on the right side and 2+ on the left side.      Brachioradialis reflexes are 2+ on the right side and 2+ on the left side.      Patellar reflexes are 2+ on the right side and 2+ on the left side.      Achilles reflexes are 2+ on the right side and 2+ on the left side. Psychiatric:        Speech: Speech normal.        Behavior: Behavior normal.        Thought Content: Thought content normal.        Judgment: Judgment normal.      No results found for any visits on 08/26/19.  Assessment & Plan     Midline thoracic back pain, unspecified chronicity - Plan: Ambulatory referral to General Surgery  Large breasts - Plan: Ambulatory referral to General Surgery  History of bilateral breast implants - Plan: Ambulatory referral to General Surgery  Return for lab work as previously recommended with last lab results.  Patient's MRI was normal for thoracic spine, patient is concerned about her past breast implants bilaterally been present for 20 years.  She also has pain related to her breast that  are large and sagging at this time.  She is unable to wear a bra.  She is having increasing discomfort over the last few years.  She would like a referral to a Education officer, environmental.  She has her mammogram scheduled.  Denies any breast discharge breast dimpling or pain in the breast other than her usual from large breasts she reports. Orders Placed This Encounter  Procedures  . Ambulatory referral to General Surgery    Referral Priority:   Routine    Referral Type:   Surgical    Referral Reason:   Specialty Services Required    Referred to Provider:   Rolm Bookbinder, MD    Requested Specialty:   General Surgery    Number of Visits Requested:   1   Please let me know if you do not hear from [place] [facility] within 2 weeks regarding an appointment for the future.  She follows up with oncology in 1 year, was felt to not have multiple myeloma, tumor board reviewed imaging of MRI and labs.  She has no other concerns at this time. Discussed options of may be discontinuing Klonopin slowly and starting an SSRI, or other antidepressant anti- anxiety medication due to side effects of benzodiazepines.  Patient declines at this time says she is taking Klonopin 0.5 mg as needed as needed for sleep and help with anxiety for years and prefers to continue this.  She reports she does not take it every day.  She is aware of known side effects and would like to continue it at this time and will let me know if she decides she would like to try with the antidepressant medications.  Return in about 6 months (around 02/25/2020), or if symptoms worsen or fail to improve, for at any time for any worsening symptoms, Go to Emergency room/ urgent care if worse.     Advised patient call the office or your primary care doctor for an appointment if no improvement within 72  hours or if any symptoms change or worsen at any time  Advised ER or urgent Care if after hours or on weekend. Call 911 for emergency symptoms at any  time.Patinet verbalized understanding of all instructions given/reviewed and treatment plan and has no further questions or concerns at this time.     [place] Gabriellia Rempel, FNP, have reviewed all documentation for this visit. The documentation on 08/26/19 for the exam, diagnosis, procedures, and orders are all accurate and complete.   Marcille Buffy, [place] [phone] (phone) [phone] (fax)  [place]

## 2019-09-12 MED FILL — LEVOTHYROXINE 50 MCG TABLET: 50 | 90 days supply | Qty: 90 | Fill #1

## 2019-09-15 ENCOUNTER — Ambulatory Visit
Admission: RE | Admit: 2019-09-15 | Discharge: 2019-09-15 | Disposition: A | Discharge status: Home / Self Care | Payer: No Typology Code available for payment source | Source: Ambulatory Visit | Attending: Obstetrics and Gynecology | Admitting: Obstetrics and Gynecology

## 2019-09-15 ENCOUNTER — Other Ambulatory Visit: Payer: Self-pay | Admitting: Obstetrics and Gynecology

## 2019-09-15 ENCOUNTER — Encounter: Payer: Self-pay | Admitting: Adult Health

## 2019-09-15 DIAGNOSIS — Z1231 Encounter for screening mammogram for malignant neoplasm of breast: Secondary | ICD-10-CM | POA: Insufficient documentation

## 2019-09-16 ENCOUNTER — Telehealth: Payer: Self-pay | Admitting: Adult Health

## 2019-09-21 ENCOUNTER — Other Ambulatory Visit: Payer: No Typology Code available for payment source

## 2019-09-22 ENCOUNTER — Encounter: Payer: Self-pay | Admitting: Obstetrics and Gynecology

## 2019-09-23 MED FILL — ATORVASTATIN CALCIUM 40 MG: 40 | 90 days supply | Qty: 90 | Fill #1

## 2019-10-05 MED FILL — clonazePAM 0.5 MG TABS: 0.5 | 30 days supply | Qty: 30 | Fill #1

## 2019-10-17 MED FILL — ESOMEPRAZOLE MAG DR 40 MG C: 40 | 90 days supply | Qty: 90 | Fill #1

## 2019-11-15 ENCOUNTER — Other Ambulatory Visit: Payer: Self-pay | Admitting: Adult Health

## 2019-11-15 MED FILL — IBUPROFEN 800 MG TAB: 800 | 10 days supply | Qty: 30 | Fill #3

## 2019-11-15 NOTE — Telephone Encounter (Signed)
Requested medication (s) are due for refill today: yes  Requested medication (s) are on the active medication list: yes  Last refill:  last refilled by historical provider  Future visit scheduled: no  Notes to clinic:  Please review for refill. Refill not delegated per protocol    Requested Prescriptions  Pending Prescriptions Disp Refills   clonazePAM (KLONOPIN) 0.5 MG tablet [Pharmacy Med Name: clonazePAM 0.5 MG TABS 0.5 Tablet] 30 tablet 1    Sig: TAKE 1/2 TABLET BY MOUTH TWICE DAILY      Not Delegated - Psychiatry:  Anxiolytics/Hypnotics Failed - 11/15/2019  1:33 PM      Failed - This refill cannot be delegated      Failed - Urine Drug Screen completed in last 360 days.      Passed - Valid encounter within last 6 months    Recent Outpatient Visits           2 months ago Midline thoracic back pain, unspecified chronicity   [place], FNP   4 months ago Routine health maintenance   [place], Kelby Aline, FNP

## 2019-11-16 ENCOUNTER — Encounter: Payer: Self-pay | Admitting: Adult Health

## 2019-11-16 NOTE — Telephone Encounter (Signed)
Seems to be filled by [facility] typically. Never filled by our clinic

## 2019-11-18 MED ORDER — CLONAZEPAM 0.5 MG PO TABS
0.5000 mg | ORAL_TABLET | Freq: Every day | ORAL | 0 refills | Status: DC | PRN
Start: 2019-11-18 — End: 2020-01-03

## 2019-11-18 MED FILL — clonazePAM 0.5 MG TABS: 0.5 | 30 days supply | Qty: 30 | Fill #0

## 2019-12-06 MED FILL — LEVOTHYROXINE 50 MCG TABLET: 50 | 90 days supply | Qty: 90 | Fill #2

## 2019-12-08 ENCOUNTER — Encounter: Payer: Self-pay | Admitting: Adult Health

## 2019-12-08 ENCOUNTER — Telehealth: Payer: Self-pay

## 2019-12-08 DIAGNOSIS — M546 Pain in thoracic spine: Secondary | ICD-10-CM

## 2019-12-08 MED ORDER — IBUPROFEN 600 MG PO TABS: 600.0000 mg | ORAL_TABLET | Freq: Three times a day (TID) | ORAL | 0 refills | Status: DC | PRN

## 2019-12-08 MED FILL — IBUPROFEN 600 MG TABLET: 600 | 6 days supply | Qty: 20 | Fill #0

## 2019-12-08 NOTE — Telephone Encounter (Signed)
Refill sent to patients pharmacy. 

## 2019-12-26 MED FILL — ATORVASTATIN 40 MG TABLET: 40 | 90 days supply | Qty: 90 | Fill #2

## 2019-12-27 ENCOUNTER — Encounter: Payer: Self-pay | Admitting: Adult Health

## 2019-12-29 ENCOUNTER — Other Ambulatory Visit: Payer: Self-pay | Admitting: Adult Health

## 2019-12-29 DIAGNOSIS — R239 Unspecified skin changes: Secondary | ICD-10-CM

## 2019-12-29 NOTE — Progress Notes (Signed)
See Mychart message.  offered evaluation in office declined. Patient requests dermatology.   Orders Placed This Encounter  Procedures  . Ambulatory referral to Dermatology    Referral Priority:   Routine    Referral Type:   Consultation    Referral Reason:   Specialty Services Required    Requested Specialty:   Dermatology    Number of Visits Requested:   1   Advised patient call the office or your primary care doctor for an appointment if no improvement within 72 hours or if any symptoms change or worsen at any time  Advised ER or urgent Care if after hours or on weekend. Call 911 for emergency symptoms at any time.Patinet verbalized understanding of all instructions given/reviewed and treatment plan and has no further questions or concerns at this time.

## 2020-01-03 ENCOUNTER — Encounter: Payer: Self-pay | Admitting: Adult Health

## 2020-01-03 ENCOUNTER — Other Ambulatory Visit: Payer: Self-pay

## 2020-01-03 ENCOUNTER — Ambulatory Visit: Payer: No Typology Code available for payment source | Admitting: Adult Health

## 2020-01-03 ENCOUNTER — Ambulatory Visit (INDEPENDENT_AMBULATORY_CARE_PROVIDER_SITE_OTHER): Payer: No Typology Code available for payment source | Admitting: Adult Health

## 2020-01-03 ENCOUNTER — Other Ambulatory Visit: Payer: Self-pay | Admitting: Adult Health

## 2020-01-03 VITALS — BP 119/72 | HR 84 | Temp 98.2°F | Resp 16 | Wt 159.2 lb

## 2020-01-03 DIAGNOSIS — R140 Abdominal distension (gaseous): Secondary | ICD-10-CM | POA: Diagnosis not present

## 2020-01-03 DIAGNOSIS — Z1211 Encounter for screening for malignant neoplasm of colon: Secondary | ICD-10-CM | POA: Diagnosis not present

## 2020-01-03 DIAGNOSIS — L989 Disorder of the skin and subcutaneous tissue, unspecified: Secondary | ICD-10-CM

## 2020-01-03 DIAGNOSIS — K635 Polyp of colon: Secondary | ICD-10-CM | POA: Diagnosis not present

## 2020-01-03 DIAGNOSIS — E039 Hypothyroidism, unspecified: Secondary | ICD-10-CM

## 2020-01-03 DIAGNOSIS — Z6828 Body mass index (BMI) 28.0-28.9, adult: Secondary | ICD-10-CM

## 2020-01-03 DIAGNOSIS — E785 Hyperlipidemia, unspecified: Secondary | ICD-10-CM

## 2020-01-03 MED ORDER — IBUPROFEN 800 MG PO TABS
800.0000 mg | ORAL_TABLET | Freq: Every day | ORAL | 0 refills | Status: DC | PRN
Start: 2020-01-03 — End: 2020-01-03

## 2020-01-03 MED ORDER — CLONAZEPAM 0.5 MG PO TABS
0.5000 mg | ORAL_TABLET | Freq: Every day | ORAL | 1 refills | Status: DC | PRN
Start: 2020-01-03 — End: 2020-01-03

## 2020-01-03 MED FILL — clonazePAM 0.5 MG TABS: 0.5 | 90 days supply | Qty: 90 | Fill #0

## 2020-01-03 MED FILL — IBUPROFEN 800 MG TAB: 800 | 90 days supply | Qty: 90 | Fill #0

## 2020-01-03 NOTE — Patient Instructions (Addendum)
Abdominal Bloating When you have abdominal bloating, your abdomen may feel full, tight, or painful. It may also look bigger than normal or swollen (distended). Common causes of abdominal bloating include:  Swallowing air.  Constipation.  Problems digesting food.  Eating too much.  Irritable bowel syndrome. This is a condition that affects the large intestine.  Lactose intolerance. This is an inability to digest lactose, a natural sugar in dairy products.  Celiac disease. This is a condition that affects the ability to digest gluten, a protein found in some grains.  Gastroparesis. This is a condition that slows down the movement of food in the stomach and small intestine. It is more common in people with diabetes mellitus.  Gastroesophageal reflux disease (GERD). This is a digestive condition that makes stomach acid flow back into the esophagus.  Urinary retention. This means that the body is holding onto urine, and the bladder cannot be emptied all the way. Follow these instructions at home: Eating and drinking  Avoid eating too much.  Try not to swallow air while talking or eating.  Avoid eating while lying down.  Avoid these foods and drinks: ? Foods that cause gas, such as broccoli, cabbage, cauliflower, and baked beans. ? Carbonated drinks. ? Hard candy. ? Chewing gum. Medicines  Take over-the-counter and prescription medicines only as told by your health care provider.  Take probiotic medicines. These medicines contain live bacteria or yeasts that can help digestion.  Take coated peppermint oil capsules. Activity  Try to exercise regularly. Exercise may help to relieve bloating that is caused by gas and relieve constipation. General instructions  Keep all follow-up visits as told by your health care provider. This is important. Contact a health care provider if:  You have nausea and vomiting.  You have diarrhea.  You have abdominal pain.  You have unusual  weight loss or weight gain.  You have severe pain, and medicines do not help. Get help right away if:  You have severe chest pain.  You have trouble breathing.  You have shortness of breath.  You have trouble urinating.  You have darker urine than normal.  You have blood in your stools or have dark, tarry stools. Summary  Abdominal bloating means that the abdomen is swollen.  Common causes of abdominal bloating are swallowing air, constipation, and problems digesting food.  Avoid eating too much and avoid swallowing air.  Avoid foods that cause gas, carbonated drinks, hard candy, and chewing gum. This information is not intended to replace advice given to you by your health care provider. Make sure you discuss any questions you have with your health care provider. Document Revised: 06/14/2018 Document Reviewed: 03/28/2016 Elsevier Patient Education  2020 [place] [employer] Calories are units of energy. Your body needs a certain amount of calories from food to keep you going throughout the day. When you eat more calories than your body needs, your body stores the extra calories as fat. When you eat fewer calories than your body needs, your body burns fat to get the energy it needs. Calorie counting means keeping track of how many calories you eat and drink each day. Calorie counting can be helpful if you need to lose weight. If you make sure to eat fewer calories than your body needs, you should lose weight. Ask your health care provider what a healthy weight is for you. For calorie counting to work, you will need to eat the right number of calories  in a day in order to lose a healthy amount of weight per week. A dietitian can help you determine how many calories you need in a day and will give you suggestions on how to reach your calorie goal.  A healthy amount of weight to lose per week is usually 1-2 lb (0.5-0.9 kg). This usually means that your  daily calorie intake should be reduced by 500-750 calories.  Eating 1,200 - 1,500 calories per day can help most women lose weight.  Eating 1,500 - 1,800 calories per day can help most men lose weight. What is my plan? My goal is to have __________ calories per day. If I have this many calories per day, I should lose around __________ pounds per week. What do I need to know about calorie counting? In order to meet your daily calorie goal, you will need to:  Find out how many calories are in each food you would like to eat. Try to do this before you eat.  Decide how much of the food you plan to eat.  Write down what you ate and how many calories it had. Doing this is called keeping a food log. To successfully lose weight, it is important to balance calorie counting with a healthy lifestyle that includes regular activity. Aim for 150 minutes of moderate exercise (such as walking) or 75 minutes of vigorous exercise (such as running) each week. Where do I find calorie information?  The number of calories in a food can be found on a Nutrition Facts label. If a food does not have a Nutrition Facts label, try to look up the calories online or ask your dietitian for help. Remember that calories are listed per serving. If you choose to have more than one serving of a food, you will have to multiply the calories per serving by the amount of servings you plan to eat. For example, the label on a package of bread might say that a serving size is 1 slice and that there are 90 calories in a serving. If you eat 1 slice, you will have eaten 90 calories. If you eat 2 slices, you will have eaten 180 calories. How do I keep a food log? Immediately after each meal, record the following information in your food log:  What you ate. Don't forget to include toppings, sauces, and other extras on the food.  How much you ate. This can be measured in cups, ounces, or number of items.  How many calories each food and  drink had.  The total number of calories in the meal. Keep your food log near you, such as in a small notebook in your pocket, or use a mobile app or website. Some programs will calculate calories for you and show you how many calories you have left for the day to meet your goal. What are some calorie counting tips?   Use your calories on foods and drinks that will fill you up and not leave you hungry: ? Some examples of foods that fill you up are nuts and nut butters, vegetables, lean proteins, and high-fiber foods like whole grains. High-fiber foods are foods with more than 5 g fiber per serving. ? Drinks such as sodas, specialty coffee drinks, alcohol, and juices have a lot of calories, yet do not fill you up.  Eat nutritious foods and avoid empty calories. Empty calories are calories you get from foods or beverages that do not have many vitamins or protein, such as candy, sweets,  and soda. It is better to have a nutritious high-calorie food (such as an avocado) than a food with few nutrients (such as a bag of chips).  Know how many calories are in the foods you eat most often. This will help you calculate calorie counts faster.  Pay attention to calories in drinks. Low-calorie drinks include water and unsweetened drinks.  Pay attention to nutrition labels for "low fat" or "fat free" foods. These foods sometimes have the same amount of calories or more calories than the full fat versions. They also often have added sugar, starch, or salt, to make up for flavor that was removed with the fat.  Find a way of tracking calories that works for you. Get creative. Try different apps or programs if writing down calories does not work for you. What are some portion control tips?  Know how many calories are in a serving. This will help you know how many servings of a certain food you can have.  Use a measuring cup to measure serving sizes. You could also try weighing out portions on a kitchen scale.  With time, you will be able to estimate serving sizes for some foods.  Take some time to put servings of different foods on your favorite plates, bowls, and cups so you know what a serving looks like.  Try not to eat straight from a bag or box. Doing this can lead to overeating. Put the amount you would like to eat in a cup or on a plate to make sure you are eating the right portion.  Use smaller plates, glasses, and bowls to prevent overeating.  Try not to multitask (for example, watch TV or use your computer) while eating. If it is time to eat, sit down at a table and enjoy your food. This will help you to know when you are full. It will also help you to be aware of what you are eating and how much you are eating. What are tips for following this plan? Reading food labels  Check the calorie count compared to the serving size. The serving size may be smaller than what you are used to eating.  Check the source of the calories. Make sure the food you are eating is high in vitamins and protein and low in saturated and trans fats. Shopping  Read nutrition labels while you shop. This will help you make healthy decisions before you decide to purchase your food.  Make a grocery list and stick to it. Cooking  Try to cook your favorite foods in a healthier way. For example, try baking instead of frying.  Use low-fat dairy products. Meal planning  Use more fruits and vegetables. Half of your plate should be fruits and vegetables.  Include lean proteins like poultry and fish. How do I count calories when eating out?  Ask for smaller portion sizes.  Consider sharing an entree and sides instead of getting your own entree.  If you get your own entree, eat only half. Ask for a box at the beginning of your meal and put the rest of your entree in it so you are not tempted to eat it.  If calories are listed on the menu, choose the lower calorie options.  Choose dishes that include vegetables,  fruits, whole grains, low-fat dairy products, and lean protein.  Choose items that are boiled, broiled, grilled, or steamed. Stay away from items that are buttered, battered, fried, or served with cream sauce. Items labeled "crispy" are usually  fried, unless stated otherwise.  Choose water, low-fat milk, unsweetened iced tea, or other drinks without added sugar. If you want an alcoholic beverage, choose a lower calorie option such as a glass of wine or light beer.  Ask for dressings, sauces, and syrups on the side. These are usually high in calories, so you should limit the amount you eat.  If you want a salad, choose a garden salad and ask for grilled meats. Avoid extra toppings like bacon, cheese, or fried items. Ask for the dressing on the side, or ask for olive oil and vinegar or lemon to use as dressing.  Estimate how many servings of a food you are given. For example, a serving of cooked rice is  cup or about the size of half a baseball. Knowing serving sizes will help you be aware of how much food you are eating at restaurants. The list below tells you how big or small some common portion sizes are based on everyday objects: ? 1 oz--4 stacked dice. ? 3 oz--1 deck of cards. ? 1 tsp--1 die. ? 1 Tbsp-- a ping-pong ball. ? 2 Tbsp--1 ping-pong ball. ?  cup-- baseball. ? 1 cup--1 baseball. Summary  Calorie counting means keeping track of how many calories you eat and drink each day. If you eat fewer calories than your body needs, you should lose weight.  A healthy amount of weight to lose per week is usually 1-2 lb (0.5-0.9 kg). This usually means reducing your daily calorie intake by 500-750 calories.  The number of calories in a food can be found on a Nutrition Facts label. If a food does not have a Nutrition Facts label, try to look up the calories online or ask your dietitian for help.  Use your calories on foods and drinks that will fill you up, and not on foods and drinks that  will leave you hungry.  Use smaller plates, glasses, and bowls to prevent overeating. This information is not intended to replace advice given to you by your health care provider. Make sure you discuss any questions you have with your health care provider. Document Revised: 11/13/2017 Document Reviewed: 01/25/2016 Elsevier Patient Education  [place] and Cholesterol Restricted Eating Plan Getting too much fat and cholesterol in your diet may cause health problems. Choosing the right foods helps keep your fat and cholesterol at normal levels. This can keep you from getting certain diseases. Your doctor may recommend an eating plan that includes:  Total fat: ______% or less of total calories a day.  Saturated fat: ______% or less of total calories a day.  Cholesterol: less than _________mg a day.  Fiber: ______g a day. What are tips for following this plan? Meal planning  At meals, divide your plate into four equal parts: ? Fill one-half of your plate with vegetables and green salads. ? Fill one-fourth of your plate with whole grains. ? Fill one-fourth of your plate with low-fat (lean) protein foods.  Eat fish that is high in omega-3 fats at least two times a week. This includes mackerel, tuna, sardines, and salmon.  Eat foods that are high in fiber, such as whole grains, beans, apples, broccoli, carrots, peas, and barley. General tips   Work with your doctor to lose weight if you need to.  Avoid: ? Foods with added sugar. ? Fried foods. ? Foods with partially hydrogenated oils.  Limit alcohol intake to no more than 1 drink a day for nonpregnant women and  2 drinks a day for men. One drink equals 12 oz of beer, 5 oz of wine, or 1 oz of hard liquor. Reading food labels  Check food labels for: ? Trans fats. ? Partially hydrogenated oils. ? Saturated fat (g) in each serving. ? Cholesterol (mg) in each serving. ? Fiber (g) in each serving.  Choose foods  with healthy fats, such as: ? Monounsaturated fats. ? Polyunsaturated fats. ? Omega-3 fats.  Choose grain products that have whole grains. Look for the word "whole" as the first word in the ingredient list. Cooking  Cook foods using low-fat methods. These include baking, boiling, grilling, and broiling.  Eat more home-cooked foods. Eat at restaurants and buffets less often.  Avoid cooking using saturated fats, such as butter, cream, palm oil, palm kernel oil, and coconut oil. Recommended foods  Fruits  All fresh, canned (in natural juice), or frozen fruits. Vegetables  Fresh or frozen vegetables (raw, steamed, roasted, or grilled). Green salads. Grains  Whole grains, such as whole wheat or whole grain breads, crackers, cereals, and pasta. Unsweetened oatmeal, bulgur, barley, quinoa, or brown rice. Corn or whole wheat flour tortillas. Meats and other protein foods  Ground beef (85% or leaner), grass-fed beef, or beef trimmed of fat. Skinless chicken or [place]. Ground chicken or [place]. Pork trimmed of fat. All fish and seafood. Egg whites. Dried beans, peas, or lentils. Unsalted nuts or seeds. Unsalted canned beans. Nut butters without added sugar or oil. Dairy  Low-fat or nonfat dairy products, such as skim or 1% milk, 2% or reduced-fat cheeses, low-fat and fat-free ricotta or cottage cheese, or plain low-fat and nonfat yogurt. Fats and oils  Tub margarine without trans fats. Light or reduced-fat mayonnaise and salad dressings. Avocado. Olive, canola, sesame, or safflower oils. The items listed above may not be a complete list of foods and beverages you can eat. Contact a dietitian for more information. Foods to avoid Fruits  Canned fruit in heavy syrup. Fruit in cream or butter sauce. Fried fruit. Vegetables  Vegetables cooked in cheese, cream, or butter sauce. Fried vegetables. Grains  White bread. White pasta. White rice. Cornbread. Bagels, pastries, and croissants.  Crackers and snack foods that contain trans fat and hydrogenated oils. Meats and other protein foods  Fatty cuts of meat. Ribs, chicken wings, bacon, sausage, bologna, salami, chitterlings, fatback, hot dogs, bratwurst, and packaged lunch meats. Liver and organ meats. Whole eggs and egg yolks. Chicken and [place] with skin. Fried meat. Dairy  Whole or 2% milk, cream, half-and-half, and cream cheese. Whole milk cheeses. Whole-fat or sweetened yogurt. Full-fat cheeses. Nondairy creamers and whipped toppings. Processed cheese, cheese spreads, and cheese curds. Beverages  Alcohol. Sugar-sweetened drinks such as sodas, lemonade, and fruit drinks. Fats and oils  Butter, stick margarine, lard, shortening, ghee, or bacon fat. Coconut, palm kernel, and palm oils. Sweets and desserts  Corn syrup, sugars, honey, and molasses. Candy. Jam and jelly. Syrup. Sweetened cereals. Cookies, pies, cakes, donuts, muffins, and ice cream. The items listed above may not be a complete list of foods and beverages you should avoid. Contact a dietitian for more information. Summary  Choosing the right foods helps keep your fat and cholesterol at normal levels. This can keep you from getting certain diseases.  At meals, fill one-half of your plate with vegetables and green salads.  Eat high-fiber foods, like whole grains, beans, apples, carrots, peas, and barley.  Limit added sugar, saturated fats, alcohol, and fried foods. This information is not intended  to replace advice given to you by your health care provider. Make sure you discuss any questions you have with your health care provider. Document Revised: 10/28/2017 Document Reviewed: 11/11/2016 Elsevier Patient Education  [place].

## 2020-01-03 NOTE — Progress Notes (Signed)
Established patient visit   Patient: Kristi Carter   DOB: 11/27/1960   59 y.o. Female  MRN: [id number] Visit Date: 01/03/2020  Today's healthcare provider: Marcille Buffy, FNP   Chief Complaint  Patient presents with  . Rash   Subjective    Rash The affected locations include the left lower leg. The rash is characterized by redness. She was exposed to a new detergent/soap. Pertinent negatives include no anorexia, congestion, cough, diarrhea, eye pain, facial edema, fatigue, fever, joint pain, nail changes, rhinorrhea, shortness of breath, sore throat or vomiting. Past treatments include nothing.    Left lower leg, has lesion that is not itching she reports present x 1-2 months at least maybe longer.  She has right upper thigh red spot that has popped in one week.   Patient  denies any fever, body aches,chills, rash, chest pain, shortness of breath, nausea, vomiting, or diarrhea.  Denies dizziness, lightheadedness, pre syncopal or syncopal episodes.       Medications: Outpatient Medications Prior to Visit  Medication Sig  . atorvastatin (LIPITOR) 40 MG tablet   . cholecalciferol (VITAMIN D3) 25 MCG (1000 UNIT) tablet Take 4,000 Units by mouth daily.  Marland Kitchen levothyroxine (SYNTHROID) 50 MCG tablet Take 50 mcg by mouth every morning.  . Omega-3 Fatty Acids (FISH OIL) 1000 MG CAPS Take 1,000 mg by mouth.  . phenazopyridine (PYRIDIUM) 200 MG tablet Take 200 mg by mouth 3 (three) times daily as needed for pain.   . [DISCONTINUED] clonazePAM (KLONOPIN) 0.5 MG tablet Take 1 tablet (0.5 mg total) by mouth daily as needed.  . [DISCONTINUED] ibuprofen (ADVIL) 800 MG tablet Take 800 mg by mouth every 6 (six) hours as needed.  Marland Kitchen esomeprazole (NEXIUM) 40 MG capsule  (Patient not taking: Reported on 08/22/2019)  . [DISCONTINUED] fluconazole (DIFLUCAN) 150 MG tablet Take 1 tablet (150 mg total) by mouth as directed. Take one tablet by mouth on day 1. May repeat dose of one tablet by  mouth on day four.  . [DISCONTINUED] ibuprofen (ADVIL) 600 MG tablet Take 1 tablet (600 mg total) by mouth every 8 (eight) hours as needed for moderate pain.   No facility-administered medications prior to visit.    Review of Systems  Constitutional: Negative for fatigue and fever.  HENT: Negative for congestion, rhinorrhea and sore throat.   Eyes: Negative for pain.  Respiratory: Negative.  Negative for cough and shortness of breath.   Cardiovascular: Negative.   Gastrointestinal: Negative.  Negative for anorexia, diarrhea and vomiting.  Genitourinary: Negative.   Musculoskeletal: Negative.  Negative for joint pain.  Skin: Positive for color change and rash. Negative for nail changes.  Neurological: Negative.   Hematological: Negative.   Psychiatric/Behavioral: Negative.     Last CBC Lab Results  Component Value Date   WBC 6.1 01/03/2020   HGB 14.0 01/03/2020   HCT 39.5 01/03/2020   MCV 97 01/03/2020   MCH 34.2 (H) 01/03/2020   RDW 12.0 01/03/2020   PLT 359 67/67/2094   Last metabolic panel Lab Results  Component Value Date   GLUCOSE 82 01/03/2020   NA 141 01/03/2020   K 4.2 01/03/2020   CL 104 01/03/2020   CO2 21 01/03/2020   BUN 7 01/03/2020   CREATININE 0.56 (L) 01/03/2020   GFRNONAA 104 01/03/2020   GFRAA 120 01/03/2020   CALCIUM 9.2 01/03/2020   PROT 6.8 01/03/2020   ALBUMIN 4.3 01/03/2020   LABGLOB 2.5 01/03/2020   AGRATIO 1.7 01/03/2020  BILITOT 0.5 01/03/2020   ALKPHOS 119 01/03/2020   AST 65 (H) 01/03/2020   ALT 39 (H) 01/03/2020   ANIONGAP 8 07/09/2019   Last lipids Lab Results  Component Value Date   CHOL 206 (H) 01/03/2020   HDL 70 01/03/2020   LDLCALC 111 (H) 01/03/2020   TRIG 143 01/03/2020   Last hemoglobin A1c No results found for: HGBA1C Last thyroid functions Lab Results  Component Value Date   TSH 0.743 01/03/2020   Last vitamin D Lab Results  Component Value Date   VD25OH 32.2 07/15/2019   Last vitamin B12 and Folate No  results found for: VITAMINB12, FOLATE    Objective    BP 119/72   Pulse 84   Temp 98.2 F (36.8 C) (Oral)   Resp 16   Wt 159 lb 3.2 oz (72.2 kg)   BMI 28.20 kg/m  BP Readings from Last 3 Encounters:  01/03/20 119/72  08/26/19 138/74  08/09/19 119/86   Wt Readings from Last 3 Encounters:  01/03/20 159 lb 3.2 oz (72.2 kg)  08/26/19 160 lb (72.6 kg)  08/09/19 160 lb (72.6 kg)      Physical Exam Vitals reviewed.  Constitutional:      General: She is not in acute distress.    Appearance: Normal appearance. She is obese. She is not ill-appearing, toxic-appearing or diaphoretic.  HENT:     Head: Normocephalic and atraumatic.     Right Ear: External ear normal.     Left Ear: External ear normal.     Nose: Nose normal.     Mouth/Throat:     Mouth: Mucous membranes are moist.  Eyes:     Extraocular Movements: Extraocular movements intact.  Cardiovascular:     Rate and Rhythm: Normal rate and regular rhythm.     Pulses: Normal pulses.     Heart sounds: Normal heart sounds. No murmur heard.  No friction rub. No gallop.   Pulmonary:     Effort: Pulmonary effort is normal. No respiratory distress.     Breath sounds: Normal breath sounds. No stridor. No wheezing, rhonchi or rales.  Chest:     Chest wall: No tenderness.  Abdominal:     Palpations: Abdomen is soft.  Musculoskeletal:        General: Normal range of motion.     Cervical back: Normal range of motion.  Skin:    General: Skin is warm.     Capillary Refill: Capillary refill takes less than 2 seconds.          Comments: No erythema or drainage.   Neurological:     General: No focal deficit present.     Mental Status: She is alert and oriented to person, place, and time.     Motor: No weakness.     Gait: Gait normal.  Psychiatric:        Mood and Affect: Mood normal.        Behavior: Behavior normal.        Thought Content: Thought content normal.        Judgment: Judgment normal.       Results for  orders placed or performed in visit on 01/03/20  Lipid Panel w/o Chol/HDL Ratio  Result Value Ref Range   Cholesterol, Total 206 (H) 100 - 199 mg/dL   Triglycerides 143 0 - 149 mg/dL   HDL 70 >39 mg/dL   VLDL Cholesterol Cal 25 5 - 40 mg/dL   LDL Chol Calc (NIH) 111 (  H) 0 - 99 mg/dL  CBC With Differential  Result Value Ref Range   WBC 6.1 3.4 - 10.8 x10E3/uL   RBC 4.09 3.77 - 5.28 x10E6/uL   Hemoglobin 14.0 11.1 - 15.9 g/dL   Hematocrit 39.5 34.0 - 46.6 %   MCV 97 79 - 97 fL   MCH 34.2 (H) 26.6 - 33.0 pg   MCHC 35.4 31 - 35 g/dL   RDW 12.0 11.7 - 15.4 %   Neutrophils 63 Not Estab. %   Lymphs 24 Not Estab. %   Monocytes 8 Not Estab. %   Eos 3 Not Estab. %   Basos 1 Not Estab. %   Neutrophils Absolute 3.8 1.40 - 7.00 x10E3/uL   Lymphocytes Absolute 1.5 0 - 3 x10E3/uL   Monocytes Absolute 0.5 0 - 0 x10E3/uL   EOS (ABSOLUTE) 0.2 0.0 - 0.4 x10E3/uL   Basophils Absolute 0.1 0 - 0 x10E3/uL   Immature Granulocytes 1 Not Estab. %   Immature Grans (Abs) 0.0 0.0 - 0.1 x10E3/uL  TSH  Result Value Ref Range   TSH 0.743 0.450 - 4.500 uIU/mL  Comprehensive Metabolic Panel (CMET)  Result Value Ref Range   Glucose 82 65 - 99 mg/dL   BUN 7 6 - 24 mg/dL   Creatinine, Ser 0.56 (L) 0.57 - 1.00 mg/dL   GFR calc non Af Amer 104 >59 mL/min/1.73   GFR calc Af Amer 120 >59 mL/min/1.73   BUN/Creatinine Ratio 13 9 - 23   Sodium 141 134 - 144 mmol/L   Potassium 4.2 3.5 - 5.2 mmol/L   Chloride 104 96 - 106 mmol/L   CO2 21 20 - 29 mmol/L   Calcium 9.2 8.7 - 10.2 mg/dL   Total Protein 6.8 6.0 - 8.5 g/dL   Albumin 4.3 3.8 - 4.9 g/dL   Globulin, Total 2.5 1.5 - 4.5 g/dL   Albumin/Globulin Ratio 1.7 1.2 - 2.2   Bilirubin Total 0.5 0.0 - 1.2 mg/dL   Alkaline Phosphatase 119 44 - 121 IU/L   AST 65 (H) 0 - 40 IU/L   ALT 39 (H) 0 - 32 IU/L  Lipase  Result Value Ref Range   Lipase 41 14 - 72 U/L  Amylase  Result Value Ref Range   Amylase 50 31 - 110 U/L    Assessment & Plan     Screening for  colon cancer - Plan: Ambulatory referral to Gastroenterology  Skin lesion - Plan: Ambulatory referral to Gastroenterology, Ambulatory referral to Dermatology, CBC With Differential  Abdominal bloating - Plan: Ambulatory referral to Gastroenterology, CBC With Differential, Comprehensive Metabolic Panel (CMET), Lipase, Amylase  Intestinal polyp - Plan: Ambulatory referral to Gastroenterology  Hypothyroidism, unspecified type - Plan: TSH  Hyperlipidemia, unspecified hyperlipidemia type - Plan: Lipid Panel w/o Chol/HDL Ratio  Body mass index 28.0-28.9, adult   Orders Placed This Encounter  Procedures  . Lipid Panel w/o Chol/HDL Ratio  . CBC With Differential  . TSH  . Comprehensive Metabolic Panel (CMET)  . Lipase  . Amylase  . Ambulatory referral to Gastroenterology  . Ambulatory referral to Dermatology  dermatology for full skin body check advised.   Meds ordered this encounter  Medications  . clonazePAM (KLONOPIN) 0.5 MG tablet    Sig: Take 1 tablet (0.5 mg total) by mouth daily as needed.    Dispense:  90 tablet    Refill:  1  . ibuprofen (ADVIL) 800 MG tablet    Sig: Take 1 tablet (800 mg total) by  mouth daily as needed.    Dispense:  90 tablet    Refill:  0  discussed risks of klonopin and alternatives. She denies any adverse side effects.   Return in about 3 months (around 04/04/2020), or if symptoms worsen or fail to improve, for Go to Emergency room/ urgent care if worse, at any time for any worsening symptoms.         Marcille Buffy, [place] [phone] (phone) [phone] (fax)  [place]

## 2020-01-04 LAB — COMPREHENSIVE METABOLIC PANEL WITH GFR
ALT: 39 IU/L — ABNORMAL HIGH (ref 0–32)
AST: 65 IU/L — ABNORMAL HIGH (ref 0–40)
Albumin/Globulin Ratio: 1.7 (ref 1.2–2.2)
Albumin: 4.3 g/dL (ref 3.8–4.9)
Alkaline Phosphatase: 119 IU/L (ref 44–121)
BUN/Creatinine Ratio: 13 (ref 9–23)
BUN: 7 mg/dL (ref 6–24)
Bilirubin Total: 0.5 mg/dL (ref 0.0–1.2)
CO2: 21 mmol/L (ref 20–29)
Calcium: 9.2 mg/dL (ref 8.7–10.2)
Chloride: 104 mmol/L (ref 96–106)
Creatinine, Ser: 0.56 mg/dL — ABNORMAL LOW (ref 0.57–1.00)
GFR calc Af Amer: 120 mL/min/1.73
GFR calc non Af Amer: 104 mL/min/1.73
Globulin, Total: 2.5 g/dL (ref 1.5–4.5)
Glucose: 82 mg/dL (ref 65–99)
Potassium: 4.2 mmol/L (ref 3.5–5.2)
Sodium: 141 mmol/L (ref 134–144)
Total Protein: 6.8 g/dL (ref 6.0–8.5)

## 2020-01-04 LAB — CBC WITH DIFFERENTIAL
Basophils Absolute: 0.1 x10E3/uL (ref 0.0–0.2)
Basos: 1 %
EOS (ABSOLUTE): 0.2 x10E3/uL (ref 0.0–0.4)
Eos: 3 %
Hematocrit: 39.5 % (ref 34.0–46.6)
Hemoglobin: 14 g/dL (ref 11.1–15.9)
Immature Grans (Abs): 0 x10E3/uL (ref 0.0–0.1)
Immature Granulocytes: 1 %
Lymphocytes Absolute: 1.5 x10E3/uL (ref 0.7–3.1)
Lymphs: 24 %
MCH: 34.2 pg — ABNORMAL HIGH (ref 26.6–33.0)
MCHC: 35.4 g/dL (ref 31.5–35.7)
MCV: 97 fL (ref 79–97)
Monocytes Absolute: 0.5 x10E3/uL (ref 0.1–0.9)
Monocytes: 8 %
Neutrophils Absolute: 3.8 x10E3/uL (ref 1.4–7.0)
Neutrophils: 63 %
RBC: 4.09 x10E6/uL (ref 3.77–5.28)
RDW: 12 % (ref 11.7–15.4)
WBC: 6.1 x10E3/uL (ref 3.4–10.8)

## 2020-01-04 LAB — TSH: TSH: 0.743 u[IU]/mL (ref 0.450–4.500)

## 2020-01-04 LAB — LIPID PANEL W/O CHOL/HDL RATIO
Cholesterol, Total: 206 mg/dL — ABNORMAL HIGH (ref 100–199)
HDL: 70 mg/dL (ref >39)
LDL Chol Calc (NIH): 111 mg/dL — ABNORMAL HIGH (ref 0–99)
Triglycerides: 143 mg/dL (ref 0–149)
VLDL Cholesterol Cal: 25 mg/dL (ref 5–40)

## 2020-01-04 LAB — AMYLASE: Amylase: 50 U/L (ref 31–110)

## 2020-01-04 LAB — LIPASE: Lipase: 41 U/L (ref 14–72)

## 2020-01-05 NOTE — Progress Notes (Addendum)
Lipid panel, LDL is 111 slightly higher than last check. Work on increased exercise and dietary changes. Continue current dose of statin. HDL is very good your good cholesterol.  CBC ok.  TSH -Thyroid ok.  CMP ok , liver enzymes AST/ ALT elevated. Avoid alcohol or excessive tylenol  Recheck CMP for liver enzymes in 2 months fasting before labs at least 8 hours with water only. Please add lab. Will follow up with GI for elevated AST/ ALT and bloating as well. Lipase and amylase is within normal limits.

## 2020-01-06 ENCOUNTER — Encounter: Payer: Self-pay | Admitting: Adult Health

## 2020-01-06 ENCOUNTER — Other Ambulatory Visit: Payer: Self-pay | Admitting: Adult Health

## 2020-01-06 DIAGNOSIS — M720 Palmar fascial fibromatosis [Dupuytren]: Secondary | ICD-10-CM

## 2020-01-06 DIAGNOSIS — Z6828 Body mass index (BMI) 28.0-28.9, adult: Secondary | ICD-10-CM | POA: Insufficient documentation

## 2020-01-06 NOTE — Progress Notes (Signed)
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 Ambulatory referral to Orthopedic Surgery    Referral Priority:   Routine    Referral Type:   Surgical    Referral Reason:   Specialty Services Required    Referred to Provider:   Roseanne Kaufman, MD    Requested Specialty:   Orthopedic Surgery    Number of Visits Requested:   1

## 2020-01-11 ENCOUNTER — Telehealth (INDEPENDENT_AMBULATORY_CARE_PROVIDER_SITE_OTHER): Payer: Self-pay | Admitting: Gastroenterology

## 2020-01-11 ENCOUNTER — Other Ambulatory Visit: Payer: Self-pay

## 2020-01-11 DIAGNOSIS — Z1211 Encounter for screening for malignant neoplasm of colon: Secondary | ICD-10-CM

## 2020-01-11 DIAGNOSIS — K635 Polyp of colon: Secondary | ICD-10-CM

## 2020-01-11 MED ORDER — NA SULFATE-K SULFATE-MG SULF 17.5-3.13-1.6 GM/177ML PO SOLN
1.0000 | Freq: Once | ORAL | 0 refills | Status: DC
Start: 2020-01-11 — End: 2020-01-11

## 2020-01-11 MED FILL — SUPREP BOWEL PREP KIT: 17.5-3.13-1 | 7 days supply | Qty: 354 | Fill #0

## 2020-01-11 NOTE — Progress Notes (Signed)
Gastroenterology Pre-Procedure Review  Request Date: 02/10/20 Requesting Physician: Dr. Vicente Males  PATIENT REVIEW QUESTIONS: The patient responded to the following health history questions as indicated:    1. Are you having any GI issues? yes (Abdominal bloating after meals, and drinking water.  Patient has declined office visit to discuss.) 2. Do you have a personal history of Polyps? Patient states she has had abdominal polyps, however her health maintenance states colonoscopy has never been performed.  no record of colonoscopy in chart.  3. Do you have a family history of Colon Cancer or Polyps? no 4. Diabetes Mellitus? no 5. Joint replacements in the past 12 months?no 6. Major health problems in the past 3 months?no 7. Any artificial heart valves, MVP, or defibrillator?no    MEDICATIONS & ALLERGIES:    Patient reports the following regarding taking any anticoagulation/antiplatelet therapy:   Plavix, Coumadin, Eliquis, Xarelto, Lovenox, Pradaxa, Brilinta, or Effient? no Aspirin? no  Patient confirms/reports the following medications:  Current Outpatient Medications  Medication Sig Dispense Refill  . atorvastatin (LIPITOR) 40 MG tablet     . cholecalciferol (VITAMIN D3) 25 MCG (1000 UNIT) tablet Take 4,000 Units by mouth daily.    . clonazePAM (KLONOPIN) 0.5 MG tablet Take 1 tablet (0.5 mg total) by mouth daily as needed. 90 tablet 1  . ibuprofen (ADVIL) 800 MG tablet Take 1 tablet (800 mg total) by mouth daily as needed. 90 tablet 0  . levothyroxine (SYNTHROID) 50 MCG tablet Take 50 mcg by mouth every morning.    . Multiple Vitamins-Minerals (HM MULTIVITAMIN ADULT GUMMY) CHEW Chew by mouth.    . Omega-3 Fatty Acids (FISH OIL) 1000 MG CAPS Take 1,000 mg by mouth.    . phenazopyridine (PYRIDIUM) 200 MG tablet Take 200 mg by mouth 3 (three) times daily as needed for pain.     Marland Kitchen esomeprazole (NEXIUM) 40 MG capsule  (Patient not taking: Reported on 08/22/2019)     No current  facility-administered medications for this visit.    Patient confirms/reports the following allergies:  Allergies  Allergen Reactions  . Other     Cats, Mold and Feathers  . Prednisone     Other reaction(s): Vomiting  . Tape     No orders of the defined types were placed in this encounter.   AUTHORIZATION INFORMATION Primary Insurance: 1D#: Group #:  Secondary Insurance: 1D#: Group #:  SCHEDULE INFORMATION: Date: 02/10/20 Time: Location:[facility]

## 2020-01-12 ENCOUNTER — Encounter: Payer: Self-pay | Admitting: Adult Health

## 2020-01-14 MED FILL — ESOMEPRAZOLE MAG DR 40 MG C: 40 | 90 days supply | Qty: 90 | Fill #2

## 2020-01-16 ENCOUNTER — Ambulatory Visit: Payer: No Typology Code available for payment source | Admitting: Gastroenterology

## 2020-01-24 ENCOUNTER — Other Ambulatory Visit: Payer: Self-pay

## 2020-01-24 ENCOUNTER — Encounter: Payer: Self-pay | Admitting: Adult Health

## 2020-01-24 ENCOUNTER — Ambulatory Visit (INDEPENDENT_AMBULATORY_CARE_PROVIDER_SITE_OTHER): Payer: No Typology Code available for payment source | Admitting: Plastic Surgery

## 2020-01-24 ENCOUNTER — Encounter: Payer: Self-pay | Admitting: Plastic Surgery

## 2020-01-24 VITALS — BP 117/74 | HR 95 | Temp 98.2°F | Ht 63.0 in | Wt 160.2 lb

## 2020-01-24 DIAGNOSIS — N62 Hypertrophy of breast: Secondary | ICD-10-CM | POA: Diagnosis not present

## 2020-01-24 DIAGNOSIS — M546 Pain in thoracic spine: Secondary | ICD-10-CM

## 2020-01-24 NOTE — Progress Notes (Addendum)
Patient ID: Kristi Carter, female    DOB: March 23, 1960, 59 y.o.   MRN: [id number]   Chief Complaint  Patient presents with  . Breast Problem    The patient is a 59 year old female here for evaluation of her breasts.  The patient underwent breast implant placement by Dr. Luvenia Heller 22 years ago.  She believes that they are saline implants and she thinks they are under the muscle.  They were placed through an axillary incision.  She was a AA bra size prior to the surgery and is now a ~DD cup.  She complains of neck and back pain.  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  No lumps or bumps are noted.  She does have loss of volume superiorly with ptosis of bilateral breasts at a grade 3.  Her last mammogram was this year and was within the [facility] system.  She has gained around 20 pounds during the pandemic and is interested to get this weight back off.   Review of Systems  Constitutional: Negative.   HENT: Negative.   Eyes: Negative.   Respiratory: Negative.   Cardiovascular: Negative.   Gastrointestinal: Negative.   Endocrine: Negative.   Genitourinary: Negative.   Musculoskeletal: Negative.     Past Medical History:  Diagnosis Date  . A-fib ([place])   . Allergy   . Anxiety   . GERD (gastroesophageal reflux disease)   . Hypothyroid     Past Surgical History:  Procedure Laterality Date  . AUGMENTATION MAMMAPLASTY Bilateral 1990   saline  . BRAIN SURGERY    . CARDIAC ELECTROPHYSIOLOGY MAPPING AND ABLATION        Current Outpatient Medications:  .  atorvastatin (LIPITOR) 40 MG tablet, , Disp: , Rfl:  .  cholecalciferol (VITAMIN D3) 25 MCG (1000 UNIT) tablet, Take 4,000 Units by mouth daily., Disp: , Rfl:  .  clonazePAM (KLONOPIN) 0.5 MG tablet, Take 1 tablet (0.5 mg total) by mouth daily as needed., Disp: 90 tablet, Rfl: 1 .  esomeprazole (NEXIUM) 40 MG capsule, , Disp: , Rfl:  .   ibuprofen (ADVIL) 800 MG tablet, Take 1 tablet (800 mg total) by mouth daily as needed., Disp: 90 tablet, Rfl: 0 .  levothyroxine (SYNTHROID) 50 MCG tablet, Take 50 mcg by mouth every morning., Disp: , Rfl:  .  Multiple Vitamins-Minerals (HM MULTIVITAMIN ADULT GUMMY) CHEW, Chew by mouth., Disp: , Rfl:  .  Omega-3 Fatty Acids (FISH OIL) 1000 MG CAPS, Take 1,000 mg by mouth., Disp: , Rfl:  .  phenazopyridine (PYRIDIUM) 200 MG tablet, Take 200 mg by mouth 3 (three) times daily as needed for pain. , Disp: , Rfl:    Objective:   Vitals:   01/24/20 1457  BP: 117/74  Pulse: 95  Temp: 98.2 F (36.8 C)  SpO2: 97%    Physical Exam Vitals and nursing note reviewed.  Constitutional:      Appearance: Normal appearance.  HENT:     Head: Normocephalic and atraumatic.  Cardiovascular:     Rate and Rhythm: Normal rate.     Pulses: Normal pulses.  Pulmonary:     Effort: Pulmonary effort is normal. No respiratory distress.  Abdominal:     General: Abdomen is flat. There is no distension.     Tenderness: There is no abdominal tenderness.  Neurological:     General: No focal deficit present.  Mental Status: She is alert and oriented to person, place, and time.  Psychiatric:        Mood and Affect: Mood normal.        Thought Content: Thought content normal.     Assessment & Plan:  Large breasts  Bilateral thoracic back pain, unspecified chronicity  Recommend removal of bilateral breast implants with mastopexy.  We talked about the pros and cons of placement of new implants.  I think the patient is being very cautious and conservative by not placing new implants.  She is in agreement with this plan.  Mammogram noted in chart from July 2021 and was negative.  We will make a referral to the [facility]. Pictures were obtained of the patient and placed in the chart with the patient's or guardian's permission.   [place], DO

## 2020-01-25 ENCOUNTER — Other Ambulatory Visit: Payer: Self-pay | Admitting: Adult Health

## 2020-01-25 DIAGNOSIS — L989 Disorder of the skin and subcutaneous tissue, unspecified: Secondary | ICD-10-CM

## 2020-02-10 ENCOUNTER — Ambulatory Visit: Admit: 2020-02-10 | Payer: No Typology Code available for payment source | Admitting: Gastroenterology

## 2020-02-10 SURGERY — COLONOSCOPY WITH PROPOFOL
Anesthesia: General

## 2020-02-29 MED FILL — LEVOTHYROXINE 50 MCG TABLET: 50 | 90 days supply | Qty: 90 | Fill #3

## 2020-03-07 ENCOUNTER — Telehealth: Payer: Self-pay | Admitting: Plastic Surgery

## 2020-03-07 NOTE — Telephone Encounter (Signed)
Called patient and advised of insurance denial. Patient indicated that she has obtained a quote from Dr. Ellery Plunk as well, and will decide after January 1 how she wants to proceed. She will call back to let us know.

## 2020-03-13 ENCOUNTER — Encounter: Payer: Self-pay | Admitting: Gastroenterology

## 2020-03-13 ENCOUNTER — Other Ambulatory Visit: Payer: Self-pay

## 2020-03-13 ENCOUNTER — Ambulatory Visit (INDEPENDENT_AMBULATORY_CARE_PROVIDER_SITE_OTHER): Payer: No Typology Code available for payment source | Admitting: Gastroenterology

## 2020-03-13 VITALS — BP 105/73 | HR 89 | Temp 98.0°F | Wt 160.4 lb

## 2020-03-13 DIAGNOSIS — K580 Irritable bowel syndrome with diarrhea: Secondary | ICD-10-CM | POA: Diagnosis not present

## 2020-03-13 DIAGNOSIS — Z1211 Encounter for screening for malignant neoplasm of colon: Secondary | ICD-10-CM | POA: Diagnosis not present

## 2020-03-13 DIAGNOSIS — K317 Polyp of stomach and duodenum: Secondary | ICD-10-CM | POA: Diagnosis not present

## 2020-03-13 DIAGNOSIS — Z8719 Personal history of other diseases of the digestive system: Secondary | ICD-10-CM

## 2020-03-13 NOTE — Patient Instructions (Addendum)
Low-FODMAP Eating Plan  FODMAPs (fermentable oligosaccharides, disaccharides, monosaccharides, and polyols) are sugars that are hard for some people to digest. A low-FODMAP eating plan may help some people who have bowel (intestinal) diseases to manage their symptoms. This meal plan can be complicated to follow. Work with a diet and nutrition specialist (dietitian) to make a low-FODMAP eating plan that is right for you. A dietitian can make sure that you get enough nutrition from this diet. What are tips for following this plan? Reading food labels  Check labels for hidden FODMAPs such as: ? High-fructose syrup. ? Honey. ? Agave. ? Natural fruit flavors. ? Onion or garlic powder.  Choose low-FODMAP foods that contain 3-4 grams of fiber per serving.  Check food labels for serving sizes. Eat only one serving at a time to make sure FODMAP levels stay low. Meal planning  Follow a low-FODMAP eating plan for up to 6 weeks, or as told by your health care provider or dietitian.  To follow the eating plan: 1. Eliminate high-FODMAP foods from your diet completely. 2. Gradually reintroduce high-FODMAP foods into your diet one at a time. Most people should wait a few days after introducing one high-FODMAP food before they introduce the next high-FODMAP food. Your dietitian can recommend how quickly you may reintroduce foods. 3. Keep a daily record of what you eat and drink, and make note of any symptoms that you have after eating. 4. Review your daily record with a dietitian regularly. Your dietitian can help you identify which foods you can eat and which foods you should avoid. General tips  Drink enough fluid each day to keep your urine pale yellow.  Avoid processed foods. These often have added sugar and may be high in FODMAPs.  Avoid most dairy products, whole grains, and sweeteners.  Work with a dietitian to make sure you get enough fiber in your diet. Recommended  foods Grains  Gluten-free grains, such as rice, oats, buckwheat, quinoa, corn, polenta, and millet. Gluten-free pasta, bread, or cereal. Rice noodles. Corn tortillas. Vegetables  Eggplant, zucchini, cucumber, peppers, green beans, Brussels sprouts, bean sprouts, lettuce, arugula, kale, Swiss chard, spinach, collard greens, bok choy, summer squash, potato, and tomato. Limited amounts of corn, carrot, and sweet potato. Green parts of scallions. Fruits  Bananas, oranges, lemons, limes, blueberries, raspberries, strawberries, grapes, cantaloupe, honeydew melon, kiwi, papaya, passion fruit, and pineapple. Limited amounts of dried cranberries, banana chips, and shredded coconut. Dairy  Lactose-free milk, yogurt, and kefir. Lactose-free cottage cheese and ice cream. Non-dairy milks, such as almond, coconut, hemp, and rice milk. Yogurts made of non-dairy milks. Limited amounts of goat cheese, brie, mozzarella, parmesan, swiss, and other hard cheeses. Meats and other protein foods  Unseasoned beef, pork, poultry, or fish. Eggs. Bacon. Tofu (firm) and tempeh. Limited amounts of nuts and seeds, such as almonds, walnuts, brazil nuts, pecans, peanuts, pumpkin seeds, chia seeds, and sunflower seeds. Fats and oils  Butter-free spreads. Vegetable oils, such as olive, canola, and sunflower oil. Seasoning and other foods  Artificial sweeteners with names that do not end in "ol" such as aspartame, saccharine, and stevia. Maple syrup, white table sugar, raw sugar, brown sugar, and molasses. Fresh basil, coriander, parsley, rosemary, and thyme. Beverages  Water and mineral water. Sugar-sweetened soft drinks. Small amounts of orange juice or cranberry juice. Black and green tea. Most dry wines. Coffee. This may not be a complete list of low-FODMAP foods. Talk with your dietitian for more information. Foods to avoid Grains  Wheat,   including kamut, durum, and semolina. Barley and bulgur. Couscous. Wheat-based  cereals. Wheat noodles, bread, crackers, and pastries. Vegetables  Chicory root, artichoke, asparagus, cabbage, snow peas, sugar snap peas, mushrooms, and cauliflower. Onions, garlic, leeks, and the white part of scallions. Fruits  Fresh, dried, and juiced forms of apple, pear, watermelon, peach, plum, cherries, apricots, blackberries, boysenberries, figs, nectarines, and mango. Avocado. Dairy  Milk, yogurt, ice cream, and soft cheese. Cream and sour cream. Milk-based sauces. Custard. Meats and other protein foods  Fried or fatty meat. Sausage. Cashews and pistachios. Soybeans, baked beans, black beans, chickpeas, kidney beans, fava beans, navy beans, lentils, and split peas. Seasoning and other foods  Any sugar-free gum or candy. Foods that contain artificial sweeteners such as sorbitol, mannitol, isomalt, or xylitol. Foods that contain honey, high-fructose corn syrup, or agave. Bouillon, vegetable stock, beef stock, and chicken stock. Garlic and onion powder. Condiments made with onion, such as hummus, chutney, pickles, relish, salad dressing, and salsa. Tomato paste. Beverages  Chicory-based drinks. Coffee substitutes. Chamomile tea. Fennel tea. Sweet or fortified wines such as port or sherry. Diet soft drinks made with isomalt, mannitol, maltitol, sorbitol, or xylitol. Apple, pear, and mango juice. Juices with high-fructose corn syrup. This may not be a complete list of high-FODMAP foods. Talk with your dietitian to discuss what dietary choices are best for you.  Summary  A low-FODMAP eating plan is a short-term diet that eliminates FODMAPs from your diet to help ease symptoms of certain bowel diseases.  The eating plan usually lasts up to 6 weeks. After that, high-FODMAP foods are restarted gradually, one at a time, so you can find out which may be causing symptoms.  A low-FODMAP eating plan can be complicated. It is best to work with a dietitian who has experience with this type of  plan. This information is not intended to replace advice given to you by your health care provider. Make sure you discuss any questions you have with your health care provider. Document Revised: 02/06/2017 Document Reviewed: 10/21/2016 Elsevier Patient Education  [place].  Gastroesophageal Reflux Disease, Adult Gastroesophageal reflux (GER) happens when acid from the stomach flows up into the tube that connects the mouth and the stomach (esophagus). Normally, food travels down the esophagus and stays in the stomach to be digested. With GER, food and stomach acid sometimes move back up into the esophagus. You may have a disease called gastroesophageal reflux disease (GERD) if the reflux:  Happens often.  Causes frequent or very bad symptoms.  Causes problems such as damage to the esophagus. When this happens, the esophagus becomes sore and swollen (inflamed). Over time, GERD can make small holes (ulcers) in the lining of the esophagus. What are the causes? This condition is caused by a problem with the muscle between the esophagus and the stomach. When this muscle is weak or not normal, it does not close properly to keep food and acid from coming back up from the stomach. The muscle can be weak because of:  Tobacco use.  Pregnancy.  Having a certain type of hernia (hiatal hernia).  Alcohol use.  Certain foods and drinks, such as coffee, chocolate, onions, and peppermint. What increases the risk? You are more likely to develop this condition if you:  Are overweight.  Have a disease that affects your connective tissue.  Use NSAID medicines. What are the signs or symptoms? Symptoms of this condition include:  Heartburn.  Difficult or painful swallowing.  The feeling of having a  lump in the throat.  A bitter taste in the mouth.  Bad breath.  Having a lot of saliva.  Having an upset or bloated stomach.  Belching.  Chest pain. Different conditions can cause  chest pain. Make sure you see your doctor if you have chest pain.  Shortness of breath or noisy breathing (wheezing).  Ongoing (chronic) cough or a cough at night.  Wearing away of the surface of teeth (tooth enamel).  Weight loss. How is this treated? Treatment will depend on how bad your symptoms are. Your doctor may suggest:  Changes to your diet.  Medicine.  Surgery. Follow these instructions at home: Eating and drinking   Follow a diet as told by your doctor. You may need to avoid foods and drinks such as: ? Coffee and tea (with or without caffeine). ? Drinks that contain alcohol. ? Energy drinks and sports drinks. ? Bubbly (carbonated) drinks or sodas. ? Chocolate and cocoa. ? Peppermint and mint flavorings. ? Garlic and onions. ? Horseradish. ? Spicy and acidic foods. These include peppers, chili powder, curry powder, vinegar, hot sauces, and BBQ sauce. ? Citrus fruit juices and citrus fruits, such as oranges, lemons, and limes. ? Tomato-based foods. These include red sauce, chili, salsa, and pizza with red sauce. ? Fried and fatty foods. These include donuts, french fries, potato chips, and high-fat dressings. ? High-fat meats. These include hot dogs, rib eye steak, sausage, ham, and bacon. ? High-fat dairy items, such as whole milk, butter, and cream cheese.  Eat small meals often. Avoid eating large meals.  Avoid drinking large amounts of liquid with your meals.  Avoid eating meals during the 2-3 hours before bedtime.  Avoid lying down right after you eat.  Do not exercise right after you eat. Lifestyle   Do not use any products that contain nicotine or tobacco. These include cigarettes, e-cigarettes, and chewing tobacco. If you need help quitting, ask your doctor.  Try to lower your stress. If you need help doing this, ask your doctor.  If you are overweight, lose an amount of weight that is healthy for you. Ask your doctor about a safe weight loss  goal. General instructions  Pay attention to any changes in your symptoms.  Take over-the-counter and prescription medicines only as told by your doctor. Do not take aspirin, ibuprofen, or other NSAIDs unless your doctor says it is okay.  Wear loose clothes. Do not wear anything tight around your waist.  Raise (elevate) the head of your bed about 6 inches (15 cm).  Avoid bending over if this makes your symptoms worse.  Keep all follow-up visits as told by your doctor. This is important. Contact a doctor if:  You have new symptoms.  You lose weight and you do not know why.  You have trouble swallowing or it hurts to swallow.  You have wheezing or a cough that keeps happening.  Your symptoms do not get better with treatment.  You have a hoarse voice. Get help right away if:  You have pain in your arms, neck, jaw, teeth, or back.  You feel sweaty, dizzy, or light-headed.  You have chest pain or shortness of breath.  You throw up (vomit) and your throw-up looks like blood or coffee grounds.  You pass out (faint).  Your poop (stool) is bloody or black.  You cannot swallow, drink, or eat. Summary  If a person has gastroesophageal reflux disease (GERD), food and stomach acid move back up into the esophagus  and cause symptoms or problems such as damage to the esophagus.  Treatment will depend on how bad your symptoms are.  Follow a diet as told by your doctor.  Take all medicines only as told by your doctor. This information is not intended to replace advice given to you by your health care provider. Make sure you discuss any questions you have with your health care provider. Document Revised: 09/02/2017 Document Reviewed: 09/02/2017 Elsevier Patient Education  [place].

## 2020-03-13 NOTE — Progress Notes (Signed)
Wyline Mood MD, MRCP(U.K) [address]  [address]  [place], [place] [address]  Main: [phone]  Fax: [phone]   Gastroenterology Consultation  Referring Provider:     Stephanie Acre* Primary Care Physician:  Berniece Pap, FNP Primary Gastroenterologist:  Dr. Wyline Mood  Reason for Consultation:     Abdominal bloating and colon cancer screening.        HPI:   Kristi Carter is a 60 y.o. y/o female who was previously been seen by Dr. Norma Fredrickson in June 2019.  At that time was evaluated for GERD.  She was advised on upper endoscopy.  I do not see any mention of any colonoscopy being ordered.  Dr. Horace Porteous note from 2019 mentions that the patient had an EGD in 2007 which was positive for Barrett's esophagus and negative for dysplasia with multiple biopsies.  01/03/2020: Hemoglobin 14 g, TSH normal, CMP shows mildly elevated transaminases.  She states she is here to transfer care.  She had brought in her endoscopy report from 2019 which I reviewed and the report states that she has Barrett's esophagus from 25 to 33 cm mark.  Multiple biopsies were taken at 4 quadrants.  Also over 2 large pedunculated polyps which were biopsied but not resected.  Unclear why they were not resected.  5 told to her was over 3 cm and correlates with the pictures that have been taken.  She complains of bloating and abdominal distention after meals associated with diarrhea.  Foul-smelling flatulence.  Denies any prior use of any artificial sugars weakness or disorders.  She says she is due for colonoscopy last one was some years back and had no polyps.  Denies any weight loss or other weight gain.  She takes PPI with her meals in the evening as she takes her thyroid pill in the morning.  Is due to have her thyroid levels checked.  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  Past Medical History:  Diagnosis Date  . A-fib (HCC)   . Allergy   . Anxiety   . GERD (gastroesophageal  reflux disease)   . Hypothyroid     Past Surgical History:  Procedure Laterality Date  . AUGMENTATION MAMMAPLASTY Bilateral 1990   saline  . BRAIN SURGERY    . CARDIAC ELECTROPHYSIOLOGY MAPPING AND ABLATION      Prior to Admission medications   Medication Sig Start Date End Date Taking? Authorizing Provider  atorvastatin (LIPITOR) 40 MG tablet  03/22/19   [provider]  cholecalciferol (VITAMIN D3) 25 MCG (1000 UNIT) tablet Take 4,000 Units by mouth daily.    [provider]  clonazePAM (KLONOPIN) 0.5 MG tablet Take 1 tablet (0.5 mg total) by mouth daily as needed. 01/03/20   Flinchum, Eula Fried, FNP  esomeprazole (NEXIUM) 40 MG capsule  03/21/19   [provider]  ibuprofen (ADVIL) 800 MG tablet Take 1 tablet (800 mg total) by mouth daily as needed. 01/03/20   Flinchum, Eula Fried, FNP  levothyroxine (SYNTHROID) 50 MCG tablet Take 50 mcg by mouth every morning. 05/26/19   [provider]  Multiple Vitamins-Minerals (HM MULTIVITAMIN ADULT GUMMY) CHEW Chew by mouth.    [provider]  Omega-3 Fatty Acids (FISH OIL) 1000 MG CAPS Take 1,000 mg by mouth.    [provider]    Family History  Problem Relation Age of Onset  . Healthy Father   . Cancer Paternal Uncle   . Stroke Paternal Grandmother   .  Heart attack Paternal Grandfather      Social History   Tobacco Use  . Smoking status: Never Smoker  . Smokeless tobacco: Never Used  Vaping Use  . Vaping Use: Never used  Substance Use Topics  . Alcohol use: Yes    Alcohol/week: 10.0 standard drinks    Types: 10 Glasses of wine per week  . Drug use: Never    Allergies as of 03/13/2020 - Review Complete 01/24/2020  Allergen Reaction Noted  . Other  07/15/2019  . Prednisone  12/27/2018  . Tape  07/12/2019    Review of Systems:    All systems reviewed and negative except where noted in HPI.   Physical Exam:  There were no vitals taken for this visit. No LMP  recorded. Patient is postmenopausal. Psych:  Alert and cooperative. Normal mood and affect. General:   Alert,  Well-developed, well-nourished, pleasant and cooperative in NAD Head:  Normocephalic and atraumatic. Eyes:  Sclera clear, no icterus.   Conjunctiva pink. Lungs:  Respirations even and unlabored.  Clear throughout to auscultation.   No wheezes, crackles, or rhonchi. No acute distress. Heart:  Regular rate and rhythm; no murmurs, clicks, rubs, or gallops. Abdomen:  Normal bowel sounds.  No bruits.  Soft, non-tender and non-distended without masses, hepatosplenomegaly or hernias noted.  No guarding or rebound tenderness.    Neurologic:  Alert and oriented x3;  grossly normal neurologically. Psych:  Alert and cooperative. Normal mood and affect.  Imaging Studies: No results found.  Assessment and Plan:   Kristi Carter is a 60 y.o. y/o female has been referred for bloating.  Previously seen and evaluated by Dr. Alice Reichert in 2019.  Prior records indicate she had Barrett's esophagus with no dysplasia.  Recent labs in October 2021 demonstrated mild elevated transaminases.  History of gastric polyps which appear large in size and per her description was told of 3 cm but benign.  Unclear why they were not resected.  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  Long segment Barrett's esophagus with no dysplasia.  Plan 1.  Recheck transaminases if elevated will need liver work-up. 2.  Screening colonoscopy 3.  Celiac serology 4.  Low FODMAP diet 5.  Course of Xifaxan for IBS diarrhea 6.  EGD to screen for Barrett's esophagus and resect  large polyp seen in the stomach.  If there is no dysplasia may still require evaluation at [facility] as she is young and has a long segment of Barrett's esophagus and could benefit from radiofrequency ablation  I have discussed alternative options, risks & benefits,  which include, but are not limited to, bleeding, infection, perforation,respiratory  complication & drug reaction.  The patient agrees with this plan & written consent will be obtained.      I have discussed alternative options, risks & benefits,  which include, but are not limited to, bleeding, infection, perforation,respiratory complication & drug reaction.  The patient agrees with this plan & written consent will be obtained.     Follow up in 6 to 8 weeks   Dr [name] MD,MRCP(U.K)

## 2020-03-14 LAB — CELIAC DISEASE AB SCREEN W/RFX
Antigliadin Abs, IgA: 6 units (ref 0–19)
IgA/Immunoglobulin A, Serum: 427 mg/dL — ABNORMAL HIGH (ref 87–352)
Transglutaminase IgA: <2 U/mL (ref 0–3)

## 2020-03-14 LAB — HEPATIC FUNCTION PANEL
ALT: 25 IU/L (ref 0–32)
AST: 43 IU/L — ABNORMAL HIGH (ref 0–40)
Albumin: 4.3 g/dL (ref 3.8–4.9)
Alkaline Phosphatase: 112 IU/L (ref 44–121)
Bilirubin Total: 0.8 mg/dL (ref 0.0–1.2)
Bilirubin, Direct: 0.24 mg/dL (ref 0.00–0.40)
Total Protein: 7.2 g/dL (ref 6.0–8.5)

## 2020-03-15 ENCOUNTER — Encounter: Payer: Self-pay | Admitting: Adult Health

## 2020-03-16 ENCOUNTER — Other Ambulatory Visit: Payer: Self-pay

## 2020-03-27 ENCOUNTER — Other Ambulatory Visit: Payer: Self-pay | Admitting: Adult Health

## 2020-03-27 ENCOUNTER — Telehealth: Payer: Self-pay

## 2020-03-27 MED FILL — ATORVASTATIN 40 MG TABLET: 40 | 90 days supply | Qty: 90 | Fill #0

## 2020-03-27 NOTE — Telephone Encounter (Signed)
Patient called back and was read Dr. Georgeann Oppenheim result findings. Pt had no more questions at this time.

## 2020-03-27 NOTE — Telephone Encounter (Signed)
Called patient to inform her of the lab results. Sent my chart message with Dr. Vicente Males note from the results. LVM to call office with any further questions.

## 2020-04-07 MED FILL — clonazePAM 0.5 MG TABS: 0.5 | 90 days supply | Qty: 90 | Fill #1

## 2020-04-11 ENCOUNTER — Other Ambulatory Visit
Admission: RE | Admit: 2020-04-11 | Discharge: 2020-04-11 | Disposition: A | Discharge status: Home / Self Care | Payer: No Typology Code available for payment source | Source: Ambulatory Visit | Attending: Gastroenterology | Admitting: Gastroenterology

## 2020-04-11 ENCOUNTER — Other Ambulatory Visit: Payer: Self-pay

## 2020-04-11 DIAGNOSIS — Z01812 Encounter for preprocedural laboratory examination: Secondary | ICD-10-CM | POA: Insufficient documentation

## 2020-04-11 DIAGNOSIS — Z20822 Contact with and (suspected) exposure to covid-19: Secondary | ICD-10-CM | POA: Insufficient documentation

## 2020-04-12 ENCOUNTER — Encounter: Payer: Self-pay | Admitting: Gastroenterology

## 2020-04-12 LAB — SARS CORONAVIRUS 2 (TAT 6-24 HRS): SARS Coronavirus 2: NEGATIVE

## 2020-04-13 ENCOUNTER — Ambulatory Visit: Payer: No Typology Code available for payment source | Admitting: Certified Registered Nurse Anesthetist

## 2020-04-13 ENCOUNTER — Ambulatory Visit
Admission: RE | Admit: 2020-04-13 | Discharge: 2020-04-13 | Disposition: A | Discharge status: Home / Self Care | Payer: No Typology Code available for payment source | Attending: Gastroenterology | Admitting: Gastroenterology

## 2020-04-13 ENCOUNTER — Other Ambulatory Visit: Payer: Self-pay

## 2020-04-13 ENCOUNTER — Encounter: Payer: Self-pay | Admitting: Gastroenterology

## 2020-04-13 ENCOUNTER — Encounter
Admission: RE | Disposition: A | Discharge status: Home / Self Care | Payer: Self-pay | Source: Home / Self Care | Attending: Gastroenterology

## 2020-04-13 DIAGNOSIS — Z79899 Other long term (current) drug therapy: Secondary | ICD-10-CM | POA: Diagnosis not present

## 2020-04-13 DIAGNOSIS — Z79890 Hormone replacement therapy (postmenopausal): Secondary | ICD-10-CM | POA: Diagnosis not present

## 2020-04-13 DIAGNOSIS — Z8719 Personal history of other diseases of the digestive system: Secondary | ICD-10-CM | POA: Diagnosis present

## 2020-04-13 DIAGNOSIS — Z1211 Encounter for screening for malignant neoplasm of colon: Secondary | ICD-10-CM | POA: Insufficient documentation

## 2020-04-13 DIAGNOSIS — Z888 Allergy status to other drugs, medicaments and biological substances status: Secondary | ICD-10-CM | POA: Insufficient documentation

## 2020-04-13 DIAGNOSIS — K317 Polyp of stomach and duodenum: Secondary | ICD-10-CM | POA: Insufficient documentation

## 2020-04-13 DIAGNOSIS — K2289 Other specified disease of esophagus: Secondary | ICD-10-CM | POA: Insufficient documentation

## 2020-04-13 HISTORY — PX: COLONOSCOPY WITH PROPOFOL: SHX5780

## 2020-04-13 HISTORY — PX: ESOPHAGOGASTRODUODENOSCOPY (EGD) WITH PROPOFOL: SHX5813

## 2020-04-13 SURGERY — COLONOSCOPY WITH PROPOFOL
Anesthesia: General

## 2020-04-13 MED ORDER — SODIUM CHLORIDE 0.9 % IV SOLN: INTRAVENOUS | Status: DC

## 2020-04-13 MED ORDER — EPINEPHRINE 1 MG/10ML IJ SOSY
PREFILLED_SYRINGE | INTRAMUSCULAR | Status: DC | PRN
  Administered 2020-04-13: 0.1 mg via INTRAVENOUS

## 2020-04-13 MED ORDER — LIDOCAINE HCL (CARDIAC) PF 100 MG/5ML IV SOSY
PREFILLED_SYRINGE | INTRAVENOUS | Status: DC | PRN
  Administered 2020-04-13: 50 mg via INTRAVENOUS

## 2020-04-13 MED ORDER — ONDANSETRON HCL 4 MG/2ML IJ SOLN: 4.0000 mg | Freq: Once | INTRAMUSCULAR | Status: AC

## 2020-04-13 MED ORDER — PROPOFOL 500 MG/50ML IV EMUL
INTRAVENOUS | Status: AC
  Filled 2020-04-13: qty 100

## 2020-04-13 MED ORDER — PROPOFOL 10 MG/ML IV BOLUS
INTRAVENOUS | Status: DC | PRN
  Administered 2020-04-13 (×3): 50 mg via INTRAVENOUS
  Administered 2020-04-13: 100 mg via INTRAVENOUS

## 2020-04-13 MED ORDER — PROPOFOL 500 MG/50ML IV EMUL
INTRAVENOUS | Status: DC | PRN
  Administered 2020-04-13: 150 ug/kg/min via INTRAVENOUS

## 2020-04-13 MED ORDER — ONDANSETRON HCL 4 MG/2ML IJ SOLN
INTRAMUSCULAR | Status: AC
  Administered 2020-04-13: 4 mg via INTRAVENOUS
  Filled 2020-04-13: qty 2

## 2020-04-13 MED ORDER — PROPOFOL 10 MG/ML IV BOLUS
INTRAVENOUS | Status: AC
  Filled 2020-04-13: qty 20

## 2020-04-13 MED FILL — ESOMEPRAZOLE MAG DR 40 MG C: 40 | 90 days supply | Qty: 90 | Fill #3

## 2020-04-13 NOTE — Op Note (Signed)
[facility] Gastroenterology Patient Name: Kristi Carter Procedure Date: 04/13/2020 9:45 AM MRN: [id number] Account #: [id number] Date of Birth: [birth year] Admit Type: Outpatient Age: 60 Room: [facility] ENDO ROOM 4 Gender: Female Note Status: Finalized Procedure:             Upper GI endoscopy Indications:           Follow-up of gastric polyps Providers:             [name] MD, MD Medicines:             Monitored Anesthesia Care Complications:         No immediate complications. Procedure:             Pre-Anesthesia Assessment:                        - Prior to the procedure, a History and Physical was                         performed, and patient medications, allergies and                         sensitivities were reviewed. The patient's tolerance                         of previous anesthesia was reviewed.                        - The risks and benefits of the procedure and the                         sedation options and risks were discussed with the                         patient. All questions were answered and informed                         consent was obtained.                        - ASA Grade Assessment: II - A patient with mild                         systemic disease.                        After obtaining informed consent, the endoscope was                         passed under direct vision. Throughout the procedure,                         the patient's blood pressure, pulse, and oxygen                         saturations were monitored continuously. The Endoscope                         was introduced through the mouth, and advanced to the  third part of duodenum. The upper GI endoscopy was                         accomplished with ease. The patient tolerated the                         procedure well. Findings:      Circumferential salmon-colored mucosa was present. No other visible       abnormalities were present. The  maximum longitudinal extent of these       esophageal mucosal changes was 5 cm in length. biopsies not taken today       as we were resecting large gastric polyps      A single 30 mm pedunculated polyp with no bleeding and no stigmata of       recent bleeding was found on the greater curvature of the stomach. The       polyp was removed with a hot snare. Resection and retrieval were       complete. To prevent bleeding after the polypectomy, three hemostatic       clips were successfully placed. There was no bleeding during, or at the       end, of the procedure.      A single 30 mm semi-sessile polyp with no bleeding and no stigmata of       recent bleeding was found on the greater curvature of the stomach. Area       was [Result] injected with [Volume] saline with epinepohrine for       [Reason]. The polyp was removed with a hot snare. Resection and       retrieval were complete. To prevent bleeding after the polypectomy, two       hemostatic clips were successfully placed. There was no bleeding during,       or at the end, of the procedure.      The examined duodenum was normal. Impression:            - Salmon-colored mucosa suspicious for long-segment                         Barrett's esophagus.                        - A single gastric polyp. Resected and retrieved.                         Clips were placed.                        - A single gastric polyp. Resected and retrieved.                         Injected. Clips were placed.                        - Normal examined duodenum. Recommendation:        - Discharge patient to home (with escort).                        - Resume previous diet.                        -  Repeat upper endoscopy in 3 months for surveillance.                        - also will perform biospies for barrettes in 3 months Procedure Code(s):     --- Professional ---                        [phone], Esophagogastroduodenoscopy, flexible,                          transoral; with removal of tumor(s), polyp(s), or                         other lesion(s) by snare technique Diagnosis Code(s):     --- Professional ---                        K22.8, Other specified diseases of esophagus                        K31.7, Polyp of stomach and duodenum CPT copyright 2019 American Medical Association. All rights reserved. The codes documented in this report are preliminary and upon coder review may  be revised to meet current compliance requirements. [name], MD [name] MD, MD 04/13/2020 10:18:52 AM This report has been signed electronically. Number of Addenda: 0 Note Initiated On: 04/13/2020 9:45 AM Estimated Blood Loss:  Estimated blood loss: none.      [facility]

## 2020-04-13 NOTE — H&P (Signed)
[name], MD [address], [place], [place], [place], [address] [address], [place], [place], [place], [address] Phone: [phone]  Fax: [phone]  Primary Care Physician:  Doreen Beam, FNP   Pre-Procedure History & Physical: HPI:  Kristi Carter is a 60 y.o. female is here for an endoscopy and colonoscopy    Past Medical History:  Diagnosis Date  . A-fib ([place])   . Allergy   . Anxiety   . GERD (gastroesophageal reflux disease)   . Hypothyroid     Past Surgical History:  Procedure Laterality Date  . AUGMENTATION MAMMAPLASTY Bilateral 1990   saline  . BRAIN SURGERY    . CARDIAC ELECTROPHYSIOLOGY MAPPING AND ABLATION      Prior to Admission medications   Medication Sig Start Date End Date Taking? Authorizing Provider  atorvastatin (LIPITOR) 40 MG tablet TAKE 1 TABLET BY MOUTH ONCE DAILY 03/27/20   Flinchum, Kelby Aline, FNP  cholecalciferol (VITAMIN D3) 25 MCG (1000 UNIT) tablet Take 4,000 Units by mouth daily.    [provider]  clonazePAM (KLONOPIN) 0.5 MG tablet Take 1 tablet (0.5 mg total) by mouth daily as needed. 01/03/20   Flinchum, Kelby Aline, FNP  esomeprazole (NEXIUM) 40 MG capsule  03/21/19   [provider]  ibuprofen (ADVIL) 800 MG tablet Take 1 tablet (800 mg total) by mouth daily as needed. 01/03/20   Flinchum, Kelby Aline, FNP  levothyroxine (SYNTHROID) 50 MCG tablet Take 50 mcg by mouth every morning. 05/26/19   [provider]  Multiple Vitamins-Minerals (HM MULTIVITAMIN ADULT GUMMY) CHEW Chew by mouth.    [provider]  Omega-3 Fatty Acids (FISH OIL) 1000 MG CAPS Take 1,000 mg by mouth.    [provider]    Allergies as of 03/13/2020 - Review Complete 03/13/2020  Allergen Reaction Noted  . Other  07/15/2019  . Prednisone  12/27/2018  . Tape  07/12/2019    Family History  Problem Relation Age of Onset  . Healthy Father   . Cancer Paternal Uncle   . Stroke Paternal Grandmother    . Heart attack Paternal Grandfather     Social History   Socioeconomic History  . Marital status: Married    Spouse name: Not on file  . Number of children: Not on file  . Years of education: Not on file  . Highest education level: Not on file  Occupational History  . Not on file  Tobacco Use  . Smoking status: Never Smoker  . Smokeless tobacco: Never Used  Vaping Use  . Vaping Use: Never used  Substance and Sexual Activity  . Alcohol use: Yes    Alcohol/week: 10.0 standard drinks    Types: 10 Glasses of wine per week  . Drug use: Never  . Sexual activity: Yes  Other Topics Concern  . Not on file  Social History Narrative  . Not on file   Social Determinants of Health   Financial Resource Strain: Not on file  Food Insecurity: Not on file  Transportation Needs: Not on file  Physical Activity: Not on file  Stress: Not on file  Social Connections: Not on file  Intimate Partner Violence: Not on file    Review of Systems: See HPI, otherwise negative ROS  Physical Exam: BP 133/88   Pulse 90   Temp (!) 96.8 F (36 C) (Temporal)   Resp 18   Ht 5\' 3"  (1.6 m)   Wt 71.7 kg   SpO2  100%   BMI 27.99 kg/m  General:   Alert,  pleasant and cooperative in NAD Head:  Normocephalic and atraumatic. Neck:  Supple; no masses or thyromegaly. Lungs:  Clear throughout to auscultation, normal respiratory effort.    Heart:  +S1, +S2, Regular rate and rhythm, No edema. Abdomen:  Soft, nontender and nondistended. Normal bowel sounds, without guarding, and without rebound.   Neurologic:  Alert and  oriented x4;  grossly normal neurologically.  Impression/Plan: Kristi Carter is here for an endoscopy and colonoscopy  to be performed for  evaluation of gastric polyp and colon cancer screening     Risks, benefits, limitations, and alternatives regarding endoscopy have been reviewed with the patient.  Questions have been answered.  All parties agreeable.   [name], MD   04/13/2020, 9:37 AM

## 2020-04-13 NOTE — Anesthesia Procedure Notes (Signed)
Date/Time: 04/13/2020 9:44 AM Performed by: Johnna Acosta, CRNA Pre-anesthesia Checklist: Patient identified, Emergency Drugs available, Suction available, Patient being monitored and Timeout performed Patient Re-evaluated:Patient Re-evaluated prior to induction Oxygen Delivery Method: Nasal cannula Preoxygenation: Pre-oxygenation with 100% oxygen Induction Type: IV induction

## 2020-04-13 NOTE — Progress Notes (Signed)
Patient continues to recover without any further complaints of nausea, dizziness, lightheadedness.  IV infusing and per Dr. Vicente Males, he would like her to have 1 liter prior to discharge.  Vital signs are wnl, remains in sinus rhythm.  Spouse updated on current recovery.

## 2020-04-13 NOTE — Transfer of Care (Signed)
Immediate Anesthesia Transfer of Care Note  Patient: ROSCHELLE CALANDRA  Procedure(s) Performed: COLONOSCOPY WITH PROPOFOL (N/A ) ESOPHAGOGASTRODUODENOSCOPY (EGD) WITH PROPOFOL (N/A )  Patient Location: PACU  Anesthesia Type:General  Level of Consciousness: awake, alert  and oriented  Airway & Oxygen Therapy: Patient Spontanous Breathing  Post-op Assessment: Report given to RN and Post -op Vital signs reviewed and stable  Post vital signs: Reviewed and stable  Last Vitals:  Vitals Value Taken Time  BP 109/74 04/13/20 1036  Temp 36.7 C 04/13/20 1034  Pulse 88 04/13/20 1036  Resp 23 04/13/20 1036  SpO2 95 % 04/13/20 1036    Last Pain:  Vitals:   04/13/20 1034  TempSrc: Temporal  PainSc: 0-No pain         Complications: No complications documented.

## 2020-04-13 NOTE — Progress Notes (Signed)
Patient had episode of dizziness and heart rate increase to 163, sinus tachycardia, blood pressure of 119/70, O2 sat 100%.  Dr Vicente Males was at the bedside during this episode discussing procedural findings. Patient supine, head flat.  Patient denies complaints of abdominal or chest pain. Dr Rosey Bath came to bedside and assessed the patient. Heart rate returned to baseline of 70-80's, sinus rhythm.  Patient continued to complain of dizziness, with nausea.  Zofran 4mg  IVP given.

## 2020-04-13 NOTE — Op Note (Signed)
[facility] Patient Name: Kristi Carter Procedure Date: 04/13/2020 9:44 AM MRN: [id number] Account #: [id number] Date of Birth: 09/11/1960 Admit Type: Outpatient Age: 60 Room: [facility] ENDO ROOM 4 Gender: Female Note Status: Finalized Procedure:             Colonoscopy Indications:           Screening for colorectal malignant neoplasm Providers:             [name] MD, MD Referring MD:          No Local Md, MD (Referring MD) Medicines:             Monitored Anesthesia Care Complications:         No immediate complications. Procedure:             Pre-Anesthesia Assessment:                        - Prior to the procedure, a History and Physical was                         performed, and patient medications, allergies and                         sensitivities were reviewed. The patient's tolerance                         of previous anesthesia was reviewed.                        - The risks and benefits of the procedure and the                         sedation options and risks were discussed with the                         patient. All questions were answered and informed                         consent was obtained.                        - ASA Grade Assessment: II - A patient with mild                         systemic disease.                        After obtaining informed consent, the colonoscope was                         passed under direct vision. Throughout the procedure,                         the patient's blood pressure, pulse, and oxygen                         saturations were monitored continuously. The                         Colonoscope was introduced through the anus  and                         advanced to the the cecum, identified by the                         appendiceal orifice. The colonoscopy was performed                         with ease. The patient tolerated the procedure well.                         The quality of the  bowel preparation was excellent. Findings:      The perianal and digital rectal examinations were normal.      The entire examined colon appeared normal on direct and retroflexion       views. Impression:            - The entire examined colon is normal on direct and                         retroflexion views.                        - No specimens collected. Recommendation:        - Discharge patient to home (with escort).                        - Resume previous diet.                        - Continue present medications.                        - Repeat colonoscopy in 10 years for screening                         purposes. Procedure Code(s):     --- Professional ---                        [id number], Colonoscopy, flexible; diagnostic, including                         collection of specimen(s) by brushing or washing, when                         performed (separate procedure) Diagnosis Code(s):     --- Professional ---                        Z12.11, Encounter for screening for malignant neoplasm                         of colon CPT copyright 2019 American Medical Association. All rights reserved. The codes documented in this report are preliminary and upon coder review may  be revised to meet current compliance requirements. [name], MD [name] MD, MD 04/13/2020 10:31:17 AM This report has been signed electronically. Number of Addenda: 0 Note Initiated On: 04/13/2020 9:44 AM Scope Withdrawal Time: 0 hours 6 minutes 57 seconds  Total Procedure Duration: 0 hours 8 minutes 6 seconds  Estimated Blood Loss:  Estimated blood loss: none.      [facility]

## 2020-04-13 NOTE — Anesthesia Preprocedure Evaluation (Signed)
Anesthesia Evaluation  Patient identified by MRN, date of birth, ID band Patient awake    Reviewed: Allergy & Precautions, NPO status , Patient's Chart, lab work & pertinent test results  History of Anesthesia Complications Negative for: history of anesthetic complications  Airway Mallampati: III       Dental   Pulmonary neg sleep apnea, neg COPD, Not current smoker,           Cardiovascular (-) hypertension(-) Past MI and (-) CHF (-) dysrhythmias (-) Valvular Problems/Murmurs     Neuro/Psych neg Seizures Anxiety    GI/Hepatic Neg liver ROS, GERD  Medicated and Controlled,  Endo/Other  neg diabetesHypothyroidism   Renal/GU negative Renal ROS     Musculoskeletal   Abdominal   Peds  Hematology   Anesthesia Other Findings   Reproductive/Obstetrics                             Anesthesia Physical Anesthesia Plan  ASA: III  Anesthesia Plan:    Post-op Pain Management:    Induction:   PONV Risk Score and Plan: 2 and Propofol infusion and TIVA  Airway Management Planned: Nasal Cannula  Additional Equipment:   Intra-op Plan:   Post-operative Plan:   Informed Consent: I have reviewed the patients History and Physical, chart, labs and discussed the procedure including the risks, benefits and alternatives for the proposed anesthesia with the patient or authorized representative who has indicated his/her understanding and acceptance.       Plan Discussed with:   Anesthesia Plan Comments:         Anesthesia Quick Evaluation

## 2020-04-13 NOTE — Anesthesia Postprocedure Evaluation (Signed)
Anesthesia Post Note  Patient: SHELANDA DUVALL  Procedure(s) Performed: COLONOSCOPY WITH PROPOFOL (N/A ) ESOPHAGOGASTRODUODENOSCOPY (EGD) WITH PROPOFOL (N/A )  Patient location during evaluation: Endoscopy Anesthesia Type: General Level of consciousness: awake and alert Pain management: pain level controlled Vital Signs Assessment: post-procedure vital signs reviewed and stable Respiratory status: spontaneous breathing, nonlabored ventilation, respiratory function stable and patient connected to nasal cannula oxygen Cardiovascular status: blood pressure returned to baseline and stable Postop Assessment: no apparent nausea or vomiting Anesthetic complications: no   No complications documented.   Last Vitals:  Vitals:   04/13/20 1114 04/13/20 1124  BP: 123/75 126/71  Pulse: 83 82  Resp: 19 17  Temp:    SpO2: 100% 100%    Last Pain:  Vitals:   04/13/20 1154  TempSrc:   PainSc: 0-No pain                 Martha Clan

## 2020-04-13 NOTE — Progress Notes (Signed)
Patient tolerating ginger ale and saltine crackers without further complaints of nausea.  Blood pressure 123/75, pulse 81 (NSR), O2 100% on RA.  Husband updated and aware of slow recovery time.

## 2020-04-16 ENCOUNTER — Telehealth: Payer: Self-pay

## 2020-04-16 LAB — SURGICAL PATHOLOGY

## 2020-04-16 NOTE — Progress Notes (Signed)
Inform polyps taken out were benign but were the types that needed to come out .

## 2020-04-16 NOTE — Telephone Encounter (Signed)
-----   Message from Kiran Anna, MD sent at 04/16/2020 12:56 PM EST ----- Inform polyps taken out were benign but were the types that needed to come out . 

## 2020-04-16 NOTE — Telephone Encounter (Signed)
Called pt to inform her of results. LVM to call office back.

## 2020-04-17 ENCOUNTER — Telehealth: Payer: Self-pay

## 2020-04-17 NOTE — Telephone Encounter (Signed)
Called and informed patient of results. Pt verbalized understanding.

## 2020-04-17 NOTE — Telephone Encounter (Signed)
-----   Message from [name], MD sent at 04/16/2020 12:56 PM EST ----- Inform polyps taken out were benign but were the types that needed to come out .

## 2020-04-29 ENCOUNTER — Encounter: Payer: Self-pay | Admitting: Adult Health

## 2020-04-30 ENCOUNTER — Other Ambulatory Visit: Payer: Self-pay

## 2020-04-30 ENCOUNTER — Ambulatory Visit (INDEPENDENT_AMBULATORY_CARE_PROVIDER_SITE_OTHER): Payer: No Typology Code available for payment source | Admitting: Gastroenterology

## 2020-04-30 VITALS — BP 102/72 | HR 101 | Temp 98.2°F | Ht 63.0 in | Wt 158.2 lb

## 2020-04-30 DIAGNOSIS — K580 Irritable bowel syndrome with diarrhea: Secondary | ICD-10-CM | POA: Diagnosis not present

## 2020-04-30 DIAGNOSIS — K317 Polyp of stomach and duodenum: Secondary | ICD-10-CM

## 2020-04-30 DIAGNOSIS — R7989 Other specified abnormal findings of blood chemistry: Secondary | ICD-10-CM

## 2020-04-30 DIAGNOSIS — R945 Abnormal results of liver function studies: Secondary | ICD-10-CM

## 2020-04-30 DIAGNOSIS — Z8719 Personal history of other diseases of the digestive system: Secondary | ICD-10-CM | POA: Diagnosis not present

## 2020-04-30 NOTE — Progress Notes (Signed)
[name] MD, MRCP(U.K) [address]  [place]  [place], [place] [address]  Main: [phone]  Fax: [phone]   Primary Care Physician: Doreen Beam, FNP  Primary Gastroenterologist:  Dr. [name]   Follow up of gastric polyps , Barrettes esophagus  HPI: Kristi Carter is a 60 y.o. female    Summary of history :  Transferred care to me in 03/2020. Previously been seen by Dr. Alice Reichert in June 2019.  At that time was evaluated for GERD.   She had brought in her endoscopy report from 2019 which I reviewed and the report states that she has Barrett's esophagus from 25 to 33 cm mark.  Multiple biopsies were taken at 4 quadrants.  Also over 2 large pedunculated polyps which were biopsied but not resected.  Unclear why they were not resected was told it was over  3 cm and correlates with the pictures that have been taken.   01/03/2020: Hemoglobin 14 g, TSH normal, CMP shows mildly elevated transaminases.    Interval history   03/13/2020-04/30/2020  04/13/2020: EGD : Two  30 mm pedunculated polyp was found on the greater curvature of the Stomach.They were resected and were hyperplastic in nature with no dysplasia.  Biopsies for evaluation of Barrett's esophagus and dysplasia were not taken as large polyps were being resected in the stomach.   03/13/2020: Celiac serology : negative  Since her last visit no complaints.  She takes Prilosec 40 mg once a day.  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. Current Outpatient Medications  Medication Sig Dispense Refill  . atorvastatin (LIPITOR) 40 MG tablet TAKE 1 TABLET BY MOUTH ONCE DAILY 90 tablet 3  . cholecalciferol (VITAMIN D3) 25 MCG (1000 UNIT) tablet Take 4,000 Units by mouth daily.    . clonazePAM (KLONOPIN) 0.5 MG tablet Take 1 tablet (0.5 mg total) by mouth daily as needed. 90 tablet 1  . esomeprazole (NEXIUM) 40 MG capsule     . ibuprofen (ADVIL) 800 MG tablet Take 1  tablet (800 mg total) by mouth daily as needed. 90 tablet 0  . levothyroxine (SYNTHROID) 50 MCG tablet Take 50 mcg by mouth every morning.    . Multiple Vitamins-Minerals (HM MULTIVITAMIN ADULT GUMMY) CHEW Chew by mouth.    . Omega-3 Fatty Acids (FISH OIL) 1000 MG CAPS Take 1,000 mg by mouth.     No current facility-administered medications for this visit.    Allergies as of 04/30/2020 - Review Complete 04/30/2020  Allergen Reaction Noted  . Other  07/15/2019  . Prednisone  12/27/2018  . Tape  07/12/2019    ROS:  General: Negative for anorexia, weight loss, fever, chills, fatigue, weakness. ENT: Negative for hoarseness, difficulty swallowing , nasal congestion. CV: Negative for chest pain, angina, palpitations, dyspnea on exertion, peripheral edema.  Respiratory: Negative for dyspnea at rest, dyspnea on exertion, cough, sputum, wheezing.  GI: See history of present illness. GU:  Negative for dysuria, hematuria, urinary incontinence, urinary frequency, nocturnal urination.  Endo: Negative for unusual weight change.    Physical Examination:   BP 102/72   Pulse (!) 101   Temp 98.2 F (36.8 C)   Ht 5\' 3"  (1.6 m)   Wt 158 lb 3.2 oz (71.8 kg)   BMI 28.02 kg/m   General: Well-nourished, well-developed in no acute distress.  Eyes: No icterus. Conjunctivae pink. Neuro: Alert and oriented x 3.  Grossly intact. Skin: Warm and dry, no  jaundice.   Psych: Alert and cooperative, normal mood and affect.   Imaging Studies: No results found.  Assessment and Plan:   Kristi Carter is a 60 y.o. y/o female is here to follow-up since her last visit when she was seen for Barrett's esophagus, gastric polyps and bloating.  EGD demonstrated large polyps in the stomach which were resected and were hyperplastic with no dysplasia.  Long segment of Barrett's esophagus was noted but biopsies were not taken as high-priority was taking the polyps out.  Mild elevated transaminases seen previously are  resolving.    Plan 1.  Recheck transaminases in 3 months. 2.  Low FODMAP diet 3.  I will repeat her endoscopy and take biopsies for evaluation of Barrett's esophagus and dysplasia.  Subsequently I will refer her to be seen by Dr. Lorelee Market at [facility] as she is relatively young and probably may benefit from ablation of a long segment of Barrett's as putting her on long-term therapy with the PPI may also increase her risk for developing further hyperplastic large polyps in the stomach.   I have discussed alternative options, risks & benefits,  which include, but are not limited to, bleeding, infection, perforation,respiratory complication & drug reaction.  The patient agrees with this plan & written consent will be obtained.      Dr [name]  MD,MRCP [facility]) Follow up in 4 months

## 2020-05-01 NOTE — Telephone Encounter (Signed)
1.  Suggest low FODMAP diet 2 diet which avoids all artificial sweeteners, sugars, chewing gum, sweet tea, soda 3 if it persists after that may need a course of antibiotics.Marland Kitchen

## 2020-05-08 ENCOUNTER — Other Ambulatory Visit (HOSPITAL_COMMUNITY): Payer: Self-pay | Admitting: Physician Assistant

## 2020-05-08 MED FILL — metroNIDAZOLE 0.75 % CREA: 0.75 | 30 days supply | Qty: 45 | Fill #0

## 2020-05-24 ENCOUNTER — Ambulatory Visit: Payer: No Typology Code available for payment source | Admitting: Dermatology

## 2020-05-29 ENCOUNTER — Other Ambulatory Visit (HOSPITAL_BASED_OUTPATIENT_CLINIC_OR_DEPARTMENT_OTHER): Payer: Self-pay

## 2020-06-08 ENCOUNTER — Other Ambulatory Visit: Payer: Self-pay | Admitting: Adult Health

## 2020-06-08 NOTE — Telephone Encounter (Signed)
Requested medication (s) are due for refill today:   Provider to review  Requested medication (s) are on the active medication list:   Yes  Future visit scheduled:   No   Last ordered: 01/03/2020 #90, 1 refill  Non delegated refill   Requested Prescriptions  Pending Prescriptions Disp Refills   clonazePAM (KLONOPIN) 0.5 MG tablet [Pharmacy Med Name: clonazePAM 0.5 MG TABS 0.5 Tablet] 90 tablet 1    Sig: TAKE 1 TABLET BY MOUTH ONCE A DAY AS NEEDED      Not Delegated - Psychiatry:  Anxiolytics/Hypnotics Failed - 06/08/2020 12:40 PM      Failed - This refill cannot be delegated      Failed - Urine Drug Screen completed in last 360 days      Passed - Valid encounter within last 6 months    Recent Outpatient Visits           5 months ago Screening for colon cancer   [place], FNP   9 months ago Midline thoracic back pain, unspecified chronicity   [facility] Fl[place]um, Kelby Aline, FNP   10 months ago Routine health maintenance   [facility] Flinchum, Kelby Aline, [place]

## 2020-06-11 ENCOUNTER — Other Ambulatory Visit: Payer: Self-pay

## 2020-06-11 ENCOUNTER — Other Ambulatory Visit
Admission: RE | Admit: 2020-06-11 | Discharge: 2020-06-11 | Disposition: A | Discharge status: Home / Self Care | Payer: No Typology Code available for payment source | Source: Ambulatory Visit | Attending: Gastroenterology | Admitting: Gastroenterology

## 2020-06-11 DIAGNOSIS — Z20822 Contact with and (suspected) exposure to covid-19: Secondary | ICD-10-CM | POA: Diagnosis not present

## 2020-06-11 DIAGNOSIS — Z01812 Encounter for preprocedural laboratory examination: Secondary | ICD-10-CM | POA: Diagnosis present

## 2020-06-12 ENCOUNTER — Encounter: Payer: Self-pay | Admitting: Certified Registered"

## 2020-06-12 ENCOUNTER — Encounter: Payer: Self-pay | Admitting: Gastroenterology

## 2020-06-12 LAB — SARS CORONAVIRUS 2 (TAT 6-24 HRS): SARS Coronavirus 2: NEGATIVE

## 2020-06-13 ENCOUNTER — Telehealth: Payer: Self-pay | Admitting: Gastroenterology

## 2020-06-13 ENCOUNTER — Ambulatory Visit
Admission: RE | Admit: 2020-06-13 | Payer: No Typology Code available for payment source | Source: Home / Self Care | Admitting: Gastroenterology

## 2020-06-13 ENCOUNTER — Encounter: Admission: RE | Payer: Self-pay | Source: Home / Self Care

## 2020-06-13 SURGERY — ESOPHAGOGASTRODUODENOSCOPY (EGD) WITH PROPOFOL
Anesthesia: General

## 2020-06-13 NOTE — Telephone Encounter (Signed)
This is a repeat EGD. In the last EGD was a 3 mth repeat , procedure needs to be r/s after 07/11/20 as the initial one was done 04/13/20. Insurance will not cover.   Patient understands and ok with r/s her EGD.

## 2020-06-14 ENCOUNTER — Other Ambulatory Visit: Payer: Self-pay

## 2020-06-14 ENCOUNTER — Other Ambulatory Visit (HOSPITAL_COMMUNITY): Payer: Self-pay

## 2020-06-14 ENCOUNTER — Telehealth: Payer: Self-pay

## 2020-06-14 DIAGNOSIS — K580 Irritable bowel syndrome with diarrhea: Secondary | ICD-10-CM

## 2020-06-14 DIAGNOSIS — Z8719 Personal history of other diseases of the digestive system: Secondary | ICD-10-CM

## 2020-06-14 MED ORDER — DICYCLOMINE HCL 10 MG PO CAPS
10.0000 mg | ORAL_CAPSULE | Freq: Three times a day (TID) | ORAL | 3 refills | Status: DC | PRN
  Filled 2020-06-14: qty 90, 30d supply, fill #0

## 2020-06-14 NOTE — Telephone Encounter (Signed)
-----   Message from [name], MD sent at 06/14/2020  7:51 AM EDT ----- Regarding: RE: Stomach pain 1. Has the diarrhea stopped ? If not get stool tests for C diff and GI pcr 2. For bloating try bentyl 10 mg PRN 18 hr for pain .  3. Trial of activated charcoal tablets PRN.  4. Do we know why the EGD yesterday was cancelled?   ----- Message ----- From: Storm Frisk, CMA Sent: 06/12/2020   4:42 PM EDT To: [name], MD Subject: Stomach pain                                   Hi Dr. Vicente Males,  Received my chart message from patient having issues with her stomach. See message below.   Per patient: I also need the dr to see about getting me some meds for my stomach.  I have tried the diet I am so bloated still and hurt alot. Diarrhea and got sick 2 days ago. Throwing up from food. I had a salad and broccoli soup. I know I cant eat broccoli now. I really need help.    Please Advise

## 2020-06-14 NOTE — Telephone Encounter (Signed)
Informed patient Dr. Vicente Males would like to do a stool test. He would like for her to start Bentyl and activated charcoal. Will send Rx in later today. Pt verbalized understanding.

## 2020-06-14 NOTE — Telephone Encounter (Signed)
error 

## 2020-06-15 ENCOUNTER — Other Ambulatory Visit (HOSPITAL_COMMUNITY): Payer: Self-pay

## 2020-06-26 ENCOUNTER — Other Ambulatory Visit: Payer: Self-pay | Admitting: Physician Assistant

## 2020-06-26 ENCOUNTER — Other Ambulatory Visit: Payer: Self-pay | Admitting: Family Medicine

## 2020-06-26 ENCOUNTER — Other Ambulatory Visit (HOSPITAL_COMMUNITY): Payer: Self-pay

## 2020-06-26 ENCOUNTER — Other Ambulatory Visit: Payer: Self-pay | Admitting: Adult Health

## 2020-06-26 MED FILL — Atorvastatin Calcium Tab 40 MG (Base Equivalent): ORAL | 90 days supply | Qty: 90 | Fill #0 | Status: AC

## 2020-06-27 ENCOUNTER — Other Ambulatory Visit (HOSPITAL_COMMUNITY): Payer: Self-pay

## 2020-06-27 LAB — GI PROFILE, STOOL, PCR

## 2020-06-27 MED ORDER — LEVOTHYROXINE SODIUM 50 MCG PO TABS
50.0000 ug | ORAL_TABLET | Freq: Every morning | ORAL | 0 refills | Status: DC
  Filled 2020-06-27: qty 30, 30d supply, fill #0

## 2020-06-28 ENCOUNTER — Other Ambulatory Visit (HOSPITAL_COMMUNITY): Payer: Self-pay

## 2020-06-28 ENCOUNTER — Encounter: Payer: Self-pay | Admitting: Gastroenterology

## 2020-06-28 MED FILL — Atorvastatin Calcium Tab 40 MG (Base Equivalent): ORAL | 90 days supply | Qty: 90 | Fill #1 | Status: CN

## 2020-07-09 ENCOUNTER — Encounter: Payer: Self-pay | Admitting: Adult Health

## 2020-07-10 ENCOUNTER — Other Ambulatory Visit: Payer: Self-pay | Admitting: Adult Health

## 2020-07-10 ENCOUNTER — Telehealth: Payer: Self-pay

## 2020-07-10 DIAGNOSIS — E039 Hypothyroidism, unspecified: Secondary | ICD-10-CM

## 2020-07-10 NOTE — Telephone Encounter (Signed)
[employer] with [employer] called and requested pt's insurance card and most recent labs to be faxed to [phone]. Please advise

## 2020-07-10 NOTE — Telephone Encounter (Signed)
Pts insurance card and Most recent labs have been faxed to [facility].

## 2020-07-12 ENCOUNTER — Other Ambulatory Visit (HOSPITAL_COMMUNITY): Payer: Self-pay

## 2020-07-12 MED ORDER — ESOMEPRAZOLE MAGNESIUM 40 MG PO CPDR
40.0000 mg | DELAYED_RELEASE_CAPSULE | Freq: Every day | ORAL | 3 refills | Status: DC
  Filled 2020-07-12: qty 90, 90d supply, fill #0
  Filled 2020-09-30: qty 90, 90d supply, fill #1
  Filled 2021-01-16: qty 90, 90d supply, fill #2
  Filled 2021-04-18: qty 90, 90d supply, fill #3

## 2020-07-16 ENCOUNTER — Ambulatory Visit
Admission: RE | Admit: 2020-07-16 | Discharge: 2020-07-16 | Disposition: A | Discharge status: Home / Self Care | Payer: No Typology Code available for payment source | Source: Ambulatory Visit | Attending: Gastroenterology | Admitting: Gastroenterology

## 2020-07-16 ENCOUNTER — Ambulatory Visit: Payer: No Typology Code available for payment source | Admitting: Registered Nurse

## 2020-07-16 ENCOUNTER — Other Ambulatory Visit: Payer: Self-pay

## 2020-07-16 ENCOUNTER — Encounter
Admission: RE | Disposition: A | Discharge status: Home / Self Care | Payer: Self-pay | Source: Ambulatory Visit | Attending: Gastroenterology

## 2020-07-16 DIAGNOSIS — K2270 Barrett's esophagus without dysplasia: Secondary | ICD-10-CM | POA: Diagnosis not present

## 2020-07-16 DIAGNOSIS — Z79890 Hormone replacement therapy (postmenopausal): Secondary | ICD-10-CM | POA: Insufficient documentation

## 2020-07-16 DIAGNOSIS — Z13810 Encounter for screening for upper gastrointestinal disorder: Secondary | ICD-10-CM | POA: Insufficient documentation

## 2020-07-16 DIAGNOSIS — Z79899 Other long term (current) drug therapy: Secondary | ICD-10-CM | POA: Insufficient documentation

## 2020-07-16 DIAGNOSIS — Z888 Allergy status to other drugs, medicaments and biological substances status: Secondary | ICD-10-CM | POA: Insufficient documentation

## 2020-07-16 DIAGNOSIS — K317 Polyp of stomach and duodenum: Secondary | ICD-10-CM | POA: Diagnosis not present

## 2020-07-16 DIAGNOSIS — Z8719 Personal history of other diseases of the digestive system: Secondary | ICD-10-CM | POA: Diagnosis not present

## 2020-07-16 HISTORY — PX: ESOPHAGOGASTRODUODENOSCOPY (EGD) WITH PROPOFOL: SHX5813

## 2020-07-16 SURGERY — ESOPHAGOGASTRODUODENOSCOPY (EGD) WITH PROPOFOL
Anesthesia: General

## 2020-07-16 MED ORDER — LIDOCAINE HCL (PF) 2 % IJ SOLN
INTRAMUSCULAR | Status: AC
  Filled 2020-07-16: qty 2

## 2020-07-16 MED ORDER — LIDOCAINE HCL (CARDIAC) PF 100 MG/5ML IV SOSY
PREFILLED_SYRINGE | INTRAVENOUS | Status: DC | PRN
  Administered 2020-07-16: 60 mg via INTRAVENOUS

## 2020-07-16 MED ORDER — GLYCOPYRROLATE 0.2 MG/ML IJ SOLN
INTRAMUSCULAR | Status: DC | PRN
  Administered 2020-07-16: .2 mg via INTRAVENOUS

## 2020-07-16 MED ORDER — PROPOFOL 10 MG/ML IV BOLUS
INTRAVENOUS | Status: DC | PRN
  Administered 2020-07-16: 30 mg via INTRAVENOUS
  Administered 2020-07-16: 70 mg via INTRAVENOUS
  Administered 2020-07-16: 40 mg via INTRAVENOUS
  Administered 2020-07-16: 10 mg via INTRAVENOUS
  Administered 2020-07-16: 50 mg via INTRAVENOUS
  Administered 2020-07-16 (×5): 20 mg via INTRAVENOUS

## 2020-07-16 MED ORDER — GLYCOPYRROLATE 0.2 MG/ML IJ SOLN
INTRAMUSCULAR | Status: AC
  Filled 2020-07-16: qty 1

## 2020-07-16 MED ORDER — SODIUM CHLORIDE 0.9 % IV SOLN
INTRAVENOUS | Status: DC
  Administered 2020-07-16: 20 mL/h via INTRAVENOUS

## 2020-07-16 MED ORDER — PROPOFOL 500 MG/50ML IV EMUL
INTRAVENOUS | Status: AC
  Filled 2020-07-16: qty 150

## 2020-07-16 NOTE — Op Note (Signed)
[facility] Gastroenterology Patient Name: Kristi Carter Procedure Date: 07/16/2020 6:54 AM MRN: [id number] Account #: [id number] Date of Birth: 1960/05/25 Admit Type: Outpatient Age: 60 Room: [facility] ENDO ROOM 4 Gender: Female Note Status: Finalized Procedure:             Upper GI endoscopy Indications:           Suspected Barrett's esophagus Providers:             [name] MD, MD Referring MD:          Kelby Aline. Flinchum (Referring MD) Medicines:             Monitored Anesthesia Care Complications:         No immediate complications. Procedure:             Pre-Anesthesia Assessment:                        - Prior to the procedure, a History and Physical was                         performed, and patient medications, allergies and                         sensitivities were reviewed. The patient's tolerance                         of previous anesthesia was reviewed.                        - The risks and benefits of the procedure and the                         sedation options and risks were discussed with the                         patient. All questions were answered and informed                         consent was obtained.                        - ASA Grade Assessment: II - A patient with mild                         systemic disease.                        After obtaining informed consent, the endoscope was                         passed under direct vision. Throughout the procedure,                         the patient's blood pressure, pulse, and oxygen                         saturations were monitored continuously. The Endoscope                         was introduced through the mouth, and  advanced to the                         third part of duodenum. The upper GI endoscopy was                         accomplished with ease. The patient tolerated the                         procedure well. Findings:      The esophagus and gastroesophageal junction  were examined with white       light. There were esophageal mucosal changes suspicious for long-segment       Barrett's esophagus. These changes involved the mucosa at the upper       extent of the gastric folds (27 cm from the incisors) extending to the       Z-line (27 cm from the incisors). Circumferential salmon-colored mucosa       was present from 27 to 35 cm and no visible abnormalities were present.       The maximum longitudinal extent of these esophageal mucosal changes was       8 cm in length. Mucosa was biopsied with a cold forceps for histology. A       total of 4 specimen bottles were sent to pathology.      Two 8 to 15 mm sessile polyps with no bleeding and no stigmata of recent       bleeding were found on the greater curvature of the stomach. The polyp       was removed with a hot snare. Resection and retrieval were complete. To       prevent bleeding after the polypectomy, one hemostatic clip was       successfully placed. There was no bleeding during, or at the end, of the       procedure.      Old clips seen from prior polypectomy      The cardia and gastric fundus were normal on retroflexion.      The examined duodenum was normal. Impression:            - Esophageal mucosal changes suspicious for                         long-segment Barrett's esophagus. Biopsied.                        - Two gastric polyps. Resected and retrieved. Clip was                         placed.                        - Normal examined duodenum. Recommendation:        - Discharge patient to home (with escort).                        - Resume previous diet.                        - Continue present medications.                        - Await  pathology results.                        - Return to my office as previously scheduled. Procedure Code(s):     --- Professional ---                        [phone], Esophagogastroduodenoscopy, flexible,                         transoral; with removal of  tumor(s), polyp(s), or                         other lesion(s) by snare technique                        43239, 45, Esophagogastroduodenoscopy, flexible,                         transoral; with biopsy, single or multiple CPT copyright 2019 American Medical Association. All rights reserved. The codes documented in this report are preliminary and upon coder review may  be revised to meet current compliance requirements. [name], MD [name] MD, MD 07/16/2020 8:09:22 AM This report has been signed electronically. Number of Addenda: 0 Note Initiated On: 07/16/2020 6:54 AM Estimated Blood Loss:  Estimated blood loss: none.      [facility]

## 2020-07-16 NOTE — H&P (Signed)
[name], MD [address], [place], [place], [place], [address] [address], [place], [place], [place], [address] Phone: [phone]  Fax: [phone]  Primary Care Physician:  Doreen Beam, FNP   Pre-Procedure History & Physical: HPI:  Kristi Carter is a 60 y.o. female is here for an endoscopy    Past Medical History:  Diagnosis Date  . A-fib ([place])   . Allergy   . Anxiety   . GERD (gastroesophageal reflux disease)   . Hypothyroid     Past Surgical History:  Procedure Laterality Date  . AUGMENTATION MAMMAPLASTY Bilateral 1990   saline  . BRAIN SURGERY    . CARDIAC ELECTROPHYSIOLOGY MAPPING AND ABLATION    . COLONOSCOPY WITH PROPOFOL N/A 04/13/2020   Procedure: COLONOSCOPY WITH PROPOFOL;  Surgeon: [name], MD;  Location: [facility] ENDOSCOPY;  Service: Gastroenterology;  Laterality: N/A;  . ESOPHAGOGASTRODUODENOSCOPY (EGD) WITH PROPOFOL N/A 04/13/2020   Procedure: ESOPHAGOGASTRODUODENOSCOPY (EGD) WITH PROPOFOL;  Surgeon: [name], MD;  Location: [facility] ENDOSCOPY;  Service: Gastroenterology;  Laterality: N/A;    Prior to Admission medications   Medication Sig Start Date End Date Taking? Authorizing Provider  cholecalciferol (VITAMIN D3) 25 MCG (1000 UNIT) tablet Take 4,000 Units by mouth daily.   Yes [provider]  dicyclomine (BENTYL) 10 MG capsule Take 1 capsule (10 mg total) by mouth 3 (three) times daily between meals as needed for spasms. 06/14/20  Yes [name], MD  esomeprazole ([place]) 40 MG capsule  03/21/19  Yes [provider]  esomeprazole (NEXIUM) 40 MG capsule TAKE 1 CAPSULE BY MOUTH ONCE A DAY 07/12/20  Yes   fluconazole (DIFLUCAN) 100 MG tablet TAKE 2 TABLETS BY MOUTH AS INSTRUCTED 03/16/20 03/16/21 Yes   ibuprofen (ADVIL) 800 MG tablet TAKE 1 TABLET BY MOUTH ONCE A DAY AS NEEDED 01/03/20 01/02/21 Yes Flinchum, Kelby Aline, FNP  levothyroxine (SYNTHROID) 50 MCG tablet Take 50 mcg by mouth every morning. 05/26/19  Yes [provider]  levothyroxine (SYNTHROID) 50 MCG tablet Take 1 tablet (50 mcg total) by mouth every morning on an empty stomach 06/27/20  Yes   metroNIDAZOLE (METROCREAM) 0.75 % cream APPLY THIN LAYER TO [facility] MORNING 05/08/20 05/08/21 Yes Gloris Manchester B, [place]  Multiple Vitamins-Minerals (HM MULTIVITAMIN ADULT GUMMY) CHEW Chew by mouth.   Yes [provider]  Na Sulfate-K Sulfate-Mg Sulf 17.5-3.13-1.6 GM/177ML SOLN TAKE 1 KIT BY MOUTH AS DIRECTED. 01/11/20 01/10/21 Yes [name], MD  Omega-3 Fatty Acids (FISH OIL) 1000 MG CAPS Take 1,000 mg by mouth.   Yes [provider]  amoxicillin (AMOXIL) 500 MG capsule TAKE 2 CAPSULES BY MOUTH NOW, THEN TAKE 1 CAPSULE BY MOUTH 3 TIMES A DAY UNTIL FINISHED 03/16/20 03/16/21    atorvastatin (LIPITOR) 40 MG tablet TAKE 1 TABLET BY MOUTH ONCE DAILY Patient not taking: Reported on 07/16/2020 03/27/20 03/27/21  Flinchum, Kelby Aline, FNP  esomeprazole (NEXIUM) 40 MG capsule TAKE 1 CAPSULE BY MOUTH ONCE A DAY 06/20/19 06/19/20  Jacelyn Pi, MD    Allergies as of 06/15/2020 - Review Complete 06/12/2020  Allergen Reaction Noted  . Other  07/15/2019  . Prednisone  12/27/2018  . Tape  07/12/2019    Family History  Problem Relation Age of Onset  . Healthy Father   . Cancer Paternal Uncle   . Stroke Paternal Grandmother   . Heart attack Paternal Grandfather     Social History   Socioeconomic History  . Marital status: Married  Spouse name: Not on file  . Number of children: Not on file  . Years of education: Not on file  . Highest education level: Not on file  Occupational History  . Not on file  Tobacco Use  . Smoking status: Never Smoker  . Smokeless tobacco: Never Used  Vaping Use  . Vaping Use: Never used  Substance and Sexual Activity  . Alcohol use: Yes    Alcohol/week: 10.0 standard drinks    Types: 10 Glasses of wine per week  . Drug use: Never  . Sexual activity: Yes  Other Topics Concern  . Not on file  Social  History Narrative  . Not on file   Social Determinants of Health   Financial Resource Strain: Not on file  Food Insecurity: Not on file  Transportation Needs: Not on file  Physical Activity: Not on file  Stress: Not on file  Social Connections: Not on file  Intimate Partner Violence: Not on file    Review of Systems: See HPI, otherwise negative ROS  Physical Exam: BP 119/80   Pulse 79   Temp (!) 96.5 F (35.8 C) (Temporal)   Resp 20   Ht '5\' 3"'  (1.6 m)   Wt 71.7 kg   SpO2 100%   BMI 27.99 kg/m  General:   Alert,  pleasant and cooperative in NAD Head:  Normocephalic and atraumatic. Neck:  Supple; no masses or thyromegaly. Lungs:  Clear throughout to auscultation, normal respiratory effort.    Heart:  +S1, +S2, Regular rate and rhythm, No edema. Abdomen:  Soft, nontender and nondistended. Normal bowel sounds, without guarding, and without rebound.   Neurologic:  Alert and  oriented x4;  grossly normal neurologically.  Impression/Plan: Kristi Carter is here for an endoscopy  to be performed for  evaluation of barrettes esophagus    Risks, benefits, limitations, and alternatives regarding endoscopy have been reviewed with the patient.  Questions have been answered.  All parties agreeable.   [name], MD  07/16/2020, 7:45 AM

## 2020-07-16 NOTE — Anesthesia Preprocedure Evaluation (Signed)
Anesthesia Evaluation  Patient identified by MRN, date of birth, ID band Patient awake    Reviewed: Allergy & Precautions, NPO status , Patient's Chart, lab work & pertinent test results  History of Anesthesia Complications Negative for: history of anesthetic complications  Airway Mallampati: III  TM Distance: >3 FB Neck ROM: Full    Dental no notable dental hx.    Pulmonary neg pulmonary ROS, neg sleep apnea, neg COPD, Not current smoker,    Pulmonary exam normal        Cardiovascular (-) hypertension(-) Past MI and (-) CHF negative cardio ROS Normal cardiovascular exam(-) dysrhythmias (-) Valvular Problems/Murmurs     Neuro/Psych neg Seizures PSYCHIATRIC DISORDERS Anxiety negative neurological ROS     GI/Hepatic Neg liver ROS, GERD  Medicated and Controlled,  Endo/Other  neg diabetesHypothyroidism   Renal/GU negative Renal ROS     Musculoskeletal   Abdominal Normal abdominal exam  (+)   Peds  Hematology  (+) Blood dyscrasia, ,   Anesthesia Other Findings   Reproductive/Obstetrics                            Anesthesia Physical  Anesthesia Plan  ASA: III  Anesthesia Plan: General   Post-op Pain Management:    Induction: Intravenous  PONV Risk Score and Plan: 2 and Propofol infusion and TIVA  Airway Management Planned: Nasal Cannula  Additional Equipment:   Intra-op Plan:   Post-operative Plan:   Informed Consent: I have reviewed the patients History and Physical, chart, labs and discussed the procedure including the risks, benefits and alternatives for the proposed anesthesia with the patient or authorized representative who has indicated his/her understanding and acceptance.       Plan Discussed with: CRNA, Anesthesiologist and Surgeon  Anesthesia Plan Comments:        Anesthesia Quick Evaluation

## 2020-07-16 NOTE — Transfer of Care (Signed)
Immediate Anesthesia Transfer of Care Note  Patient: Kristi Carter  Procedure(s) Performed: ESOPHAGOGASTRODUODENOSCOPY (EGD) WITH PROPOFOL (N/A )  Patient Location: PACU  Anesthesia Type:General  Level of Consciousness: awake, alert  and oriented  Airway & Oxygen Therapy: Patient Spontanous Breathing  Post-op Assessment: Report given to RN and Post -op Vital signs reviewed and stable  Post vital signs: Reviewed and stable  Last Vitals:  Vitals Value Taken Time  BP 80/45 07/16/20 0810  Temp 35.8 C 07/16/20 0810  Pulse 99 07/16/20 0811  Resp 21 07/16/20 0811  SpO2 95 % 07/16/20 0811  Vitals shown include unvalidated device data.  Last Pain:  Vitals:   07/16/20 0810  TempSrc: Tympanic  PainSc: Asleep         Complications: No complications documented.

## 2020-07-16 NOTE — Anesthesia Postprocedure Evaluation (Signed)
Anesthesia Post Note  Patient: Kristi Carter  Procedure(s) Performed: ESOPHAGOGASTRODUODENOSCOPY (EGD) WITH PROPOFOL (N/A )  Patient location during evaluation: Phase II Anesthesia Type: General Level of consciousness: awake and alert, awake and oriented Pain management: pain level controlled Vital Signs Assessment: post-procedure vital signs reviewed and stable Respiratory status: spontaneous breathing, nonlabored ventilation and respiratory function stable Cardiovascular status: blood pressure returned to baseline and stable Postop Assessment: no apparent nausea or vomiting Anesthetic complications: no   No complications documented.   Last Vitals:  Vitals:   07/16/20 0840 07/16/20 0850  BP: 135/83 134/64  Pulse: 77 89  Resp: 12 14  Temp:    SpO2: 98% 98%    Last Pain:  Vitals:   07/16/20 0850  TempSrc:   PainSc: 0-No pain                 Phill Mutter

## 2020-07-17 ENCOUNTER — Encounter: Payer: Self-pay | Admitting: Gastroenterology

## 2020-07-17 ENCOUNTER — Other Ambulatory Visit (HOSPITAL_COMMUNITY): Payer: Self-pay

## 2020-07-17 LAB — SURGICAL PATHOLOGY

## 2020-07-18 NOTE — Progress Notes (Signed)
Finding: Barrett's esophagus, gastric polyps  Gastroenterology should call you with results and advise accordingly.

## 2020-07-19 ENCOUNTER — Other Ambulatory Visit: Payer: Self-pay | Admitting: Adult Health

## 2020-07-19 ENCOUNTER — Other Ambulatory Visit (HOSPITAL_COMMUNITY): Payer: Self-pay

## 2020-07-19 MED ORDER — CLONAZEPAM 0.5 MG PO TABS
0.5000 mg | ORAL_TABLET | Freq: Every day | ORAL | 1 refills | Status: DC | PRN
  Filled 2020-07-19: qty 90, 90d supply, fill #0
  Filled 2020-11-30: qty 90, 90d supply, fill #1

## 2020-08-13 ENCOUNTER — Other Ambulatory Visit (HOSPITAL_COMMUNITY): Payer: Self-pay

## 2020-08-13 MED ORDER — LEVOTHYROXINE SODIUM 50 MCG PO TABS
ORAL_TABLET | ORAL | 4 refills | Status: DC
  Filled 2020-08-13: qty 90, 90d supply, fill #0
  Filled 2020-11-10: qty 90, 90d supply, fill #1
  Filled 2021-02-18: qty 90, 90d supply, fill #2
  Filled 2021-04-30: qty 90, 90d supply, fill #3
  Filled 2021-07-29: qty 90, 90d supply, fill #4

## 2020-08-21 ENCOUNTER — Inpatient Hospital Stay: Payer: No Typology Code available for payment source | Attending: Oncology

## 2020-08-21 DIAGNOSIS — D472 Monoclonal gammopathy: Secondary | ICD-10-CM | POA: Insufficient documentation

## 2020-08-21 LAB — CBC WITH DIFFERENTIAL/PLATELET
Abs Immature Granulocytes: 0.03 10*3/uL (ref 0.00–0.07)
Basophils Absolute: 0.1 10*3/uL (ref 0.0–0.1)
Basophils Relative: 1 %
Eosinophils Absolute: 0.1 10*3/uL (ref 0.0–0.5)
Eosinophils Relative: 2 %
HCT: 43.8 % (ref 36.0–46.0)
Hemoglobin: 15.1 g/dL — ABNORMAL HIGH (ref 12.0–15.0)
Immature Granulocytes: 1 %
Lymphocytes Relative: 23 %
Lymphs Abs: 1.5 10*3/uL (ref 0.7–4.0)
MCH: 34.8 pg — ABNORMAL HIGH (ref 26.0–34.0)
MCHC: 34.5 g/dL (ref 30.0–36.0)
MCV: 100.9 fL — ABNORMAL HIGH (ref 80.0–100.0)
Monocytes Absolute: 0.6 10*3/uL (ref 0.1–1.0)
Monocytes Relative: 8 %
Neutro Abs: 4.3 10*3/uL (ref 1.7–7.7)
Neutrophils Relative %: 65 %
Platelets: 289 10*3/uL (ref 150–400)
RBC: 4.34 MIL/uL (ref 3.87–5.11)
RDW: 12.1 % (ref 11.5–15.5)
WBC: 6.6 10*3/uL (ref 4.0–10.5)
nRBC: 0 % (ref 0.0–0.2)

## 2020-08-21 LAB — COMPREHENSIVE METABOLIC PANEL
ALT: 35 U/L (ref 0–44)
AST: 77 U/L — ABNORMAL HIGH (ref 15–41)
Albumin: 4 g/dL (ref 3.5–5.0)
Alkaline Phosphatase: 99 U/L (ref 38–126)
Anion gap: 14 (ref 5–15)
BUN: 8 mg/dL (ref 6–20)
CO2: 24 mmol/L (ref 22–32)
Calcium: 8.9 mg/dL (ref 8.9–10.3)
Chloride: 100 mmol/L (ref 98–111)
Creatinine, Ser: 0.59 mg/dL (ref 0.44–1.00)
GFR, Estimated: >60 mL/min (ref >60)
Glucose, Bld: 105 mg/dL — ABNORMAL HIGH (ref 70–99)
Potassium: 3.7 mmol/L (ref 3.5–5.1)
Sodium: 138 mmol/L (ref 135–145)
Total Bilirubin: 0.9 mg/dL (ref 0.3–1.2)
Total Protein: 7.7 g/dL (ref 6.5–8.1)

## 2020-08-22 LAB — KAPPA/LAMBDA LIGHT CHAINS
Kappa free light chain: 17.4 mg/L (ref 3.3–19.4)
Kappa, lambda light chain ratio: 0.82 (ref 0.26–1.65)
Lambda free light chains: 21.1 mg/L (ref 5.7–26.3)

## 2020-08-23 ENCOUNTER — Encounter: Payer: No Typology Code available for payment source | Admitting: Adult Health

## 2020-08-24 LAB — MULTIPLE MYELOMA PANEL, SERUM
Albumin SerPl Elph-Mcnc: 3.8 g/dL (ref 2.9–4.4)
Albumin/Glob SerPl: 1.2 (ref 0.7–1.7)
Alpha 1: 0.2 g/dL (ref 0.0–0.4)
Alpha2 Glob SerPl Elph-Mcnc: 0.8 g/dL (ref 0.4–1.0)
B-Globulin SerPl Elph-Mcnc: 1.3 g/dL (ref 0.7–1.3)
Gamma Glob SerPl Elph-Mcnc: 0.9 g/dL (ref 0.4–1.8)
Globulin, Total: 3.3 g/dL (ref 2.2–3.9)
IgA: 422 mg/dL — ABNORMAL HIGH (ref 87–352)
IgG (Immunoglobin G), Serum: 958 mg/dL (ref 586–1602)
IgM (Immunoglobulin M), Srm: 107 mg/dL (ref 26–217)
Total Protein ELP: 7.1 g/dL (ref 6.0–8.5)

## 2020-08-25 ENCOUNTER — Encounter: Payer: Self-pay | Admitting: Oncology

## 2020-08-27 ENCOUNTER — Ambulatory Visit: Payer: No Typology Code available for payment source | Admitting: Dermatology

## 2020-08-27 ENCOUNTER — Other Ambulatory Visit: Payer: Self-pay

## 2020-08-27 ENCOUNTER — Encounter: Payer: No Typology Code available for payment source | Admitting: Adult Health

## 2020-08-28 ENCOUNTER — Ambulatory Visit: Payer: No Typology Code available for payment source | Admitting: Oncology

## 2020-08-28 ENCOUNTER — Encounter: Payer: Self-pay | Admitting: Oncology

## 2020-08-28 ENCOUNTER — Inpatient Hospital Stay (HOSPITAL_BASED_OUTPATIENT_CLINIC_OR_DEPARTMENT_OTHER): Payer: No Typology Code available for payment source | Admitting: Oncology

## 2020-08-28 DIAGNOSIS — D472 Monoclonal gammopathy: Secondary | ICD-10-CM

## 2020-08-28 NOTE — Progress Notes (Signed)
I connected with Kristi Carter on 08/28/20 at 11:15 AM EDT by video enabled telemedicine visit and verified that I am speaking with the correct person using two identifiers.   I discussed the limitations, risks, security and privacy concerns of performing an evaluation and management service by telemedicine and the availability of in-person appointments. I also discussed with the patient that there may be a patient responsible charge related to this service. The patient expressed understanding and agreed to proceed.  Other persons participating in the visit and their role in the encounter:  none  Patient's location:  home Provider's location:  work  Risk analyst Complaint: Routine follow-up of MGUS  History of present illness: Patient is a 60 year old female with a past medical history significant for hypothyroidism, hyperlipidemia and anxiety.  She has been referred to [place] for abnormal immunofixation.  Patient was noted to have a 10 mm lucent lesion on her right ilium on the CT renal stone study.  This was reported to be nonspecific and could represent fibro-osseous lesion.  Differential also includes metastatic disease and myeloma.  She was also noted to have a left adnexal mass 2.1 x 1.2 x 2.2 cm which was subsequently thought to be a possible dermoid cyst for which she followed up with GYN.  She therefore underwent a myeloma work-up which showed no M protein on SPEP but was found to have IgG monoclonal lambda protein on immunofixation.  CMP was normal with normal creatinine and calcium and total protein.  CBC was within normal limits with an H&H of 13.3/40.    Interval history patient reports doing well and has not had any recent health issues.No recent hospitalizations.  Appetite and weight have remained stable.   Review of Systems  Constitutional:  Negative for chills, fever, malaise/fatigue and weight loss.  HENT:  Negative for congestion, ear discharge and nosebleeds.   Eyes:  Negative for  blurred vision.  Respiratory:  Negative for cough, hemoptysis, sputum production, shortness of breath and wheezing.   Cardiovascular:  Negative for chest pain, palpitations, orthopnea and claudication.  Gastrointestinal:  Negative for abdominal pain, blood in stool, constipation, diarrhea, heartburn, melena, nausea and vomiting.  Genitourinary:  Negative for dysuria, flank pain, frequency, hematuria and urgency.  Musculoskeletal:  Negative for back pain, joint pain and myalgias.  Skin:  Negative for rash.  Neurological:  Negative for dizziness, tingling, focal weakness, seizures, weakness and headaches.  Endo/Heme/Allergies:  Does not bruise/bleed easily.  Psychiatric/Behavioral:  Negative for depression and suicidal ideas. The patient does not have insomnia.    Allergies  Allergen Reactions   Other     Cats, Mold and Feathers   Prednisone     Other reaction(s): Vomiting   Tape     Past Medical History:  Diagnosis Date   A-fib ([facility])    Allergy    Anxiety    GERD (gastroesophageal reflux disease)    Hypothyroid     Past Surgical History:  Procedure Laterality Date   AUGMENTATION MAMMAPLASTY Bilateral 1990   saline   BRAIN SURGERY     CARDIAC ELECTROPHYSIOLOGY MAPPING AND ABLATION     COLONOSCOPY WITH PROPOFOL N/A 04/13/2020   Procedure: COLONOSCOPY WITH PROPOFOL;  Surgeon: [name], MD;  Location: [facility] ENDOSCOPY;  Service: Gastroenterology;  Laterality: N/A;   ESOPHAGOGASTRODUODENOSCOPY (EGD) WITH PROPOFOL N/A 04/13/2020   Procedure: ESOPHAGOGASTRODUODENOSCOPY (EGD) WITH PROPOFOL;  Surgeon: [name], MD;  Location: [facility] ENDOSCOPY;  Service: Gastroenterology;  Laterality: N/A;   ESOPHAGOGASTRODUODENOSCOPY (EGD) WITH PROPOFOL N/A 07/16/2020  Procedure: ESOPHAGOGASTRODUODENOSCOPY (EGD) WITH PROPOFOL;  Surgeon: [name], MD;  Location: [facility] ENDOSCOPY;  Service: Gastroenterology;  Laterality: N/A;    Social History   Socioeconomic History   Marital status: Married     Spouse name: Not on file   Number of children: Not on file   Years of education: Not on file   Highest education level: Not on file  Occupational History   Not on file  Tobacco Use   Smoking status: Never   Smokeless tobacco: Never  Vaping Use   Vaping Use: Never used  Substance and Sexual Activity   Alcohol use: Yes    Alcohol/week: 10.0 standard drinks    Types: 10 Glasses of wine per week   Drug use: Never   Sexual activity: Yes  Other Topics Concern   Not on file  Social History Narrative   Not on file   Social Determinants of Health   Financial Resource Strain: Not on file  Food Insecurity: Not on file  Transportation Needs: Not on file  Physical Activity: Not on file  Stress: Not on file  Social Connections: Not on file  Intimate Partner Violence: Not on file    Family History  Problem Relation Age of Onset   Healthy Father    Cancer Paternal Uncle    Stroke Paternal Grandmother    Heart attack Paternal Grandfather      Current Outpatient Medications:    atorvastatin (LIPITOR) 40 MG tablet, TAKE 1 TABLET BY MOUTH ONCE DAILY, Disp: 90 tablet, Rfl: 3   clonazePAM (KLONOPIN) 0.5 MG tablet, Take 1 tablet (0.5 mg total) by mouth daily as needed (will cause drowsiness)., Disp: 90 tablet, Rfl: 1   esomeprazole (NEXIUM) 40 MG capsule, TAKE 1 CAPSULE BY MOUTH ONCE A DAY, Disp: 90 capsule, Rfl: 3   ibuprofen (ADVIL) 800 MG tablet, TAKE 1 TABLET BY MOUTH ONCE A DAY AS NEEDED, Disp: 90 tablet, Rfl: 0   levothyroxine (SYNTHROID) 50 MCG tablet, Take 50 mcg by mouth every morning., Disp: , Rfl:    cholecalciferol (VITAMIN D3) 25 MCG (1000 UNIT) tablet, Take 4,000 Units by mouth daily., Disp: , Rfl:    dicyclomine (BENTYL) 10 MG capsule, Take 1 capsule (10 mg total) by mouth 3 (three) times daily between meals as needed for spasms. (Patient not taking: Reported on 08/28/2020), Disp: 90 capsule, Rfl: 3   esomeprazole (NEXIUM) 40 MG capsule, , Disp: , Rfl:    esomeprazole  (NEXIUM) 40 MG capsule, TAKE 1 CAPSULE BY MOUTH ONCE A DAY, Disp: 90 capsule, Rfl: 3   levothyroxine (SYNTHROID) 50 MCG tablet, Take 1 tablet by mouth in the morning on an empty stomach daily, Disp: 90 tablet, Rfl: 4   metroNIDAZOLE (METROCREAM) 0.75 % cream, APPLY THIN LAYER TO FACE EACH MORNING (Patient not taking: Reported on 08/28/2020), Disp: 45 g, Rfl: 2   Multiple Vitamins-Minerals (HM MULTIVITAMIN ADULT GUMMY) CHEW, Chew by mouth. (Patient not taking: Reported on 08/28/2020), Disp: , Rfl:    Omega-3 Fatty Acids (FISH OIL) 1000 MG CAPS, Take 1,000 mg by mouth. (Patient not taking: Reported on 08/28/2020), Disp: , Rfl:   No results found.  No images are attached to the encounter.   CMP Latest Ref Rng & Units 08/21/2020  Glucose 70 - 99 mg/dL 105(H)  BUN 6 - 20 mg/dL 8  Creatinine 0.44 - 1.00 mg/dL 0.59  Sodium 135 - 145 mmol/L 138  Potassium 3.5 - 5.1 mmol/L 3.7  Chloride 98 - 111 mmol/L 100  CO2 22 - 32 mmol/L 24  Calcium 8.9 - 10.3 mg/dL 8.9  Total Protein 6.5 - 8.1 g/dL 7.7  Total Bilirubin 0.3 - 1.2 mg/dL 0.9  Alkaline Phos 38 - 126 U/L 99  AST 15 - 41 U/L 77(H)  ALT 0 - 44 U/L 35   CBC Latest Ref Rng & Units 08/21/2020  WBC 4.0 - 10.5 K/uL 6.6  Hemoglobin 12.0 - 15.0 g/dL 15.1(H)  Hematocrit 36.0 - 46.0 % 43.8  Platelets 150 - 400 K/uL 289     Observation/objective: Appears in no acute distress over video visit today.  Breathing is nonlabored  Assessment and plan: Patient is a 60 year old female with a history of IgG lambda MGUS here for routine follow-up  Based on her recent labs patient does not have any evidence of anemia acute kidney injury or hypercalcemia.  Myeloma panel done a year ago as well as presently does not show any evidence of M proteinOn SPEP or immunofixation.  Polyclonal increase noted in 1 or more immunoglobulins.  Serum free light chain was normal.  Given that there has been no M protein detected on 2 consecutive occasions she does not require a  follow-up with hematology for this.  She does not completely elevated hemoglobin of 15 which can be monitored by her primary care provider.  In her hemoglobin she can be referred to [place] in the future.  She does not meet definition of polycythemia which would be a hemoglobin of greater than 16.  Patient noted to have a mildly elevated AST of 77 on today's labs which is somewhat higher as compared to the last 2 values.She was recently seen by GI for Barrett's esophagus as well.  I will let them know about her AST elevation.  Previously noted 10 mm lucent lesion in the right ilium was more consistent with a benign fibro-osseous or lipomatous lesion and no evaluation was recommended for the same.  That does not need to be followed up  Follow-up instructions: No follow-up needed  I discussed the assessment and treatment plan with the patient. The patient was provided an opportunity to ask questions and all were answered. The patient agreed with the plan and demonstrated an understanding of the instructions.   The patient was advised to call back or seek an in-person evaluation if the symptoms worsen or if the condition fails to improve as anticipated.   Visit Diagnosis: 1. MGUS (monoclonal gammopathy of unknown significance)     Dr. Randa Evens, MD, MPH [facility] Tel- [phone] 08/28/2020 3:48 PM

## 2020-09-03 ENCOUNTER — Encounter: Payer: Self-pay | Admitting: Adult Health

## 2020-09-04 ENCOUNTER — Telehealth: Payer: Self-pay

## 2020-09-04 NOTE — Telephone Encounter (Signed)
Called patient to let her know that Dr. Vicente Males wanted her to be seen for an appointment. However, patient stated that she was at work and that she needed to call back to schedule it. Patient will need an appointment in two weeks with Dr. Vicente Males per Dr. Georgeann Oppenheim request. Please read below.  Hi Kristi Carter , already following with oncology , does not need to see me at 1 today , oncology aware no need for GI folow up

## 2020-09-04 NOTE — Telephone Encounter (Signed)
-----   Message from [name], MD sent at 09/03/2020 12:34 PM EDT ----- Kristi Carter , offer office visit after next week to evaluate elevated liver tests for the patient

## 2020-09-06 ENCOUNTER — Encounter: Payer: No Typology Code available for payment source | Admitting: Adult Health

## 2020-09-30 MED FILL — Atorvastatin Calcium Tab 40 MG (Base Equivalent): ORAL | 90 days supply | Qty: 90 | Fill #1 | Status: AC

## 2020-10-01 ENCOUNTER — Other Ambulatory Visit (HOSPITAL_COMMUNITY): Payer: Self-pay

## 2020-10-14 ENCOUNTER — Encounter: Payer: Self-pay | Admitting: Adult Health

## 2020-10-15 ENCOUNTER — Other Ambulatory Visit: Payer: Self-pay | Admitting: Family

## 2020-10-15 ENCOUNTER — Other Ambulatory Visit: Payer: Self-pay

## 2020-10-15 ENCOUNTER — Other Ambulatory Visit (HOSPITAL_COMMUNITY): Payer: Self-pay

## 2020-10-15 MED ORDER — IBUPROFEN 800 MG PO TABS
ORAL_TABLET | Freq: Every day | ORAL | 0 refills | Status: DC | PRN
Start: 2020-10-15 — End: 2020-12-17
  Filled 2020-10-15: qty 90, 90d supply, fill #0

## 2020-10-25 ENCOUNTER — Encounter: Payer: Self-pay | Admitting: Oncology

## 2020-10-26 NOTE — Telephone Encounter (Signed)
Refill already done. 

## 2020-11-10 ENCOUNTER — Other Ambulatory Visit (HOSPITAL_COMMUNITY): Payer: Self-pay

## 2020-11-14 ENCOUNTER — Encounter: Payer: No Typology Code available for payment source | Admitting: Adult Health

## 2020-11-16 ENCOUNTER — Other Ambulatory Visit (HOSPITAL_COMMUNITY): Payer: Self-pay

## 2020-11-30 ENCOUNTER — Other Ambulatory Visit (HOSPITAL_COMMUNITY): Payer: Self-pay

## 2020-12-11 ENCOUNTER — Encounter: Payer: No Typology Code available for payment source | Admitting: Adult Health

## 2020-12-17 ENCOUNTER — Other Ambulatory Visit: Payer: Self-pay

## 2020-12-17 ENCOUNTER — Other Ambulatory Visit (HOSPITAL_COMMUNITY): Payer: Self-pay

## 2020-12-17 ENCOUNTER — Encounter: Payer: Self-pay | Admitting: Family

## 2020-12-17 ENCOUNTER — Ambulatory Visit (INDEPENDENT_AMBULATORY_CARE_PROVIDER_SITE_OTHER): Payer: No Typology Code available for payment source | Admitting: Family

## 2020-12-17 VITALS — BP 104/70 | HR 89 | Temp 98.2°F | Ht 63.0 in | Wt 154.6 lb

## 2020-12-17 DIAGNOSIS — F419 Anxiety disorder, unspecified: Secondary | ICD-10-CM

## 2020-12-17 DIAGNOSIS — Z1231 Encounter for screening mammogram for malignant neoplasm of breast: Secondary | ICD-10-CM | POA: Diagnosis not present

## 2020-12-17 DIAGNOSIS — Z0000 Encounter for general adult medical examination without abnormal findings: Secondary | ICD-10-CM

## 2020-12-17 MED ORDER — HYDROXYZINE HCL 10 MG PO TABS
10.0000 mg | ORAL_TABLET | Freq: Two times a day (BID) | ORAL | 1 refills | Status: DC | PRN
  Filled 2020-12-17: qty 60, 30d supply, fill #0

## 2020-12-17 NOTE — Assessment & Plan Note (Signed)
Mammogram scheduled.  Patient is overdue for Pap smear and I have expressed the importance of making a follow-up with Alicia Copland.  Referral has been placed as well.  Patient politely declines clinical breast exam pelvic exam in the office.

## 2020-12-17 NOTE — Patient Instructions (Addendum)
Trial of klonopin half tablet every other day. No alcohol use with klonopin.   Trial of atarax 10mg  twice per day as needed. Goal is to further decrease klonopin   Please make a follow up with Elmo Putt Copland for pap smear.   Mammogram as scheduled  Health Maintenance for Postmenopausal Women Menopause is a normal process in which your ability to get pregnant comes to an end. This process happens slowly over many months or years, usually between the ages of 25 and 32. Menopause is complete when you have missed your menstrual periods for 12 months. It is important to talk with your health care provider about some of the most common conditions that affect women after menopause (postmenopausal women). These include heart disease, cancer, and bone loss (osteoporosis). Adopting a healthy lifestyle and getting preventive care can help to promote your health and wellness. The actions you take can also lower your chances of developing some of these common conditions. What should I know about menopause? During menopause, you may get a number of symptoms, such as: Hot flashes. These can be moderate or severe. Night sweats. Decrease in sex drive. Mood swings. Headaches. Tiredness. Irritability. Memory problems. Insomnia. Choosing to treat or not to treat these symptoms is a decision that you make with your health care provider. Do I need hormone replacement therapy? Hormone replacement therapy is effective in treating symptoms that are caused by menopause, such as hot flashes and night sweats. Hormone replacement carries certain risks, especially as you become older. If you are thinking about using estrogen or estrogen with progestin, discuss the benefits and risks with your health care provider. What is my risk for heart disease and stroke? The risk of heart disease, heart attack, and stroke increases as you age. One of the causes may be a change in the body's hormones during menopause. This can  affect how your body uses dietary fats, triglycerides, and cholesterol. Heart attack and stroke are medical emergencies. There are many things that you can do to help prevent heart disease and stroke. Watch your blood pressure High blood pressure causes heart disease and increases the risk of stroke. This is more likely to develop in people who have high blood pressure readings, are of African descent, or are overweight. Have your blood pressure checked: Every 3-5 years if you are 59-3 years of age. Every year if you are 82 years old or older. Eat a healthy diet  Eat a diet that includes plenty of vegetables, fruits, low-fat dairy products, and lean protein. Do not eat a lot of foods that are high in solid fats, added sugars, or sodium. Get regular exercise Get regular exercise. This is one of the most important things you can do for your health. Most adults should: Try to exercise for at least 150 minutes each week. The exercise should increase your heart rate and make you sweat (moderate-intensity exercise). Try to do strengthening exercises at least twice each week. Do these in addition to the moderate-intensity exercise. Spend less time sitting. Even light physical activity can be beneficial. Other tips Work with your health care provider to achieve or maintain a healthy weight. Do not use any products that contain nicotine or tobacco, such as cigarettes, e-cigarettes, and chewing tobacco. If you need help quitting, ask your health care provider. Know your numbers. Ask your health care provider to check your cholesterol and your blood sugar (glucose). Continue to have your blood tested as directed by your health care provider. Do I  need screening for cancer? Depending on your health history and family history, you may need to have cancer screening at different stages of your life. This may include screening for: Breast cancer. Cervical cancer. Lung cancer. Colorectal cancer. What is my  risk for osteoporosis? After menopause, you may be at increased risk for osteoporosis. Osteoporosis is a condition in which bone destruction happens more quickly than new bone creation. To help prevent osteoporosis or the bone fractures that can happen because of osteoporosis, you may take the following actions: If you are 66-51 years old, get at least 1,000 mg of calcium and at least 600 mg of vitamin D per day. If you are older than age 63 but younger than age 10, get at least 1,200 mg of calcium and at least 600 mg of vitamin D per day. If you are older than age 46, get at least 1,200 mg of calcium and at least 800 mg of vitamin D per day. Smoking and drinking excessive alcohol increase the risk of osteoporosis. Eat foods that are rich in calcium and vitamin D, and do weight-bearing exercises several times each week as directed by your health care provider. How does menopause affect my mental health? Depression may occur at any age, but it is more common as you become older. Common symptoms of depression include: Low or sad mood. Changes in sleep patterns. Changes in appetite or eating patterns. Feeling an overall lack of motivation or enjoyment of activities that you previously enjoyed. Frequent crying spells. Talk with your health care provider if you think that you are experiencing depression. General instructions See your health care provider for regular wellness exams and vaccines. This may include: Scheduling regular health, dental, and eye exams. Getting and maintaining your vaccines. These include: Influenza vaccine. Get this vaccine each year before the flu season begins. Pneumonia vaccine. Shingles vaccine. Tetanus, diphtheria, and pertussis (Tdap) booster vaccine. Your health care provider may also recommend other immunizations. Tell your health care provider if you have ever been abused or do not feel safe at home. Summary Menopause is a normal process in which your ability to  get pregnant comes to an end. This condition causes hot flashes, night sweats, decreased interest in sex, mood swings, headaches, or lack of sleep. Treatment for this condition may include hormone replacement therapy. Take actions to keep yourself healthy, including exercising regularly, eating a healthy diet, watching your weight, and checking your blood pressure and blood sugar levels. Get screened for cancer and depression. Make sure that you are up to date with all your vaccines. This information is not intended to replace advice given to you by your health care provider. Make sure you discuss any questions you have with your health care provider. Document Revised: 02/17/2018 Document Reviewed: 02/17/2018 Elsevier Patient Education  2022 [employer].

## 2020-12-17 NOTE — Assessment & Plan Note (Addendum)
Uncontrolled. Discussed risks of long term use of BZDs including dementia. She was very willing to trial to decrease. Trial of klonopin half tablet every other day. Counseled on the importance of no alcohol use with klonopin.  During taper, trial of atarax 10mg  twice per day as needed. Goal is to further decrease klonopin to prn status or discontinue all together. She declines trial of SSRI, counseling at this time. Close follow up

## 2020-12-17 NOTE — Progress Notes (Addendum)
Subjective:    Patient ID: Kristi Carter, female    DOB: 03-Nov-1960, 60 y.o.   MRN: [id number]  CC: Kristi Carter is a 60 y.o. female who presents today for physical exam.    HPI: Here today for annual physical, patient follows with colleague Laverna Peace.  She also follows with GYN, Alicia Copland.  Politely declines getting undressed for clinical breast exam, pelvic exam.  She had been on klonopin 0.5mg  since her 20's ; takes half tablet once daily, usually in the morning. No depression. Sleeping well. Tried counseling in the past without relief.    She works from home.    Colorectal Cancer Screening: due 04/2030  Breast Cancer Screening: Mammogram due Cervical Cancer Screening:due; pap 08/01/2019 positive HPV with El[facility] Putt Copland; due to repeat in 1 year.         Labs: Screening labs today. Exercise: No regular exercise.  She is active in the yard, housework. No cp, sob.  Alcohol use:  drinks 1-2 glasses of wine per night.  Smoking/tobacco use: Nonsmoker.     HISTORY:  Past Medical History:  Diagnosis Date  . A-fib ([place])   . Allergy   . Anxiety   . GERD (gastroesophageal reflux disease)   . Hypothyroid     Past Surgical History:  Procedure Laterality Date  . AUGMENTATION MAMMAPLASTY Bilateral 1990   saline  . BRAIN SURGERY    . CARDIAC ELECTROPHYSIOLOGY MAPPING AND ABLATION    . COLONOSCOPY WITH PROPOFOL N/A 04/13/2020   Procedure: COLONOSCOPY WITH PROPOFOL;  Surgeon: [name], MD;  Location: [facility] ENDOSCOPY;  Service: Gastroenterology;  Laterality: N/A;  . ESOPHAGOGASTRODUODENOSCOPY (EGD) WITH PROPOFOL N/A 04/13/2020   Procedure: ESOPHAGOGASTRODUODENOSCOPY (EGD) WITH PROPOFOL;  Surgeon: [name], MD;  Location: [facility] ENDOSCOPY;  Service: Gastroenterology;  Laterality: N/A;  . ESOPHAGOGASTRODUODENOSCOPY (EGD) WITH PROPOFOL N/A 07/16/2020   Procedure: ESOPHAGOGASTRODUODENOSCOPY (EGD) WITH PROPOFOL;  Surgeon: [name], MD;  Location: [facility] ENDOSCOPY;  Service:  Gastroenterology;  Laterality: N/A;   Family History  Problem Relation Age of Onset  . Healthy Father   . Cancer Paternal Uncle   . Stroke Paternal Grandmother   . Heart attack Paternal Grandfather       ALLERGIES: Other, Prednisone, and Tape  Current Outpatient Medications on File Prior to Visit  Medication Sig Dispense Refill  . atorvastatin (LIPITOR) 40 MG tablet TAKE 1 TABLET BY MOUTH ONCE DAILY 90 tablet 3  . clonazePAM (KLONOPIN) 0.5 MG tablet Take 1 tablet (0.5 mg total) by mouth daily as needed (will cause drowsiness). 90 tablet 1  . esomeprazole (NEXIUM) 40 MG capsule TAKE 1 CAPSULE BY MOUTH ONCE A DAY 90 capsule 3  . levothyroxine (SYNTHROID) 50 MCG tablet Take 50 mcg by mouth every morning.    Marland Kitchen esomeprazole (NEXIUM) 40 MG capsule      No current facility-administered medications on file prior to visit.    Social History   Tobacco Use  . Smoking status: Never  . Smokeless tobacco: Never  Vaping Use  . Vaping Use: Never used  Substance Use Topics  . Alcohol use: Yes    Alcohol/week: 10.0 standard drinks    Types: 10 Glasses of wine per week  . Drug use: Never    Review of Systems  Constitutional:  Negative for chills, fever and unexpected weight change.  HENT:  Negative for congestion.   Respiratory:  Negative for cough.   Cardiovascular:  Negative for chest pain, palpitations and leg swelling.  Gastrointestinal:  Negative  for nausea and vomiting.  Musculoskeletal:  Negative for arthralgias and myalgias.  Skin:  Negative for rash.  Neurological:  Negative for headaches.  Hematological:  Negative for adenopathy.  Psychiatric/Behavioral:  Negative for confusion. The patient is nervous/anxious.      Objective:    BP 104/70 (BP Location: Left Arm, Patient Position: Sitting, Cuff Size: Normal)   Pulse 89   Temp 98.2 F (36.8 C) (Oral)   Ht 5\' 3"  (1.6 m)   Wt 154 lb 9.6 oz (70.1 kg)   SpO2 97%   BMI 27.39 kg/m   BP Readings from Last 3 Encounters:   12/17/20 104/70  07/16/20 138/79  04/30/20 102/72   Wt Readings from Last 3 Encounters:  12/17/20 154 lb 9.6 oz (70.1 kg)  07/16/20 158 lb (71.7 kg)  04/30/20 158 lb 3.2 oz (71.8 kg)    Physical Exam Vitals reviewed.  Constitutional:      Appearance: She is well-developed.  Eyes:     Conjunctiva/sclera: Conjunctivae normal.  Neck:     Thyroid: No thyroid mass or thyromegaly.  Cardiovascular:     Rate and Rhythm: Normal rate and regular rhythm.     Pulses: Normal pulses.     Heart sounds: Normal heart sounds.  Pulmonary:     Effort: Pulmonary effort is normal.     Breath sounds: Normal breath sounds. No wheezing, rhonchi or rales.  Lymphadenopathy:     Head:     Right side of head: No submental, submandibular, tonsillar, preauricular, posterior auricular or occipital adenopathy.     Left side of head: No submental, submandibular, tonsillar, preauricular, posterior auricular or occipital adenopathy.     Cervical: No cervical adenopathy.  Skin:    General: Skin is warm and dry.  Neurological:     Mental Status: She is alert.  Psychiatric:        Speech: Speech normal.        Behavior: Behavior normal.        Thought Content: Thought content normal.       Assessment & Plan:   Problem List Items Addressed This Visit       Other   Anxiety disorder    Uncontrolled. Discussed risks of long term use of BZDs including dementia. She was very willing to trial to decrease. Trial of klonopin half tablet every other day. Counseled on the importance of no alcohol use with klonopin.  During taper, trial of atarax 10mg  twice per day as needed. Goal is to further decrease klonopin to prn status or discontinue all together. She declines trial of SSRI, counseling at this time. Close follow up      Relevant Medications   hydrOXYzine (ATARAX/VISTARIL) 10 MG tablet   Routine physical examination - Primary    Mammogram scheduled.  Patient is overdue for Pap smear and I have expressed  the importance of making a follow-up with Alicia Copland.  Referral has been placed as well.  Patient politely declines clinical breast exam pelvic exam in the office.      Relevant Orders   Ambulatory referral to Obstetrics / Gynecology   TSH (Completed)   CBC with Differential/Platelet (Completed)   Comprehensive metabolic panel (Completed)   Hemoglobin A1c (Completed)   Lipid panel (Completed)   VITAMIN D 25 Hydroxy (Vit-D Deficiency, Fractures) (Completed)   Other Visit Diagnoses     Encounter for screening mammogram for malignant neoplasm of breast       Relevant Orders   MM 3D SCREEN  BREAST W/IMPLANT BILATERAL        I have discontinued Karima M. Selsor's Fish Oil, cholecalciferol, HM Multivitamin Adult Gummy, metroNIDAZOLE, dicyclomine, and ibuprofen. I am also having her start on hydrOXYzine. Additionally, I am having her maintain her levothyroxine, esomeprazole, atorvastatin, esomeprazole, and clonazePAM.   Meds ordered this encounter  Medications  . hydrOXYzine (ATARAX/VISTARIL) 10 MG tablet    Sig: Take 1 tablet (10 mg total) by mouth 2 (two) times daily as needed for anxiety.    Dispense:  60 tablet    Refill:  1    Order Specific Question:   Supervising Provider    Answer:   Crecencio Mc [[id number]]    Return precautions given.   Risks, benefits, and alternatives of the medications and treatment plan prescribed today were discussed, and patient expressed understanding.   Education regarding symptom management and diagnosis given to patient on AVS.   Continue to follow with Flinchum, Kelby Aline, FNP for routine health maintenance.   Kristi Carter and I agreed with plan.   Mable Paris, FNP

## 2020-12-18 ENCOUNTER — Telehealth: Payer: Self-pay

## 2020-12-18 LAB — LIPID PANEL
Cholesterol: 212 mg/dL — ABNORMAL HIGH (ref 0–200)
HDL: 85.4 mg/dL (ref >39.00)
LDL Cholesterol: 107 mg/dL — ABNORMAL HIGH (ref 0–99)
NonHDL: 126.85
Total CHOL/HDL Ratio: 2
Triglycerides: 97 mg/dL (ref 0.0–149.0)
VLDL: 19.4 mg/dL (ref 0.0–40.0)

## 2020-12-18 LAB — CBC WITH DIFFERENTIAL/PLATELET
Basophils Absolute: 0.1 10*3/uL (ref 0.0–0.1)
Basophils Relative: 1.3 % (ref 0.0–3.0)
Eosinophils Absolute: 0.1 10*3/uL (ref 0.0–0.7)
Eosinophils Relative: 1.1 % (ref 0.0–5.0)
HCT: 41.9 % (ref 36.0–46.0)
Hemoglobin: 14.1 g/dL (ref 12.0–15.0)
Lymphocytes Relative: 21 % (ref 12.0–46.0)
Lymphs Abs: 1.1 10*3/uL (ref 0.7–4.0)
MCHC: 33.6 g/dL (ref 30.0–36.0)
MCV: 102.8 fl — ABNORMAL HIGH (ref 78.0–100.0)
Monocytes Absolute: 0.5 10*3/uL (ref 0.1–1.0)
Monocytes Relative: 9.3 % (ref 3.0–12.0)
Neutro Abs: 3.4 10*3/uL (ref 1.4–7.7)
Neutrophils Relative %: 67.3 % (ref 43.0–77.0)
Platelets: 262 10*3/uL (ref 150.0–400.0)
RBC: 4.08 Mil/uL (ref 3.87–5.11)
RDW: 14 % (ref 11.5–15.5)
WBC: 5.1 10*3/uL (ref 4.0–10.5)

## 2020-12-18 LAB — COMPREHENSIVE METABOLIC PANEL
ALT: 29 U/L (ref 0–35)
AST: 67 U/L — ABNORMAL HIGH (ref 0–37)
Albumin: 4 g/dL (ref 3.5–5.2)
Alkaline Phosphatase: 100 U/L (ref 39–117)
BUN: 8 mg/dL (ref 6–23)
CO2: 27 mEq/L (ref 19–32)
Calcium: 9 mg/dL (ref 8.4–10.5)
Chloride: 99 mEq/L (ref 96–112)
Creatinine, Ser: 0.46 mg/dL (ref 0.40–1.20)
GFR: 105.36 mL/min (ref >60.00)
Glucose, Bld: 80 mg/dL (ref 70–99)
Potassium: 3.8 mEq/L (ref 3.5–5.1)
Sodium: 136 mEq/L (ref 135–145)
Total Bilirubin: 1.1 mg/dL (ref 0.2–1.2)
Total Protein: 6.8 g/dL (ref 6.0–8.3)

## 2020-12-18 LAB — HEMOGLOBIN A1C: Hgb A1c MFr Bld: 5.2 % (ref 4.6–6.5)

## 2020-12-18 LAB — TSH: TSH: 1.6 u[IU]/mL (ref 0.35–5.50)

## 2020-12-18 LAB — VITAMIN D 25 HYDROXY (VIT D DEFICIENCY, FRACTURES): VITD: 35.05 ng/mL (ref 30.00–100.00)

## 2020-12-18 NOTE — Telephone Encounter (Signed)
[facility] referring for annual GYN, pap smear. With [facility]. Called and left voicemail for patient to call back to be scheduled.

## 2020-12-20 ENCOUNTER — Telehealth: Payer: Self-pay | Admitting: Adult Health

## 2020-12-20 ENCOUNTER — Other Ambulatory Visit: Payer: Self-pay | Admitting: Family

## 2020-12-20 DIAGNOSIS — R748 Abnormal levels of other serum enzymes: Secondary | ICD-10-CM

## 2020-12-20 NOTE — Telephone Encounter (Signed)
Lft pt vm to call ofc to sch US. thanks ?

## 2020-12-21 ENCOUNTER — Telehealth: Payer: Self-pay | Admitting: Adult Health

## 2020-12-21 ENCOUNTER — Encounter: Payer: Self-pay | Admitting: Family

## 2020-12-21 NOTE — Telephone Encounter (Signed)
Lft pt vm to call ofc to sch US. thanks ?

## 2020-12-26 ENCOUNTER — Encounter: Payer: Self-pay | Admitting: Family

## 2020-12-26 ENCOUNTER — Telehealth: Payer: Self-pay | Admitting: Adult Health

## 2020-12-26 DIAGNOSIS — R748 Abnormal levels of other serum enzymes: Secondary | ICD-10-CM | POA: Insufficient documentation

## 2020-12-26 NOTE — Telephone Encounter (Signed)
Lft pt vm to call ofc to sch US. thanks ?

## 2020-12-31 NOTE — Telephone Encounter (Signed)
For your information  

## 2021-01-01 ENCOUNTER — Other Ambulatory Visit (HOSPITAL_COMMUNITY): Payer: Self-pay

## 2021-01-01 MED FILL — Atorvastatin Calcium Tab 40 MG (Base Equivalent): ORAL | 90 days supply | Qty: 90 | Fill #2 | Status: AC

## 2021-01-02 ENCOUNTER — Other Ambulatory Visit: Payer: Self-pay

## 2021-01-02 ENCOUNTER — Ambulatory Visit
Admission: RE | Admit: 2021-01-02 | Discharge: 2021-01-02 | Disposition: A | Discharge status: Home / Self Care | Payer: No Typology Code available for payment source | Source: Ambulatory Visit | Attending: Family | Admitting: Family

## 2021-01-02 ENCOUNTER — Encounter: Payer: Self-pay | Admitting: Family

## 2021-01-02 DIAGNOSIS — Z1231 Encounter for screening mammogram for malignant neoplasm of breast: Secondary | ICD-10-CM | POA: Diagnosis not present

## 2021-01-04 ENCOUNTER — Other Ambulatory Visit: Payer: Self-pay | Admitting: Family

## 2021-01-04 DIAGNOSIS — E785 Hyperlipidemia, unspecified: Secondary | ICD-10-CM

## 2021-01-09 ENCOUNTER — Other Ambulatory Visit (HOSPITAL_COMMUNITY)
Admission: RE | Admit: 2021-01-09 | Discharge: 2021-01-09 | Disposition: A | Discharge status: Home / Self Care | Payer: No Typology Code available for payment source | Source: Ambulatory Visit | Attending: Obstetrics and Gynecology | Admitting: Obstetrics and Gynecology

## 2021-01-09 ENCOUNTER — Ambulatory Visit (INDEPENDENT_AMBULATORY_CARE_PROVIDER_SITE_OTHER): Payer: No Typology Code available for payment source | Admitting: Obstetrics and Gynecology

## 2021-01-09 ENCOUNTER — Other Ambulatory Visit: Payer: Self-pay

## 2021-01-09 ENCOUNTER — Encounter: Payer: Self-pay | Admitting: Obstetrics and Gynecology

## 2021-01-09 VITALS — BP 120/80 | Ht 63.0 in | Wt 154.0 lb

## 2021-01-09 DIAGNOSIS — Z1151 Encounter for screening for human papillomavirus (HPV): Secondary | ICD-10-CM | POA: Diagnosis present

## 2021-01-09 DIAGNOSIS — Z124 Encounter for screening for malignant neoplasm of cervix: Secondary | ICD-10-CM | POA: Insufficient documentation

## 2021-01-09 DIAGNOSIS — R87810 Cervical high risk human papillomavirus (HPV) DNA test positive: Secondary | ICD-10-CM | POA: Insufficient documentation

## 2021-01-09 DIAGNOSIS — Z1231 Encounter for screening mammogram for malignant neoplasm of breast: Secondary | ICD-10-CM

## 2021-01-09 DIAGNOSIS — Z01419 Encounter for gynecological examination (general) (routine) without abnormal findings: Secondary | ICD-10-CM

## 2021-01-09 NOTE — Patient Instructions (Signed)
I value your feedback and you entrusting us with your care. If you get a [facility] patient survey, I would appreciate you taking the time to let us know about your experience today. Thank you! ? ? ?

## 2021-01-09 NOTE — Progress Notes (Signed)
PCP: Doreen Beam, FNP   Chief Complaint  Patient presents with   Gynecologic Exam    No concerns    HPI:      Ms. Kristi Carter is a 60 y.o. No obstetric history on file. whose LMP was No LMP recorded. Patient is postmenopausal., presents today for her annual examination.  Her menses are absent due to menopause (LMP> 10 yrs ago). No PMB. She does have occas vasomotor sx.   Sex activity: occas sexually active. She does not have vaginal dryness.  Last Pap: 07/27/19 Results were normal/POS HPV DNA, repeat pap due today; no prior hx of abn paps  No recent UTI/pyelo. Multiple myeloma eval was neg last yr.   Last mammogram: 01/02/21 Results were normal, repeat in 12 months.  There is no FH of breast cancer. There is no FH of ovarian cancer. The patient does self-breast exams.  Colonoscopy: with EGD 07/2020 with Dr. Bailey Mech.  Repeat due after 10 years per pt. Hx of celiac/Barrett's esophagus--repeat EGD due 2 yrs.  Tobacco use: The patient denies current or previous tobacco use. Alcohol use: social No drug use. Exercise: not active  She does get adequate calcium and Vitamin D in her diet. Hx of Vit D deficiency.  Labs with PCP.    Past Medical History:  Diagnosis Date   A-fib ([place])    Allergy    Anxiety    GERD (gastroesophageal reflux disease)    Hypothyroid     Past Surgical History:  Procedure Laterality Date   AUGMENTATION MAMMAPLASTY Bilateral 1990   saline   BRAIN SURGERY     CARDIAC ELECTROPHYSIOLOGY MAPPING AND ABLATION     COLONOSCOPY WITH PROPOFOL N/A 04/13/2020   Procedure: COLONOSCOPY WITH PROPOFOL;  Surgeon: [name], MD;  Location: [facility] ENDOSCOPY;  Service: Gastroenterology;  Laterality: N/A;   ESOPHAGOGASTRODUODENOSCOPY (EGD) WITH PROPOFOL N/A 04/13/2020   Procedure: ESOPHAGOGASTRODUODENOSCOPY (EGD) WITH PROPOFOL;  Surgeon: [name], MD;  Location: [facility] ENDOSCOPY;  Service: Gastroenterology;  Laterality: N/A;   ESOPHAGOGASTRODUODENOSCOPY (EGD)  WITH PROPOFOL N/A 07/16/2020   Procedure: ESOPHAGOGASTRODUODENOSCOPY (EGD) WITH PROPOFOL;  Surgeon: [name], MD;  Location: [facility] ENDOSCOPY;  Service: Gastroenterology;  Laterality: N/A;    Family History  Problem Relation Age of Onset   Healthy Father    Cancer Paternal Uncle        started with prostate and spread to entire body   Stroke Paternal Grandmother    Heart attack Paternal Grandfather     Social History   Socioeconomic History   Marital status: Married    Spouse name: Not on file   Number of children: Not on file   Years of education: Not on file   Highest education level: Not on file  Occupational History   Not on file  Tobacco Use   Smoking status: Never   Smokeless tobacco: Never  Vaping Use   Vaping Use: Never used  Substance and Sexual Activity   Alcohol use: Yes    Alcohol/week: 10.0 standard drinks    Types: 10 Glasses of wine per week   Drug use: Never   Sexual activity: Not Currently    Birth control/protection: Post-menopausal  Other Topics Concern   Not on file  Social History Narrative   Not on file   Social Determinants of Health   Financial Resource Strain: Not on file  Food Insecurity: Not on file  Transportation Needs: Not on file  Physical Activity: Not on file  Stress: Not on file  Social Connections: Not on file  Intimate Partner Violence: Not on file     Current Outpatient Medications:    atorvastatin (LIPITOR) 40 MG tablet, TAKE 1 TABLET BY MOUTH ONCE DAILY, Disp: 90 tablet, Rfl: 3   clonazePAM (KLONOPIN) 0.5 MG tablet, Take 1 tablet (0.5 mg total) by mouth daily as needed (will cause drowsiness)., Disp: 90 tablet, Rfl: 1   esomeprazole (NEXIUM) 40 MG capsule, , Disp: , Rfl:    esomeprazole (NEXIUM) 40 MG capsule, TAKE 1 CAPSULE BY MOUTH ONCE A DAY, Disp: 90 capsule, Rfl: 3   hydrOXYzine (ATARAX/VISTARIL) 10 MG tablet, Take 1 tablet (10 mg total) by mouth 2 (two) times daily as needed for anxiety., Disp: 60 tablet, Rfl: 1    levothyroxine (SYNTHROID) 50 MCG tablet, Take 50 mcg by mouth every morning., Disp: , Rfl:      ROS:  Review of Systems  Constitutional:  Negative for fatigue, fever and unexpected weight change.  Respiratory:  Negative for cough, shortness of breath and wheezing.   Cardiovascular:  Negative for chest pain, palpitations and leg swelling.  Gastrointestinal:  Negative for abdominal distention, blood in stool, constipation, diarrhea, nausea and vomiting.  Endocrine: Negative for cold intolerance, heat intolerance and polyuria.  Genitourinary:  Negative for dyspareunia, dysuria, flank pain, frequency, genital sores, hematuria, menstrual problem, pelvic pain, urgency, vaginal bleeding, vaginal discharge and vaginal pain.  Musculoskeletal:  Negative for back pain, joint swelling and myalgias.  Skin:  Negative for rash.  Neurological:  Negative for dizziness, syncope, light-headedness, numbness and headaches.  Hematological:  Negative for adenopathy.  Psychiatric/Behavioral:  Negative for agitation, confusion, sleep disturbance and suicidal ideas. The patient is not nervous/anxious.  BREAST: No symptoms   Objective: BP 120/80   Ht '5\' 3"'  (1.6 m)   Wt 154 lb (69.9 kg)   BMI 27.28 kg/m    Physical Exam Constitutional:      Appearance: She is well-developed.  Genitourinary:     Vulva normal.     Right Labia: No rash, tenderness or lesions.    Left Labia: No tenderness, lesions or rash.    No vaginal discharge, erythema or tenderness.      Right Adnexa: not tender and no mass present.    Left Adnexa: not tender and no mass present.    No cervical friability or polyp.     Uterus is not enlarged or tender.  Breasts:    Right: No mass, nipple discharge, skin change or tenderness.     Left: No mass, nipple discharge, skin change or tenderness.  Neck:     Thyroid: No thyromegaly.  Cardiovascular:     Rate and Rhythm: Normal rate and regular rhythm.     Heart sounds: Normal heart  sounds. No murmur heard. Pulmonary:     Effort: Pulmonary effort is normal.     Breath sounds: Normal breath sounds.  Abdominal:     General: There is no distension.     Palpations: Abdomen is soft.     Tenderness: There is no abdominal tenderness. There is no guarding or rebound.  Musculoskeletal:        General: Normal range of motion.     Cervical back: Normal range of motion.  Lymphadenopathy:     Cervical: No cervical adenopathy.  Neurological:     General: No focal deficit present.     Mental Status: She is alert and oriented to person, place, and time.     Cranial Nerves: No cranial nerve deficit.  Skin:    General: Skin is warm and dry.  Psychiatric:        Mood and Affect: Mood normal.        Behavior: Behavior normal.        Thought Content: Thought content normal.        Judgment: Judgment normal.  Vitals reviewed.    Assessment/Plan:  Encounter for annual routine gynecological examination  Cervical cancer screening - Plan: Cytology - PAP  Screening for HPV (human papillomavirus) - Plan: Cytology - PAP  Cervical high risk human papillomavirus (HPV) DNA test positive - Plan: Cytology - PAP; repeat pap today. Will call with results  Encounter for screening mammogram for malignant neoplasm of breast--pt is current on mammo         GYN counsel breast self exam, mammography screening, menopause, adequate intake of calcium and vitamin D, diet and exercise    F/U  Return in about 1 year (around 01/09/2022).  Cariann Kinnamon B. Clarita Mcelvain, PA-C 01/09/2021 2:02 PM

## 2021-01-11 LAB — CYTOLOGY - PAP
Comment: NEGATIVE
Diagnosis: NEGATIVE
High risk HPV: NEGATIVE

## 2021-01-16 ENCOUNTER — Other Ambulatory Visit (HOSPITAL_COMMUNITY): Payer: Self-pay

## 2021-02-11 ENCOUNTER — Ambulatory Visit (INDEPENDENT_AMBULATORY_CARE_PROVIDER_SITE_OTHER): Payer: No Typology Code available for payment source | Admitting: Cardiology

## 2021-02-11 ENCOUNTER — Other Ambulatory Visit: Payer: Self-pay

## 2021-02-11 ENCOUNTER — Other Ambulatory Visit (HOSPITAL_COMMUNITY): Payer: Self-pay

## 2021-02-11 ENCOUNTER — Encounter: Payer: Self-pay | Admitting: Cardiology

## 2021-02-11 VITALS — BP 116/70 | HR 77 | Ht 63.0 in | Wt 153.6 lb

## 2021-02-11 DIAGNOSIS — I471 Supraventricular tachycardia: Secondary | ICD-10-CM

## 2021-02-11 DIAGNOSIS — R600 Localized edema: Secondary | ICD-10-CM

## 2021-02-11 DIAGNOSIS — E7800 Pure hypercholesterolemia, unspecified: Secondary | ICD-10-CM

## 2021-02-11 MED ORDER — EZETIMIBE 10 MG PO TABS
10.0000 mg | ORAL_TABLET | Freq: Every day | ORAL | 3 refills | Status: DC
  Filled 2021-02-11: qty 30, 30d supply, fill #0

## 2021-02-11 NOTE — Patient Instructions (Signed)
Medication Instructions:   Your physician has recommended you make the following change in your medication:    START taking Zetia 10 MG once a day.  *If you need a refill on your cardiac medications before your next appointment, please call your pharmacy*   Lab Work:  Your physician recommends that you return for a FASTING lipid profile: The week prior to your 3 month follow up.  - You will need to be fasting. Please do not have anything to eat or drink after midnight the morning you have the lab work. You may only have water or black coffee with no cream or sugar.   We will call you closer to your appointment time and schedule you in our office for this lab draw.    Testing/Procedures: None ordered   Follow-Up: At [facility], you and your health needs are our priority.  As part of our continuing mission to provide you with exceptional heart care, we have created designated Provider Care Teams.  These Care Teams include your primary Cardiologist (physician) and Advanced Practice Providers (APPs -  Physician Assistants and Nurse Practitioners) who all work together to provide you with the care you need, when you need it.  We recommend signing up for the patient portal called "MyChart".  Sign up information is provided on this After Visit Summary.  MyChart is used to connect with patients for Virtual Visits (Telemedicine).  Patients are able to view lab/test results, encounter notes, upcoming appointments, etc.  Non-urgent messages can be sent to your provider as well.   To learn more about what you can do with MyChart, go to [email or web address].    Your next appointment:   3 month(s)  The format for your next appointment:   In Person  Provider:   You may see Kate Sable, MD or one of the following Advanced Practice Providers on your designated Care Team:   Murray Hodgkins, NP Christell Faith, PA-C Cadence Kathlen Mody, [place]    Other Instructions

## 2021-02-11 NOTE — Progress Notes (Signed)
Cardiology Office Note:    Date:  02/11/2021   ID:  PETRINA MELBY, DOB 21-Jun-1960, MRN [id number]  PCP:  Doreen Beam, FNP   [facility] Providers Cardiologist:  Kate Sable, MD     Referring MD: Burnard Hawthorne, FNP   No chief complaint on file.  Kristi Carter is a 60 y.o. female who is being seen today for the evaluation of hyperlipidemia at the request of Vidal Schwalbe Yvetta Coder, FNP.   History of Present Illness:    Kristi Carter is a 60 y.o. female with a hx of hyperlipidemia, SVT s/p RFA 2017, anxiety, hypothyroidism who presents due to hyperlipidemia.  Patient has a history of hyperlipidemia ongoing for years now.  Has been on Lipitor 40 mg daily for some time.  Recent blood work showed mild elevations in LFTs.  Lipid panel also showed slightly elevated total and LDL cholesterol.  Work-up for elevated LFTs with abdominal ultrasound being performed by PCP.  She has a history of palpitations, presented to the ED twice in [facility] in [place].  Diagnosed with SVT, required IV medication initially for cardioversion.  Subsequently admitted and underwent RFA.  Has been asymptomatic since ablation of any medications.  Denies chest pain or shortness of breath.  Works from home, sits most of the time has swelling in her legs which gets worse with being on her feet.  Past Medical History:  Diagnosis Date   A-fib ([place])    Allergy    Anxiety    GERD (gastroesophageal reflux disease)    Hypothyroid     Past Surgical History:  Procedure Laterality Date   AUGMENTATION MAMMAPLASTY Bilateral 1990   saline   BRAIN SURGERY     CARDIAC ELECTROPHYSIOLOGY MAPPING AND ABLATION     COLONOSCOPY WITH PROPOFOL N/A 04/13/2020   Procedure: COLONOSCOPY WITH PROPOFOL;  Surgeon: [name], MD;  Location: [facility] ENDOSCOPY;  Service: Gastroenterology;  Laterality: N/A;   ESOPHAGOGASTRODUODENOSCOPY (EGD) WITH PROPOFOL N/A 04/13/2020   Procedure:  ESOPHAGOGASTRODUODENOSCOPY (EGD) WITH PROPOFOL;  Surgeon: [name], MD;  Location: [facility] ENDOSCOPY;  Service: Gastroenterology;  Laterality: N/A;   ESOPHAGOGASTRODUODENOSCOPY (EGD) WITH PROPOFOL N/A 07/16/2020   Procedure: ESOPHAGOGASTRODUODENOSCOPY (EGD) WITH PROPOFOL;  Surgeon: [name], MD;  Location: [facility] ENDOSCOPY;  Service: Gastroenterology;  Laterality: N/A;    Current Medications: Current Meds  Medication Sig   atorvastatin (LIPITOR) 40 MG tablet TAKE 1 TABLET BY MOUTH ONCE DAILY   clonazePAM (KLONOPIN) 0.5 MG tablet Take 1 tablet (0.5 mg total) by mouth daily as needed (will cause drowsiness).   esomeprazole (NEXIUM) 40 MG capsule TAKE 1 CAPSULE BY MOUTH ONCE A DAY   ezetimibe (ZETIA) 10 MG tablet Take 1 tablet (10 mg total) by mouth daily.   hydrOXYzine (ATARAX/VISTARIL) 10 MG tablet Take 1 tablet (10 mg total) by mouth 2 (two) times daily as needed for anxiety.   levothyroxine (SYNTHROID) 50 MCG tablet Take 50 mcg by mouth every morning.     Allergies:   Other, Prednisone, and Tape   Social History   Socioeconomic History   Marital status: Married    Spouse name: Not on file   Number of children: Not on file   Years of education: Not on file   Highest education level: Not on file  Occupational History   Not on file  Tobacco Use   Smoking status: Never   Smokeless tobacco: Never  Vaping Use   Vaping Use: Never used  Substance and Sexual Activity  Alcohol use: Yes    Alcohol/week: 10.0 standard drinks    Types: 10 Glasses of wine per week   Drug use: Never   Sexual activity: Not Currently    Birth control/protection: Post-menopausal  Other Topics Concern   Not on file  Social History Narrative   Not on file   Social Determinants of Health   Financial Resource Strain: Not on file  Food Insecurity: Not on file  Transportation Needs: Not on file  Physical Activity: Not on file  Stress: Not on file  Social Connections: Not on file     Family  History: The patient's family history includes Cancer in her paternal uncle; Healthy in her father; Heart attack in her paternal grandfather; Stroke in her paternal grandmother.  ROS:   Please see the history of present illness.     All other systems reviewed and are negative.  EKGs/Labs/Other Studies Reviewed:    The following studies were reviewed today:   EKG:  EKG is  ordered today.  The ekg ordered today demonstrates normal sinus rhythm, normal ECG  Recent Labs: 12/17/2020: ALT 29; BUN 8; Creatinine, Ser 0.46; Hemoglobin 14.1; Platelets 262.0; Potassium 3.8; Sodium 136; TSH 1.60  Recent Lipid Panel    Component Value Date/Time   CHOL 212 (H) 12/17/2020 1446   CHOL 206 (H) 01/03/2020 1441   TRIG 97.0 12/17/2020 1446   HDL 85.40 12/17/2020 1446   HDL 70 01/03/2020 1441   CHOLHDL 2 12/17/2020 1446   VLDL 19.4 12/17/2020 1446   LDLCALC 107 (H) 12/17/2020 1446   LDLCALC 111 (H) 01/03/2020 1441     Risk Assessment/Calculations:          Physical Exam:    VS:  BP 116/70   Pulse 77   Ht 5\' 3"  (1.6 m)   Wt 153 lb 9.6 oz (69.7 kg)   SpO2 96%   BMI 27.21 kg/m     Wt Readings from Last 3 Encounters:  02/11/21 153 lb 9.6 oz (69.7 kg)  01/09/21 154 lb (69.9 kg)  12/17/20 154 lb 9.6 oz (70.1 kg)     GEN:  Well nourished, well developed in no acute distress HEENT: Normal NECK: No JVD; No carotid bruits LYMPHATICS: No lymphadenopathy CARDIAC: RRR, no murmurs, rubs, gallops RESPIRATORY:  Clear to auscultation without rales, wheezing or rhonchi  ABDOMEN: Soft, non-tender, non-distended MUSCULOSKELETAL:  No edema; No deformity  SKIN: Warm and dry NEUROLOGIC:  Alert and oriented x 3 PSYCHIATRIC:  Normal affect   ASSESSMENT:    1. Pure hypercholesterolemia   2. SVT (supraventricular tachycardia) (HCC)   3. Leg edema    PLAN:    In order of problems listed above:  Hyperlipidemia, start Zetia 10 mg daily, continue Lipitor 40.  Avoiding increasing statin due to  the elevated LFTs.  Recheck lipid panel in 3 months. History of SVT s/p RFA 2017.  No further symptoms.  Continue to monitor. Leg edema, appears dependent, no edema on my exam. No cardiac symptoms. Leg raise while in seated position advised.  Compression stockings advised.    Follow-up in 3 months after repeat fasting lipid profile     Medication Adjustments/Labs and Tests Ordered: Current medicines are reviewed at length with the patient today.  Concerns regarding medicines are outlined above.  Orders Placed This Encounter  Procedures   Lipid panel   EKG 12-Lead   Meds ordered this encounter  Medications   ezetimibe (ZETIA) 10 MG tablet    Sig: Take 1 tablet (  10 mg total) by mouth daily.    Dispense:  30 tablet    Refill:  3    Patient Instructions  Medication Instructions:   Your physician has recommended you make the following change in your medication:    START taking Zetia 10 MG once a day.  *If you need a refill on your cardiac medications before your next appointment, please call your pharmacy*   Lab Work:  Your physician recommends that you return for a FASTING lipid profile: The week prior to your 3 month follow up.  - You will need to be fasting. Please do not have anything to eat or drink after midnight the morning you have the lab work. You may only have water or black coffee with no cream or sugar.   We will call you closer to your appointment time and schedule you in our office for this lab draw.    Testing/Procedures: None ordered   Follow-Up: At [facility], you and your health needs are our priority.  As part of our continuing mission to provide you with exceptional heart care, we have created designated Provider Care Teams.  These Care Teams include your primary Cardiologist (physician) and Advanced Practice Providers (APPs -  Physician Assistants and Nurse Practitioners) who all work together to provide you with the care you need, when you need  it.  We recommend signing up for the patient portal called "MyChart".  Sign up information is provided on this After Visit Summary.  MyChart is used to connect with patients for Virtual Visits (Telemedicine).  Patients are able to view lab/test results, encounter notes, upcoming appointments, etc.  Non-urgent messages can be sent to your provider as well.   To learn more about what you can do with MyChart, go to [email or web address].    Your next appointment:   3 month(s)  The format for your next appointment:   In Person  Provider:   You may see Kate Sable, MD or one of the following Advanced Practice Providers on your designated Care Team:   Murray Hodgkins, NP Christell Faith, PA-C Cadence Kathlen Mody, [place]    Other Instructions    Signed, Kate Sable, MD  02/11/2021 4:14 PM    [place]

## 2021-02-18 ENCOUNTER — Other Ambulatory Visit (HOSPITAL_COMMUNITY): Payer: Self-pay

## 2021-03-21 ENCOUNTER — Other Ambulatory Visit (HOSPITAL_COMMUNITY): Payer: Self-pay

## 2021-03-21 ENCOUNTER — Telehealth: Payer: No Typology Code available for payment source | Admitting: Emergency Medicine

## 2021-03-21 ENCOUNTER — Other Ambulatory Visit: Payer: Self-pay

## 2021-03-21 DIAGNOSIS — R6889 Other general symptoms and signs: Secondary | ICD-10-CM

## 2021-03-21 MED ORDER — CARESTART COVID-19 HOME TEST VI KIT
PACK | 0 refills | Status: DC
  Filled 2021-03-21: qty 2, 4d supply, fill #0

## 2021-03-21 MED ORDER — PAXLOVID (300/100) 20 X 150 MG & 10 X 100MG PO TBPK
ORAL_TABLET | ORAL | 0 refills | Status: DC
  Filled 2021-03-21: qty 30, 5d supply, fill #0

## 2021-03-21 MED ORDER — OSELTAMIVIR PHOSPHATE 75 MG PO CAPS
75.0000 mg | ORAL_CAPSULE | Freq: Two times a day (BID) | ORAL | 0 refills | Status: DC
  Filled 2021-03-21 (×2): qty 10, 5d supply, fill #0

## 2021-03-21 NOTE — Progress Notes (Signed)
E visit for Flu like symptoms   We are sorry that you are not feeling well.  Here is how we plan to help! Based on what you have shared with me it looks like you may have a respiratory virus that may be influenza.  Influenza or "the flu" is   an infection caused by a respiratory virus. The flu virus is highly contagious and persons who did not receive their yearly flu vaccination may "catch" the flu from close contact.  We have anti-viral medications to treat the viruses that cause this infection. They are not a "cure" and only shorten the course of the infection. These prescriptions are most effective when they are given within the first 2 days of "flu" symptoms. Antiviral medication are indicated if you have a high risk of complications from the flu. You should  also consider an antiviral medication if you are in close contact with someone who is at risk. These medications can help patients avoid complications from the flu  but have side effects that you should know. Possible side effects from Tamiflu or oseltamivir include nausea, vomiting, diarrhea, dizziness, headaches, eye redness, sleep problems or other respiratory symptoms. You should not take Tamiflu if you have an allergy to oseltamivir or any to the ingredients in Tamiflu.  Based upon your symptoms and potential risk factors I have prescribed Oseltamivir (Tamiflu).  It has been sent to your designated pharmacy.  You will take one 75 mg capsule orally twice a day for the next 5 days.  ANYONE WHO HAS FLU SYMPTOMS SHOULD: Stay home. The flu is highly contagious and going out or to work exposes others! Be sure to drink plenty of fluids. Water is fine as well as fruit juices, sodas and electrolyte beverages. You may want to stay away from caffeine or alcohol. If you are nauseated, try taking small sips of liquids. How do you know if you are getting enough fluid? Your urine should be a pale yellow or almost colorless. Get rest. Taking a steamy  shower or using a humidifier may help nasal congestion and ease sore throat pain. Using a saline nasal spray works much the same way. Cough drops, hard candies and sore throat lozenges may ease your cough. Line up a caregiver. Have someone check on you regularly.   GET HELP RIGHT AWAY IF: You cannot keep down liquids or your medications. You become short of breath Your fell like you are going to pass out or loose consciousness. Your symptoms persist after you have completed your treatment plan MAKE SURE YOU  Understand these instructions. Will watch your condition. Will get help right away if you are not doing well or get worse.  Your e-visit answers were reviewed by a board certified advanced clinical practitioner to complete your personal care plan.  Depending on the condition, your plan could have included both over the counter or prescription medications.  If there is a problem please reply  once you have received a response from your provider.  Your safety is important to us.  If you have drug allergies check your prescription carefully.    You can use MyChart to ask questions about today's visit, request a non-urgent call back, or ask for a work or school excuse for 24 hours related to this e-Visit. If it has been greater than 24 hours you will need to follow up with your provider, or enter a new e-Visit to address those concerns.  You will get an e-mail in the next   two days asking about your experience.  I hope that your e-visit has been valuable and will speed your recovery. Thank you for using e-visits.   Approximately 5 minutes was used in reviewing the patient's chart, questionnaire, prescribing medications, and documentation.  

## 2021-03-22 ENCOUNTER — Other Ambulatory Visit: Payer: Self-pay

## 2021-03-22 ENCOUNTER — Telehealth: Payer: Self-pay | Admitting: Pharmacist

## 2021-03-22 NOTE — Telephone Encounter (Signed)
Outpatient Pharmacy Oral COVID Treatment Note  I connected with Kristi Carter on 03/22/2021/8:39 AM by telephone and verified that I am speaking with the correct person using two identifiers.  I discussed the limitations, risks, security, and privacy concerns of performing an evaluation and management service by telephone and the availability of in person appointments via referral to a physician. The patient expressed understanding and agreed to proceed.  Pharmacy location: [facility]  Diagnosis: COVID-19 infection  Purpose of visit: Discussion of potential use of Paxlovid, a new treatment for mild to moderate COVID-19 viral infection in non-hospitalized patients.  Subjective/Objective: Patient is a 61 y.o. female who is presenting with COVID 19 viral infection.  COVID 19 viral infection. Their symptoms began on 03/19/21 with congestion, sore throat, fever.  The patient has confirmed COVID-19 via a home test on 03/21/21.   Past Medical History:  Diagnosis Date   A-fib (HCC)    Allergy    Anxiety    GERD (gastroesophageal reflux disease)    Hypothyroid      Allergies  Allergen Reactions   Other     Cats, Mold and Feathers   Prednisone     Other reaction(s): Vomiting   Tape      Current Outpatient Medications:    atorvastatin (LIPITOR) 40 MG tablet, TAKE 1 TABLET BY MOUTH ONCE DAILY, Disp: 90 tablet, Rfl: 3   clonazePAM (KLONOPIN) 0.5 MG tablet, Take 1 tablet (0.5 mg total) by mouth daily as needed (will cause drowsiness)., Disp: 90 tablet, Rfl: 1   COVID-19 At Home Antigen Test (CARESTART COVID-19 HOME TEST) KIT, test as directed, Disp: 2 kit, Rfl: 0   esomeprazole (NEXIUM) 40 MG capsule, TAKE 1 CAPSULE BY MOUTH ONCE A DAY, Disp: 90 capsule, Rfl: 3   ezetimibe (ZETIA) 10 MG tablet, Take 1 tablet (10 mg total) by mouth daily., Disp: 30 tablet, Rfl: 3   hydrOXYzine (ATARAX/VISTARIL) 10 MG tablet, Take 1 tablet (10 mg total) by mouth 2 (two) times daily as needed for  anxiety., Disp: 60 tablet, Rfl: 1   levothyroxine (SYNTHROID) 50 MCG tablet, Take 50 mcg by mouth every morning., Disp: , Rfl:    levothyroxine (SYNTHROID) 50 MCG tablet, Take 1 tablet by mouth in the morning on an empty stomach daily (Patient not taking: Reported on 12/17/2020), Disp: 90 tablet, Rfl: 4   nirmatrelvir & ritonavir (PAXLOVID, 300/100,) 20 x 150 MG & 10 x 100MG TBPK, Take 3 tablets by mouth twice a day for 5 days, Disp: 30 tablet, Rfl: 0   oseltamivir (TAMIFLU) 75 MG capsule, Take 1 capsule (75 mg total) by mouth 2 (two) times daily., Disp: 10 capsule, Rfl: 0  Lab Monitoring: eGFR 105  Drug Interactions Noted: Medications reviewed and counseled to stop atorvastatin for 10 days. Patient was counseled to monitor for increased side effects of clonazepam and reduce dose to 1/2 tablet if needed. Patient did not test positive for the flu and was instructed to stop oseltamivir.  Plan:  This patient is a 61 y.o. female that meets the criteria for Emergency Use Authorization of Paxlovid. After reviewing the emergency use authorization with the patient, the patient agrees to receive Paxlovid.  Through FDA guidance and current [facility] standing order Paxlovid will be prescribed to the patient.   Patient contacted for counseling on 03/21/21 and verbalized understanding.   Delivery or Pick-Up Date: 03/22/21.  Follow up instructions:    Take prescription BID x 5 days as directed Counseling was provided by  pharmacist. Reach out to pharmacist with follow up questions For concerns regarding further COVID symptoms please follow up with your PCP or urgent care For urgent or life-threatening issues, seek care at your local emergency department   Letta Median 03/22/2021, 8:39 AM [place] Pharmacist Phone# [phone]

## 2021-04-04 ENCOUNTER — Other Ambulatory Visit: Payer: Self-pay | Admitting: Adult Health

## 2021-04-04 ENCOUNTER — Telehealth: Payer: No Typology Code available for payment source | Admitting: Family Medicine

## 2021-04-04 ENCOUNTER — Other Ambulatory Visit (HOSPITAL_COMMUNITY): Payer: Self-pay

## 2021-04-04 ENCOUNTER — Other Ambulatory Visit: Payer: Self-pay

## 2021-04-04 DIAGNOSIS — J019 Acute sinusitis, unspecified: Secondary | ICD-10-CM

## 2021-04-04 DIAGNOSIS — R052 Subacute cough: Secondary | ICD-10-CM | POA: Diagnosis not present

## 2021-04-04 DIAGNOSIS — B9689 Other specified bacterial agents as the cause of diseases classified elsewhere: Secondary | ICD-10-CM | POA: Diagnosis not present

## 2021-04-04 MED ORDER — PROMETHAZINE-DM 6.25-15 MG/5ML PO SYRP
2.5000 mL | ORAL_SOLUTION | Freq: Two times a day (BID) | ORAL | 0 refills | Status: DC | PRN
  Filled 2021-04-04: qty 118, 24d supply, fill #0

## 2021-04-04 MED ORDER — BENZONATATE 100 MG PO CAPS
100.0000 mg | ORAL_CAPSULE | Freq: Two times a day (BID) | ORAL | 0 refills | Status: DC | PRN
  Filled 2021-04-04: qty 20, 5d supply, fill #0

## 2021-04-04 MED ORDER — FLUCONAZOLE 150 MG PO TABS
150.0000 mg | ORAL_TABLET | Freq: Every day | ORAL | 0 refills | Status: DC
  Filled 2021-04-04: qty 2, 2d supply, fill #0

## 2021-04-04 MED ORDER — AMOXICILLIN-POT CLAVULANATE 875-125 MG PO TABS
1.0000 | ORAL_TABLET | Freq: Two times a day (BID) | ORAL | 0 refills | Status: AC
  Filled 2021-04-04: qty 14, 7d supply, fill #0

## 2021-04-04 NOTE — Progress Notes (Signed)
E-Visit for Sinus Problems  We are sorry that you are not feeling well.  Here is how we plan to help!  Based on what you have shared with me it looks like you have sinusitis.  Sinusitis is inflammation and infection in the sinus cavities of the head.  Based on your presentation I believe you most likely have Acute Bacterial Sinusitis.  This is an infection caused by bacteria and is treated with antibiotics. I have prescribed Augmentin 875mg /125mg  one tablet twice daily with food, for 7 days. You may use an oral decongestant such as Mucinex D or if you have glaucoma or high blood pressure use plain Mucinex. Saline nasal spray help and can safely be used as often as needed for congestion.  If you develop worsening sinus pain, fever or notice severe headache and vision changes, or if symptoms are not better after completion of antibiotic, please schedule an appointment with a health care provider.    Sinus infections are not as easily transmitted as other respiratory infection, however we still recommend that you avoid close contact with loved ones, especially the very young and elderly.  Remember to wash your hands thoroughly throughout the day as this is the number one way to prevent the spread of infection!  And tessalon perles and cough syrup for you to take as well.   Home Care: Only take medications as instructed by your medical team. Complete the entire course of an antibiotic. Do not take these medications with alcohol. A steam or ultrasonic humidifier can help congestion.  You can place a towel over your head and breathe in the steam from hot water coming from a faucet. Avoid close contacts especially the very young and the elderly. Cover your mouth when you cough or sneeze. Always remember to wash your hands.  Get Help Right Away If: You develop worsening fever or sinus pain. You develop a severe head ache or visual changes. Your symptoms persist after you have completed your treatment  plan.  Make sure you Understand these instructions. Will watch your condition. Will get help right away if you are not doing well or get worse.  Thank you for choosing an e-visit.  Your e-visit answers were reviewed by a board certified advanced clinical practitioner to complete your personal care plan. Depending upon the condition, your plan could have included both over the counter or prescription medications.  Please review your pharmacy choice. Make sure the pharmacy is open so you can pick up prescription now. If there is a problem, you may contact your provider through [employer] and have the prescription routed to another pharmacy.  Your safety is important to [place]. If you have drug allergies check your prescription carefully.   For the next 24 hours you can use MyChart to ask questions about today's visit, request a non-urgent call back, or ask for a work or school excuse. You will get an email in the next two days asking about your experience. I hope that your e-visit has been valuable and will speed your recovery.  I provided 5 minutes of non face-to-face time during this encounter for chart review, medication and order placement, as well as and documentation.

## 2021-04-04 NOTE — Addendum Note (Signed)
Addended by: Perlie Mayo on: 04/04/2021 11:52 AM   Modules accepted: Orders

## 2021-04-08 ENCOUNTER — Other Ambulatory Visit (HOSPITAL_COMMUNITY): Payer: Self-pay

## 2021-04-11 ENCOUNTER — Encounter: Payer: Self-pay | Admitting: Adult Health

## 2021-04-15 ENCOUNTER — Other Ambulatory Visit: Payer: Self-pay | Admitting: Adult Health

## 2021-04-15 ENCOUNTER — Other Ambulatory Visit (HOSPITAL_COMMUNITY): Payer: Self-pay

## 2021-04-15 MED ORDER — CLONAZEPAM 0.5 MG PO TABS
0.5000 mg | ORAL_TABLET | Freq: Every day | ORAL | 0 refills | Status: DC | PRN
  Filled 2021-04-15: qty 60, 60d supply, fill #0

## 2021-04-15 MED ORDER — IBUPROFEN 800 MG PO TABS
800.0000 mg | ORAL_TABLET | Freq: Every day | ORAL | 0 refills | Status: DC | PRN
  Filled 2021-04-15: qty 90, 90d supply, fill #0

## 2021-04-15 NOTE — Telephone Encounter (Signed)
Office follow up advised for further refills.courtesy given of Klonopin  60 tablets given.

## 2021-04-15 NOTE — Telephone Encounter (Signed)
Please see if patient is still taking and if so be sure is just PRN and no other NSAID'S. It looks as if may have been filled already ?

## 2021-04-15 NOTE — Telephone Encounter (Signed)
Called pt to inform that Klonopin has been refilled but will need f/u appt for further refills. Also to see if ibuprofen is taking as needed and is the only nsaid's she is taking. LVM for pt to return call.

## 2021-04-15 NOTE — Telephone Encounter (Signed)
Patient called about the status of MyChart note.

## 2021-04-18 ENCOUNTER — Other Ambulatory Visit: Payer: Self-pay | Admitting: Adult Health

## 2021-04-18 ENCOUNTER — Other Ambulatory Visit (HOSPITAL_COMMUNITY): Payer: Self-pay

## 2021-04-22 ENCOUNTER — Other Ambulatory Visit (HOSPITAL_COMMUNITY): Payer: Self-pay

## 2021-04-30 ENCOUNTER — Telehealth: Payer: Self-pay | Admitting: Adult Health

## 2021-04-30 ENCOUNTER — Other Ambulatory Visit (HOSPITAL_COMMUNITY): Payer: Self-pay

## 2021-05-01 ENCOUNTER — Other Ambulatory Visit (HOSPITAL_COMMUNITY): Payer: Self-pay

## 2021-05-02 NOTE — Telephone Encounter (Signed)
Pt called in about refill request for atorvastatin Dr Claudina Lick we know isn at practice anymore, but can she have this refill anyway silnce she is still at this practice, or doe she need to schedule an appt? Please call back.

## 2021-05-03 ENCOUNTER — Other Ambulatory Visit (HOSPITAL_COMMUNITY): Payer: Self-pay

## 2021-05-03 ENCOUNTER — Other Ambulatory Visit: Payer: Self-pay | Admitting: Adult Health

## 2021-05-06 ENCOUNTER — Other Ambulatory Visit (HOSPITAL_COMMUNITY): Payer: Self-pay

## 2021-05-06 MED ORDER — ATORVASTATIN CALCIUM 40 MG PO TABS
ORAL_TABLET | Freq: Every day | ORAL | 0 refills | Status: DC
  Filled 2021-05-06: qty 90, 90d supply, fill #0

## 2021-05-06 NOTE — Telephone Encounter (Signed)
Courtesy refill sent needs to be seen at [facility] or schedule at [facility] to establish.  Initial refill was refused by [facility].

## 2021-05-09 ENCOUNTER — Encounter: Payer: Self-pay | Admitting: Adult Health

## 2021-05-09 DIAGNOSIS — E039 Hypothyroidism, unspecified: Secondary | ICD-10-CM

## 2021-05-17 ENCOUNTER — Ambulatory Visit: Payer: No Typology Code available for payment source | Admitting: Cardiology

## 2021-05-28 ENCOUNTER — Encounter: Payer: Self-pay | Admitting: Adult Health

## 2021-06-28 ENCOUNTER — Telehealth: Payer: No Typology Code available for payment source | Admitting: Nurse Practitioner

## 2021-06-28 DIAGNOSIS — H9202 Otalgia, left ear: Secondary | ICD-10-CM

## 2021-06-29 MED ORDER — HYDROCORTISONE-ACETIC ACID 1-2 % OT SOLN: OTIC | 0 refills | Status: DC

## 2021-06-29 NOTE — Progress Notes (Signed)
?  We are sorry that you are not feeling well. Here is how we plan to help! ? ?Based on what you have shared with me it looks like you might not have swimmers ear. Swimmer's ear is a redness or swelling, irritation, or infection of your outer ear canal.  This is a very painful condition that is often accompanied Itching inside the ear, Redness or a sense of swelling in the ear, Pain when the ear is tugged on when pressure is placed on the ear, Pus draining from the infected ear. ? ?At this time I would just recommend hydrocortisone drops for the ear. ? ?Your symptoms should improve over the next 3 days and if not then I would suggest an urgent care for further evaluation and examination of the ear.   ? ?HOME CARE: ? ?Wash your hands frequently. ?Do not place the tip of the bottle on your ear or touch it with your fingers. ?You can take Acetominophen 650 mg every 4-6 hours or Continue ibuprofen as you have been doing as needed for pain.  If pain is severe or moderate, you can apply a heating pad (set on low) or hot water bottle (wrapped in a towel) to outer ear for 20 minutes.  This will also increase drainage. ?Avoid ear plugs ?Do not use Q-tips ?After showers, help the water run out by tilting your head to one side. ? ?GET HELP RIGHT AWAY IF: ? ?Fever is over 102.2 degrees. ?You develop progressive ear pain or hearing loss. ?Ear symptoms persist longer than 3 days after treatment. ? ?MAKE SURE YOU: ? ?Understand these instructions. ?Will watch your condition. ?Will get help right away if you are not doing well or get worse. ? ?TO PREVENT SWIMMER'S EAR: ?Use a bathing cap or custom fitted swim molds to keep your ears dry. ?Towel off after swimming to dry your ears. ?Tilt your head or pull your earlobes to allow the water to escape your ear canal. ?If there is still water in your ears, consider using a hairdryer on the lowest setting. ? ? ?Thank you for choosing an e-visit. ? ?Your e-visit answers were reviewed by a  board certified advanced clinical practitioner to complete your personal care plan. Depending upon the condition, your plan could have included both over the counter or prescription medications. ? ?Please review your pharmacy choice. Make sure the pharmacy is open so you can pick up prescription now. If there is a problem, you may contact your provider through [employer] and have the prescription routed to another pharmacy.  Your safety is important to [place]. If you have drug allergies check your prescription carefully.  ? ?For the next 24 hours you can use MyChart to ask questions about today's visit, request a non-urgent call back, or ask for a work or school excuse. ?You will get an email in the next two days asking about your experience. I hope that your e-visit has been valuable and will speed your recovery. ? ?  ?

## 2021-06-29 NOTE — Progress Notes (Signed)
I have spent 5 minutes in review of e-visit questionnaire, review and updating patient chart, medical decision making and response to patient.  ° °Kristi Carter W Arzella Rehmann, NP ° °  °

## 2021-07-02 ENCOUNTER — Encounter: Payer: Self-pay | Admitting: Obstetrics and Gynecology

## 2021-07-02 ENCOUNTER — Telehealth: Payer: No Typology Code available for payment source | Admitting: Physician Assistant

## 2021-07-02 DIAGNOSIS — R300 Dysuria: Secondary | ICD-10-CM

## 2021-07-03 ENCOUNTER — Telehealth: Payer: No Typology Code available for payment source | Admitting: Physician Assistant

## 2021-07-03 DIAGNOSIS — N3090 Cystitis, unspecified without hematuria: Secondary | ICD-10-CM

## 2021-07-03 MED ORDER — CEPHALEXIN 500 MG PO CAPS: 500.0000 mg | ORAL_CAPSULE | Freq: Two times a day (BID) | ORAL | 0 refills | Status: AC

## 2021-07-03 MED ORDER — FLUCONAZOLE 150 MG PO TABS: 150.0000 mg | ORAL_TABLET | Freq: Once | ORAL | 0 refills | Status: AC

## 2021-07-03 NOTE — Progress Notes (Signed)
Duplicate visit. ? ?-------------------------------------------------------------------------------------------------------------- ? ?NOTE: There will be NO CHARGE for this eVisit ?  ?If you are having a true medical emergency please call 911.   ?  ? For an urgent face to face visit, [facility] has six urgent care centers for your convenience:  ?  ? [facility] ?Get Driving Directions ?[phone] ?[address] ?[place], [place] [address] ?  ? [facility]) ?Get Driving Directions ?[phone] ?[address] ?[place], [place] [address] ? ?[facility] ([place]) ?Get Driving Directions ?[address] [place],  [place]  [address] ? ?[facility] ?Get Driving Directions ?[phone] ?[address] ?[place], [place] [address] ?  ?[facility] [place] ?Get Driving Directions  ?[phone] ?[address] ?[address] ?[place], [place] [address] ?  ?[facility] at [facility] ?Get Driving Directions ?[phone] ?[address] ?[place], [place] [address] ? ?Your MyChart E-visit questionnaire answers were reviewed by a board certified advanced clinical practitioner to complete your personal care plan based on your specific symptoms.  Thank you for using e-Visits. ?  ? ?

## 2021-07-03 NOTE — Progress Notes (Signed)
E-Visit for Urinary Problems ? ?We are sorry that you are not feeling well.  Here is how we plan to help! ? ?Based on what you shared with me it looks like you most likely have a simple urinary tract infection. ? ?A UTI (Urinary Tract Infection) is a bacterial infection of the bladder. ? ?Most cases of urinary tract infections are simple to treat but a key part of your care is to encourage you to drink plenty of fluids and watch your symptoms carefully. ? ?I have prescribed Keflex 500 mg twice a day for 7 days.  Your symptoms should gradually improve. Call us if the burning in your urine worsens, you develop worsening fever, back pain or pelvic pain or if your symptoms do not resolve after completing the antibiotic. ? ?I have also prescribed diflucan if you experience symptoms of a yeast infection ? ?Urinary tract infections can be prevented by drinking plenty of water to keep your body hydrated.  Also be sure when you wipe, wipe from front to back and don't hold it in!  If possible, empty your bladder every 4 hours. ? ?HOME CARE ?Drink plenty of fluids ?Compete the full course of the antibiotics even if the symptoms resolve ?Remember, when you need to go?go. Holding in your urine can increase the likelihood of getting a UTI! ?GET HELP RIGHT AWAY IF: ?You cannot urinate ?You get a high fever ?Worsening back pain occurs ?You see blood in your urine ?You feel sick to your stomach or throw up ?You feel like you are going to pass out ? ?MAKE SURE YOU  ?Understand these instructions. ?Will watch your condition. ?Will get help right away if you are not doing well or get worse. ? ? ?Thank you for choosing an e-visit. ? ?Your e-visit answers were reviewed by a board certified advanced clinical practitioner to complete your personal care plan. Depending upon the condition, your plan could have included both over the counter or prescription medications. ? ?Please review your pharmacy choice. Make sure the pharmacy is open so  you can pick up prescription now. If there is a problem, you may contact your provider through [employer] and have the prescription routed to another pharmacy.  Your safety is important to [place]. If you have drug allergies check your prescription carefully.  ? ?For the next 24 hours you can use MyChart to ask questions about today's visit, request a non-urgent call back, or ask for a work or school excuse. ?You will get an email in the next two days asking about your experience. I hope that your e-visit has been valuable and will speed your recovery. ? ?Approximately 5 minutes was spent documenting and reviewing patient's chart. ? ?

## 2021-07-05 ENCOUNTER — Encounter: Payer: Self-pay | Admitting: Emergency Medicine

## 2021-07-05 ENCOUNTER — Encounter: Payer: Self-pay | Admitting: Family

## 2021-07-05 ENCOUNTER — Ambulatory Visit
Admission: EM | Admit: 2021-07-05 | Discharge: 2021-07-05 | Disposition: A | Discharge status: Home / Self Care | Payer: No Typology Code available for payment source | Attending: Family Medicine | Admitting: Family Medicine

## 2021-07-05 ENCOUNTER — Other Ambulatory Visit: Payer: Self-pay

## 2021-07-05 DIAGNOSIS — H6692 Otitis media, unspecified, left ear: Secondary | ICD-10-CM | POA: Diagnosis not present

## 2021-07-05 DIAGNOSIS — H6123 Impacted cerumen, bilateral: Secondary | ICD-10-CM | POA: Diagnosis not present

## 2021-07-05 MED ORDER — FLUCONAZOLE 150 MG PO TABS
150.0000 mg | ORAL_TABLET | Freq: Once | ORAL | 0 refills | Status: AC
  Filled 2021-07-05: qty 1, 1d supply, fill #0

## 2021-07-05 MED ORDER — OFLOXACIN 0.3 % OT SOLN
3.0000 [drp] | Freq: Two times a day (BID) | OTIC | 0 refills | Status: DC
  Filled 2021-07-05: qty 10, 25d supply, fill #0

## 2021-07-05 NOTE — Discharge Instructions (Addendum)
I have placed a referral to ENT. They will contact you to schedule an appointment. ?Continue Keflex this will help with ear infection. I have prescribed ear antibiotics Ofloxacin twice daily for 7 days for left ear pain. ? ?

## 2021-07-05 NOTE — ED Triage Notes (Signed)
Pt here with bilateral ear fullness with it being worse on left side x 1 week.  ?

## 2021-07-05 NOTE — Telephone Encounter (Signed)
Spoke to patient about her not being able to hear out of her left ear patient stated that she is going to  go to urgent care today but she would like a referral to an ENT in place just in case urgent care is unable to help. ?

## 2021-07-05 NOTE — ED Provider Notes (Signed)
?[facility] ? ? ? ?CSN: [id number] ?Arrival date & time: 07/05/21  1133 ? ? ?  ? ?History   ?Chief Complaint ?Chief Complaint  ?Patient presents with  ? Ear Fullness  ? ? ?HPI ?Kristi Carter is a 61 y.o. female.  ? ?HPI ?Patient presents today for evaluation of bilateral ear fullness and inability to hear.  She endorses some mild tenderness in the left ear. She denies any other upper respiratory symptoms.  She has been using a prescription hydrocortisone otic solution without any improvement of symptoms.  ?Past Medical History:  ?Diagnosis Date  ? A-fib ([place])   ? Allergy   ? Anxiety   ? GERD (gastroesophageal reflux disease)   ? Hypothyroid   ? ? ?Patient Active Problem List  ? Diagnosis Date Noted  ? Cervical high risk human papillomavirus (HPV) DNA test positive 01/09/2021  ? Elevated liver enzymes 12/26/2020  ? History of Barrett's esophagus 03/13/2020  ? Body mass index 28.0-28.9, adult 01/06/2020  ? Intestinal polyp 01/03/2020  ? Skin lesion 01/03/2020  ? Large breasts 08/26/2019  ? Back pain 08/26/2019  ? History of UTI 07/27/2019  ? Blood dyscrasia- IFE1 positive- multiple myeloma panel mild abnormalities  07/20/2019  ? Antibiotic-induced yeast infection 07/20/2019  ? Vitamin D deficiency 07/20/2019  ? Routine physical examination 07/15/2019  ? Bladder wall thickening 07/15/2019  ? Bone lesion 07/15/2019  ? Blood in stool 07/15/2019  ? Recurrent UTI (urinary tract infection) 07/15/2019  ? Abdominal bloating 07/15/2019  ? Other fatigue 07/15/2019  ? Cyst of right ovary- dermoid seen on CT 07/09/2019  07/15/2019  ? History of bilateral breast implants 07/15/2019  ? GERD without esophagitis 06/14/2018  ? Hypothyroid 06/14/2018  ? Anxiety disorder 10/20/2014  ? Hyperlipidemia 10/20/2014  ? Nonulcer dyspepsia 10/20/2014  ? ? ?Past Surgical History:  ?Procedure Laterality Date  ? AUGMENTATION MAMMAPLASTY Bilateral 1990  ? saline  ? BRAIN SURGERY    ? CARDIAC ELECTROPHYSIOLOGY MAPPING AND ABLATION    ?  COLONOSCOPY WITH PROPOFOL N/A 04/13/2020  ? Procedure: COLONOSCOPY WITH PROPOFOL;  Surgeon: [name], MD;  Location[facility] ENDOSCOPY;  Service: Gastroenterology;  Laterality: N/A;  ? ESOPHAGOGASTRODUODENOSCOPY (EGD) WITH PROPOFOL N/A 04/13/2020  ? Procedure: ESOPHAGOGASTRODUODENOSCOPY (EGD) WITH PROPOFOL;  Surgeon: [name], MD;  Location[facility] ENDOSCOPY;  Service: Gastroenterology;  Laterality: N/A;  ? ESOPHAGOGASTRODUODENOSCOPY (EGD) WITH PROPOFOL N/A 07/16/2020  ? Procedure: ESOPHAGOGASTRODUODENOSCOPY (EGD) WITH PROPOFOL;  Surgeon: [name], MD;  Location[facility] ENDOSCOPY;  Service: Gastroenterology;  Laterality: N/A;  ? ? ?OB History   ? ? Gravida  ?2  ? Para  ?2  ? Term  ?2  ? Preterm  ?   ? AB  ?   ? Living  ?2  ?  ? ? SAB  ?   ? IAB  ?   ? Ectopic  ?   ? Multiple  ?   ? Live Births  ?2  ?   ?  ?  ? ? ? ?Home Medications   ? ?Prior to Admission medications   ?Medication Sig Start Date End Date Taking? Authorizing Provider  ?fluconazole (DIFLUCAN) 150 MG tablet Take 1 tablet (150 mg total) by mouth once for 1 dose. Repeat if needed 07/05/21 07/06/21 Yes Scot Jun, FNP  ?ofloxacin (FLOXIN) 0.3 % OTIC solution Place 3 drops into the left ear 2 (two) times daily for 7 days. 07/05/21 07/30/21 Yes Scot Jun, FNP  ?acetic acid-hydrocortisone (VOSOL-HC) OTIC solution Insert saturated wick of  cotton into left ear; keep moist for 24 hours by adding 3 to 5 drops every 4 to 6 hours; remove wick after 24 hours and instill 5 drops 3 to 4 times daily. 06/29/21   Gildardo Pounds, NP  ?atorvastatin (LIPITOR) 40 MG tablet TAKE 1 TABLET BY MOUTH ONCE DAILY 05/06/21 08/04/21  Flinchum, Kelby Aline, FNP  ?benzonatate (TESSALON) 100 MG capsule Take 1-2 capsules (100-200 mg total) by mouth 2 (two) times daily as needed for cough. 04/04/21   Perlie Mayo, NP  ?cephALEXin (KEFLEX) 500 MG capsule Take 1 capsule (500 mg total) by mouth 2 (two) times daily for 7 days. 07/03/21 07/10/21  Couture, Cortni S, PA-C  ?clonazePAM  (KLONOPIN) 0.5 MG tablet Take 1 tablet (0.5 mg total) by mouth daily as needed (will cause drowsiness). 04/15/21 06/14/21  Flinchum, Kelby Aline, FNP  ?COVID-19 At Home Antigen Test [facility] COVID-19 HOME TEST) KIT test as directed 03/21/21   Letta Median, RPH  ?esomeprazole (NEXIUM) 40 MG capsule TAKE 1 CAPSULE BY MOUTH ONCE A DAY 07/12/20     ?hydrOXYzine (ATARAX/VISTARIL) 10 MG tablet Take 1 tablet (10 mg total) by mouth 2 (two) times daily as needed for anxiety. 12/17/20   Burnard Hawthorne, FNP  ?ibuprofen (ADVIL) 800 MG tablet Take 1 tablet by mouth daily as needed. 04/15/21   Flinchum, Kelby Aline, FNP  ?levothyroxine (SYNTHROID) 50 MCG tablet Take 50 mcg by mouth every morning. 05/26/19   [provider]  ?levothyroxine (SYNTHROID) 50 MCG tablet Take 1 tablet by mouth in the morning on an empty stomach daily ?Patient not taking: Reported on 12/17/2020 08/13/20     ?nirmatrelvir & ritonavir (PAXLOVID, 300/100,) 20 x 150 MG & 10 x 100MG TBPK Take 3 tablets by mouth twice a day for 5 days 03/21/21   Juanito Doom, MD  ?oseltamivir (TAMIFLU) 75 MG capsule Take 1 capsule (75 mg total) by mouth 2 (two) times daily. 03/21/21   Montine Circle, PA-C  ?promethazine-dextromethorphan (PROMETHAZINE-DM) 6.25-15 MG/5ML syrup Take 2.5 mLs by mouth 2 (two) times daily as needed for cough. 04/04/21   Perlie Mayo, NP  ? ? ?Family History ?Family History  ?Problem Relation Age of Onset  ? Healthy Father   ? Cancer Paternal Uncle   ?     started with prostate and spread to entire body  ? Stroke Paternal Grandmother   ? Heart attack Paternal Grandfather   ? ? ?Social History ?Social History  ? ?Tobacco Use  ? Smoking status: Never  ? Smokeless tobacco: Never  ?Vaping Use  ? Vaping Use: Never used  ?Substance Use Topics  ? Alcohol use: Yes  ?  Alcohol/week: 10.0 standard drinks  ?  Types: 10 Glasses of wine per week  ? Drug use: Never  ? ? ? ?Allergies   ?Other, Prednisone, and Tape ? ?Review of Systems ?Review of  Systems ?Pertinent negatives listed in HPI  ? ?Physical Exam ?Triage Vital Signs ?ED Triage Vitals [07/05/21 1228]  ?Enc Vitals Group  ?   BP   ?   Pulse   ?   Resp   ?   Temp   ?   Temp src   ?   SpO2   ?   Weight   ?   Height   ?   Head Circumference   ?   Peak Flow   ?   Pain Score 0  ?   Pain Loc   ?   Pain  Edu?   ?   Excl. in [place]?   ? ?No data found. ? ?Updated Vital Signs ?BP (!) 144/85 (BP Location: Left Arm)   Pulse 88   Temp 98 ?F (36.7 ?C) (Oral)   Resp 16   SpO2 94%  ? ?Visual Acuity ?Right Eye Distance:   ?Left Eye Distance:   ?Bilateral Distance:   ? ?Right Eye Near:   ?Left Eye Near:    ?Bilateral Near:    ? ?Physical Exam ?Vitals reviewed.  ?Constitutional:   ?   Appearance: Normal appearance.  ?HENT:  ?   Head: Normocephalic.  ?   Right Ear: Tympanic membrane normal. There is impacted cerumen.  ?   Left Ear: There is impacted cerumen.  ?Cardiovascular:  ?   Rate and Rhythm: Normal rate and regular rhythm.  ?Pulmonary:  ?   Effort: Pulmonary effort is normal.  ?   Breath sounds: Normal breath sounds.  ?Neurological:  ?   General: No focal deficit present.  ?   Mental Status: She is alert.  ?Psychiatric:     ?   Mood and Affect: Mood normal.     ?   Behavior: Behavior normal.  ? ? ? ?UC Treatments / Results  ?Labs ?(all labs ordered are listed, but only abnormal results are displayed) ?Labs Reviewed - No data to display ? ?EKG ? ? ?Radiology ?No results found. ? ?Procedures ?Procedures (including critical care time) ? ?Medications Ordered in UC ?Medications - No data to display ? ?Initial Impression / Assessment and Plan / UC Course  ?I have reviewed the triage vital signs and the nursing notes. ? ?Pertinent labs & imaging results that were available during my care of the patient were reviewed by me and considered in my medical decision making (see chart for details). ? ?  ?Initially unable to remove cerumen from both the ears.  Patient was instilled with [place]x and RN was able to remove the  cerumen.  ?ENT referral placed patient will advise referral coordinator if she decides to keep follow-up appointment with ENT for evaluation of inner ear problems.  Patient is having some discomfort in the left ear therefor

## 2021-07-11 ENCOUNTER — Ambulatory Visit: Payer: No Typology Code available for payment source | Admitting: Internal Medicine

## 2021-07-19 ENCOUNTER — Ambulatory Visit
Admission: EM | Admit: 2021-07-19 | Discharge: 2021-07-19 | Disposition: A | Discharge status: Home / Self Care | Payer: No Typology Code available for payment source | Attending: Emergency Medicine | Admitting: Emergency Medicine

## 2021-07-19 ENCOUNTER — Other Ambulatory Visit: Payer: Self-pay

## 2021-07-19 ENCOUNTER — Other Ambulatory Visit (HOSPITAL_COMMUNITY): Payer: Self-pay

## 2021-07-19 ENCOUNTER — Telehealth: Payer: Self-pay

## 2021-07-19 DIAGNOSIS — N3000 Acute cystitis without hematuria: Secondary | ICD-10-CM | POA: Diagnosis present

## 2021-07-19 LAB — POCT URINALYSIS DIP (MANUAL ENTRY)
Glucose, UA: 100 mg/dL — AB
Nitrite, UA: POSITIVE — AB
Protein Ur, POC: >=300 mg/dL — AB
Spec Grav, UA: 1.015 (ref 1.010–1.025)
Urobilinogen, UA: 2 E.U./dL — AB
pH, UA: 5 (ref 5.0–8.0)

## 2021-07-19 MED ORDER — NITROFURANTOIN MONOHYD MACRO 100 MG PO CAPS
100.0000 mg | ORAL_CAPSULE | Freq: Two times a day (BID) | ORAL | 0 refills | Status: DC
  Filled 2021-07-19: qty 10, 5d supply, fill #0

## 2021-07-19 MED ORDER — ONDANSETRON 4 MG PO TBDP
4.0000 mg | ORAL_TABLET | Freq: Three times a day (TID) | ORAL | 0 refills | Status: DC | PRN
  Filled 2021-07-19: qty 20, 7d supply, fill #0

## 2021-07-19 MED ORDER — FLUCONAZOLE 150 MG PO TABS
150.0000 mg | ORAL_TABLET | Freq: Every day | ORAL | 0 refills | Status: AC
  Filled 2021-07-19: qty 2, 2d supply, fill #0

## 2021-07-19 NOTE — ED Provider Notes (Signed)
?[facility] ? ? ? ?CSN: [id number] ?Arrival date & time: 07/19/21  0801 ? ? ?  ? ?History   ?Chief Complaint ?No chief complaint on file. ? ? ?HPI ?Kristi Carter is a 61 y.o. female.  ? ? ?Patient presents with chills, urinary frequency, urgency, weakened stream, dysuria, lower abdominal pressure and bilateral flank pain for 1 day.  Symptoms worsened this morning.  Has attempted use of Azo this morning, unsure if efficacy.  Denies fever, hematuria, new rash or lesions, vaginal discharge, itching or odor. ? ?Past Medical History:  ?Diagnosis Date  ? A-fib ([place])   ? Allergy   ? Anxiety   ? GERD (gastroesophageal reflux disease)   ? Hypothyroid   ? ? ?Patient Active Problem List  ? Diagnosis Date Noted  ? Cervical high risk human papillomavirus (HPV) DNA test positive 01/09/2021  ? Elevated liver enzymes 12/26/2020  ? History of Barrett's esophagus 03/13/2020  ? Body mass index 28.0-28.9, adult 01/06/2020  ? Intestinal polyp 01/03/2020  ? Skin lesion 01/03/2020  ? Large breasts 08/26/2019  ? Back pain 08/26/2019  ? History of UTI 07/27/2019  ? Blood dyscrasia- IFE1 positive- multiple myeloma panel mild abnormalities  07/20/2019  ? Antibiotic-induced yeast infection 07/20/2019  ? Vitamin D deficiency 07/20/2019  ? Routine physical examination 07/15/2019  ? Bladder wall thickening 07/15/2019  ? Bone lesion 07/15/2019  ? Blood in stool 07/15/2019  ? Recurrent UTI (urinary tract infection) 07/15/2019  ? Abdominal bloating 07/15/2019  ? Other fatigue 07/15/2019  ? Cyst of right ovary- dermoid seen on CT 07/09/2019  07/15/2019  ? History of bilateral breast implants 07/15/2019  ? GERD without esophagitis 06/14/2018  ? Hypothyroid 06/14/2018  ? Anxiety disorder 10/20/2014  ? Hyperlipidemia 10/20/2014  ? Nonulcer dyspepsia 10/20/2014  ? ? ?Past Surgical History:  ?Procedure Laterality Date  ? AUGMENTATION MAMMAPLASTY Bilateral 1990  ? saline  ? BRAIN SURGERY    ? CARDIAC ELECTROPHYSIOLOGY MAPPING AND ABLATION    ?  COLONOSCOPY WITH PROPOFOL N/A 04/13/2020  ? Procedure: COLONOSCOPY WITH PROPOFOL;  Surgeon: [name], MD;  Location: [facility] ENDOSCOPY;  Service: Gastroenterology;  Laterality: N/A;  ? ESOPHAGOGASTRODUODENOSCOPY (EGD) WITH PROPOFOL N/A 04/13/2020  ? Procedure: ESOPHAGOGASTRODUODENOSCOPY (EGD) WITH PROPOFOL;  Surgeon: [name], MD;  Location: [facility] ENDOSCOPY;  Service: Gastroenterology;  Laterality: N/A;  ? ESOPHAGOGASTRODUODENOSCOPY (EGD) WITH PROPOFOL N/A 07/16/2020  ? Procedure: ESOPHAGOGASTRODUODENOSCOPY (EGD) WITH PROPOFOL;  Surgeon: [name], MD;  Location: [facility] ENDOSCOPY;  Service: Gastroenterology;  Laterality: N/A;  ? ? ?OB History   ? ? Gravida  ?2  ? Para  ?2  ? Term  ?2  ? Preterm  ?   ? AB  ?   ? Living  ?2  ?  ? ? SAB  ?   ? IAB  ?   ? Ectopic  ?   ? Multiple  ?   ? Live Births  ?2  ?   ?  ?  ? ? ? ?Home Medications   ? ?Prior to Admission medications   ?Medication Sig Start Date End Date Taking? Authorizing Provider  ?nitrofurantoin, macrocrystal-monohydrate, (MACROBID) 100 MG capsule Take 1 capsule (100 mg total) by mouth 2 (two) times daily. 07/19/21  Yes Tyreka Henneke, Leitha Schuller, NP  ?ondansetron (ZOFRAN-ODT) 4 MG disintegrating tablet Take 1 tablet (4 mg total) by mouth every 8 (eight) hours as needed for nausea or vomiting. 07/19/21  Yes Embree Brawley, Leitha Schuller, NP  ?acetic acid-hydrocortisone (VOSOL-HC) OTIC solution Insert saturated wick of  cotton into left ear; keep moist for 24 hours by adding 3 to 5 drops every 4 to 6 hours; remove wick after 24 hours and instill 5 drops 3 to 4 times daily. 06/29/21   Gildardo Pounds, NP  ?atorvastatin (LIPITOR) 40 MG tablet TAKE 1 TABLET BY MOUTH ONCE DAILY 05/06/21 08/04/21  Flinchum, Kelby Aline, FNP  ?benzonatate (TESSALON) 100 MG capsule Take 1-2 capsules (100-200 mg total) by mouth 2 (two) times daily as needed for cough. 04/04/21   Perlie Mayo, NP  ?clonazePAM (KLONOPIN) 0.5 MG tablet Take 1 tablet (0.5 mg total) by mouth daily as needed (will cause drowsiness).  04/15/21 06/14/21  Flinchum, Kelby Aline, FNP  ?COVID-19 At Home Antigen Test [facility] COVID-19 HOME TEST) KIT test as directed 03/21/21   Letta Median, RPH  ?esomeprazole (NEXIUM) 40 MG capsule TAKE 1 CAPSULE BY MOUTH ONCE A DAY 07/12/20     ?hydrOXYzine (ATARAX/VISTARIL) 10 MG tablet Take 1 tablet (10 mg total) by mouth 2 (two) times daily as needed for anxiety. 12/17/20   Burnard Hawthorne, FNP  ?ibuprofen (ADVIL) 800 MG tablet Take 1 tablet by mouth daily as needed. 04/15/21   Flinchum, Kelby Aline, FNP  ?levothyroxine (SYNTHROID) 50 MCG tablet Take 50 mcg by mouth every morning. 05/26/19   [provider]  ?levothyroxine (SYNTHROID) 50 MCG tablet Take 1 tablet by mouth in the morning on an empty stomach daily ?Patient not taking: Reported on 12/17/2020 08/13/20     ?nirmatrelvir & ritonavir (PAXLOVID, 300/100,) 20 x 150 MG & 10 x 100MG TBPK Take 3 tablets by mouth twice a day for 5 days 03/21/21   Juanito Doom, MD  ?ofloxacin (FLOXIN) 0.3 % OTIC solution Place 3 drops into the left ear 2 (two) times daily for 7 days. 07/05/21 07/30/21  Scot Jun, FNP  ?oseltamivir (TAMIFLU) 75 MG capsule Take 1 capsule (75 mg total) by mouth 2 (two) times daily. 03/21/21   Montine Circle, PA-C  ?promethazine-dextromethorphan (PROMETHAZINE-DM) 6.25-15 MG/5ML syrup Take 2.5 mLs by mouth 2 (two) times daily as needed for cough. 04/04/21   Perlie Mayo, NP  ? ? ?Family History ?Family History  ?Problem Relation Age of Onset  ? Healthy Father   ? Cancer Paternal Uncle   ?     started with prostate and spread to entire body  ? Stroke Paternal Grandmother   ? Heart attack Paternal Grandfather   ? ? ?Social History ?Social History  ? ?Tobacco Use  ? Smoking status: Never  ? Smokeless tobacco: Never  ?Vaping Use  ? Vaping Use: Never used  ?Substance Use Topics  ? Alcohol use: Yes  ?  Alcohol/week: 10.0 standard drinks  ?  Types: 10 Glasses of wine per week  ? Drug use: Never  ? ? ? ?Allergies   ?Other, Prednisone, and  Tape ? ? ?Review of Systems ?Review of Systems ?Defer to HPI ? ? ?Physical Exam ?Triage Vital Signs ?ED Triage Vitals  ?Enc Vitals Group  ?   BP 07/19/21 0823 132/81  ?   Pulse Rate 07/19/21 0823 94  ?   Resp 07/19/21 0823 18  ?   Temp 07/19/21 0823 98.1 ?F (36.7 ?C)  ?   Temp Source 07/19/21 0823 Temporal  ?   SpO2 07/19/21 0823 97 %  ?   Weight --   ?   Height --   ?   Head Circumference --   ?   Peak Flow --   ?  Pain Score 07/19/21 0824 8  ?   Pain Loc --   ?   Pain Edu? --   ?   Excl. in [place]? --   ? ?No data found. ? ?Updated Vital Signs ?BP 132/81 (BP Location: Left Arm)   Pulse 94   Temp 98.1 ?F (36.7 ?C) (Temporal)   Resp 18   SpO2 97%  ? ?Visual Acuity ?Right Eye Distance:   ?Left Eye Distance:   ?Bilateral Distance:   ? ?Right Eye Near:   ?Left Eye Near:    ?Bilateral Near:    ? ?Physical Exam ?Constitutional:   ?   Appearance: Normal appearance.  ?HENT:  ?   Head: Normocephalic.  ?Eyes:  ?   Extraocular Movements: Extraocular movements intact.  ?Pulmonary:  ?   Effort: Pulmonary effort is normal.  ?Abdominal:  ?   General: Abdomen is flat. Bowel sounds are normal.  ?   Palpations: Abdomen is soft.  ?   Tenderness: There is abdominal tenderness in the suprapubic area. There is right CVA tenderness and left CVA tenderness.  ?Skin: ?   General: Skin is warm.  ?Neurological:  ?   Mental Status: She is alert and oriented to person, place, and time. Mental status is at baseline.  ?Psychiatric:     ?   Mood and Affect: Mood normal.     ?   Behavior: Behavior normal.  ? ? ? ?UC Treatments / Results  ?Labs ?(all labs ordered are listed, but only abnormal results are displayed) ?Labs Reviewed  ?POCT URINALYSIS DIP (MANUAL ENTRY) - Abnormal; Notable for the following components:  ?    Result Value  ? Color, UA orange (*)   ? Clarity, UA cloudy (*)   ? Glucose, UA =100 (*)   ? Bilirubin, UA small (*)   ? Ketones, POC UA small (15) (*)   ? Blood, UA large (*)   ? Protein Ur, POC >=300 (*)   ? Urobilinogen, UA 2.0  (*)   ? Nitrite, UA Positive (*)   ? Leukocytes, UA Large (3+) (*)   ? All other components within normal limits  ?URINE CULTURE  ? ? ?EKG ? ? ?Radiology ?No results found. ? ?Procedures ?Procedures (including cr

## 2021-07-19 NOTE — ED Triage Notes (Signed)
Patient presents to Urgent Care with complaints of possible UTI. She c/o of dysuria, flank pain, abdominal pressure, and urgency since yesterday. Took a dose of azo. ? ?Denies fever or hematuria.  ?

## 2021-07-19 NOTE — Discharge Instructions (Signed)
Your urinalysis shows Kristi Carter blood cells and nitrates which are indicative of infection, your urine will be sent to the lab to determine exactly which bacteria is present, if any changes need to be made to your medications you will be notified ? ?Begin use of Macrobid twice daily for 7 days ? ?May use zofran every 8 hours, wait at least 30 minutes before attempting to eat after  ? ?You may use over-the-counter Azo to help minimize your symptoms until antibiotic removes bacteria, this medication will turn your urine orange ? ?Increase your fluid intake through use of water ? ?As always practice good hygiene, wiping front to back and avoidance of scented vaginal products to prevent further irritation ? ?If symptoms continue to persist after use of medication or recur please follow-up with urgent care or your primary doctor as needed  ?

## 2021-07-23 ENCOUNTER — Other Ambulatory Visit (HOSPITAL_COMMUNITY): Payer: Self-pay

## 2021-07-23 ENCOUNTER — Other Ambulatory Visit: Payer: Self-pay

## 2021-07-23 LAB — URINE CULTURE: Culture: >=100000 — AB

## 2021-07-23 MED ORDER — ESOMEPRAZOLE MAGNESIUM 40 MG PO CPDR
40.0000 mg | DELAYED_RELEASE_CAPSULE | Freq: Every day | ORAL | 3 refills | Status: DC
  Filled 2021-07-23: qty 90, 90d supply, fill #0
  Filled 2021-10-18: qty 90, 90d supply, fill #1
  Filled 2022-01-09: qty 90, 90d supply, fill #2
  Filled 2022-04-05: qty 90, 90d supply, fill #3

## 2021-07-23 NOTE — Telephone Encounter (Signed)
Appointment has been cancelled.

## 2021-07-23 NOTE — Telephone Encounter (Signed)
Pls cancel pt's Monday appt with me. Thx.

## 2021-07-24 NOTE — Telephone Encounter (Signed)
Called patient and scheduled her for lab tomorrow for urine recheck ?

## 2021-07-25 ENCOUNTER — Other Ambulatory Visit (INDEPENDENT_AMBULATORY_CARE_PROVIDER_SITE_OTHER): Payer: No Typology Code available for payment source

## 2021-07-25 ENCOUNTER — Other Ambulatory Visit: Payer: Self-pay

## 2021-07-25 DIAGNOSIS — R300 Dysuria: Secondary | ICD-10-CM

## 2021-07-26 ENCOUNTER — Other Ambulatory Visit: Payer: Self-pay

## 2021-07-26 ENCOUNTER — Telehealth (INDEPENDENT_AMBULATORY_CARE_PROVIDER_SITE_OTHER): Payer: No Typology Code available for payment source | Admitting: Internal Medicine

## 2021-07-26 ENCOUNTER — Encounter: Payer: Self-pay | Admitting: Internal Medicine

## 2021-07-26 DIAGNOSIS — N3090 Cystitis, unspecified without hematuria: Secondary | ICD-10-CM | POA: Insufficient documentation

## 2021-07-26 LAB — URINALYSIS, ROUTINE W REFLEX MICROSCOPIC
Bilirubin Urine: NEGATIVE
Hgb urine dipstick: NEGATIVE
Ketones, ur: NEGATIVE
Nitrite: NEGATIVE
Specific Gravity, Urine: 1.015 (ref 1.000–1.030)
Total Protein, Urine: NEGATIVE
Urine Glucose: NEGATIVE
Urobilinogen, UA: 0.2 (ref 0.0–1.0)
pH: 6 (ref 5.0–8.0)

## 2021-07-26 LAB — URINE CULTURE
MICRO NUMBER:: 13414667
SPECIMEN QUALITY:: ADEQUATE

## 2021-07-26 MED ORDER — CIPROFLOXACIN HCL 250 MG PO TABS
250.0000 mg | ORAL_TABLET | Freq: Two times a day (BID) | ORAL | 0 refills | Status: AC
  Filled 2021-07-26: qty 10, 5d supply, fill #0

## 2021-07-26 MED ORDER — FLUCONAZOLE 150 MG PO TABS
150.0000 mg | ORAL_TABLET | Freq: Every day | ORAL | 0 refills | Status: DC
  Filled 2021-07-26: qty 2, 2d supply, fill #0

## 2021-07-26 NOTE — Assessment & Plan Note (Signed)
Reviewed culture and sensitivities of E Coli culture from urine on May 12  Pan sensitive ,  But macrobid was the weakest response.  Will  Treat empirically with cipro 250 mg bid x 5 days,  Culture is pending . If symptoms doe not resolve will need to see Urology

## 2021-07-26 NOTE — Progress Notes (Signed)
Virtual Visit via [place] Note  This visit type was conducted due to national recommendations for restrictions regarding the COVID-19 pandemic (e.g. social distancing).  This format is felt to be most appropriate for this patient at this time.  All issues noted in this document were discussed and addressed.  No physical exam was performed (except for noted visual exam findings with Video Visits).   I connected withNAME@ on 07/26/21 at  1:15 PM EDT by a video enabled telemedicine application  and verified that I am speaking with the correct person using two identifiers. Location patient: home Location provider: work or home office Persons participating in the virtual visit: patient, provider  I discussed the limitations, risks, security and privacy concerns of performing an evaluation and management service by telephone and the availability of in person appointments. I also discussed with the patient that there may be a patient responsible charge related to this service. The patient expressed understanding and agreed to proceed.   Reason for visit: persistent urinary frequency and dysuria  HPI:  61 yr old female with recurrent UTI, one in April (info not available),  most recently treated May 12  for  E COLI pan sensitive treated with macrobid.  Did not resolve.  Current UA 7-10 WBC;s/leukocytosis,,    ROS: See pertinent positives and negatives per HPI.  Past Medical History:  Diagnosis Date   A-fib [facility])    Allergy    Antibiotic-induced yeast infection 07/20/2019   Anxiety    GERD (gastroesophageal reflux disease)    Hypothyroid     Past Surgical History:  Procedure Laterality Date   AUGMENTATION MAMMAPLASTY Bilateral 1990   saline   BRAIN SURGERY     CARDIAC ELECTROPHYSIOLOGY MAPPING AND ABLATION     COLONOSCOPY WITH PROPOFOL N/A 04/13/2020   Procedure: COLONOSCOPY WITH PROPOFOL;  Surgeon: [name], MD;  Location: [facility] ENDOSCOPY;  Service: Gastroenterology;  Laterality: N/A;    ESOPHAGOGASTRODUODENOSCOPY (EGD) WITH PROPOFOL N/A 04/13/2020   Procedure: ESOPHAGOGASTRODUODENOSCOPY (EGD) WITH PROPOFOL;  Surgeon: [name], MD;  Location: [facility] ENDOSCOPY;  Service: Gastroenterology;  Laterality: N/A;   ESOPHAGOGASTRODUODENOSCOPY (EGD) WITH PROPOFOL N/A 07/16/2020   Procedure: ESOPHAGOGASTRODUODENOSCOPY (EGD) WITH PROPOFOL;  Surgeon: [name], MD;  Location: [facility] ENDOSCOPY;  Service: Gastroenterology;  Laterality: N/A;    Family History  Problem Relation Age of Onset   Healthy Father    Cancer Paternal Uncle        started with prostate and spread to entire body   Stroke Paternal Grandmother    Heart attack Paternal Grandfather     SOCIAL HX:  reports that she has never smoked. She has never used smokeless tobacco. She reports current alcohol use of about 10.0 standard drinks per week. She reports that she does not use drugs.    Current Outpatient Medications:    acetic acid-hydrocortisone (VOSOL-HC) OTIC solution, Insert saturated wick of cotton into left ear; keep moist for 24 hours by adding 3 to 5 drops every 4 to 6 hours; remove wick after 24 hours and instill 5 drops 3 to 4 times daily., Disp: 10 mL, Rfl: 0   atorvastatin (LIPITOR) 40 MG tablet, TAKE 1 TABLET BY MOUTH ONCE DAILY, Disp: 90 tablet, Rfl: 0   ciprofloxacin (CIPRO) 250 MG tablet, Take 1 tablet (250 mg total) by mouth 2 (two) times daily for 3 days., Disp: 10 tablet, Rfl: 0   clonazePAM (KLONOPIN) 0.5 MG tablet, Take 1 tablet (0.5 mg total) by mouth daily as needed (will cause drowsiness)., Disp:  60 tablet, Rfl: 0   esomeprazole (NEXIUM) 40 MG capsule, TAKE 1 CAPSULE BY MOUTH ONCE A DAY, Disp: 90 capsule, Rfl: 3   fluconazole (DIFLUCAN) 150 MG tablet, Take 1 tablet (150 mg total) by mouth daily., Disp: 2 tablet, Rfl: 0   ibuprofen (ADVIL) 800 MG tablet, Take 1 tablet by mouth daily as needed., Disp: 90 tablet, Rfl: 0   levothyroxine (SYNTHROID) 50 MCG tablet, Take 50 mcg by mouth every morning.,  Disp: , Rfl:    ondansetron (ZOFRAN-ODT) 4 MG disintegrating tablet, Take 1 tablet (4 mg total) by mouth every 8 (eight) hours as needed for nausea or vomiting., Disp: 20 tablet, Rfl: 0  EXAM:  VITALS per patient if applicable:  GENERAL: alert, oriented, appears well and in no acute distress  HEENT: atraumatic, conjunttiva clear, no obvious abnormalities on inspection of external nose and ears  NECK: normal movements of the head and neck  LUNGS: on inspection no signs of respiratory distress, breathing rate appears normal, no obvious gross SOB, gasping or wheezing  CV: no obvious cyanosis  MS: moves all visible extremities without noticeable abnormality  PSYCH/NEURO: pleasant and cooperative, no obvious depression or anxiety, speech and thought processing grossly intact  ASSESSMENT AND PLAN:  Discussed the following assessment and plan:  Cystitis  Cystitis Reviewed culture and sensitivities of E Coli culture from urine on May 12  Pan sensitive ,  But macrobid was the weakest response.  Will  Treat empirically with cipro 250 mg bid x 5 days,  Culture is pending . If symptoms doe not resolve will need to see Urology     I discussed the assessment and treatment plan with the patient. The patient was provided an opportunity to ask questions and all were answered. The patient agreed with the plan and demonstrated an understanding of the instructions.   The patient was advised to call back or seek an in-person evaluation if the symptoms worsen or if the condition fails to improve as anticipated.   I spent 20 minutes dedicated to the care of this patient on the date of this encounter to include pre-visit review of her medical history,  including all UA/culture data from the past year, Face-to-face time with the patient , and post visit ordering of testing and therapeutics.    Crecencio Mc, MD

## 2021-07-27 ENCOUNTER — Other Ambulatory Visit: Payer: Self-pay

## 2021-07-28 ENCOUNTER — Encounter: Payer: Self-pay | Admitting: Internal Medicine

## 2021-07-28 DIAGNOSIS — R748 Abnormal levels of other serum enzymes: Secondary | ICD-10-CM

## 2021-07-29 ENCOUNTER — Ambulatory Visit: Payer: No Typology Code available for payment source | Admitting: Obstetrics and Gynecology

## 2021-07-29 ENCOUNTER — Other Ambulatory Visit (HOSPITAL_COMMUNITY): Payer: Self-pay

## 2021-07-30 ENCOUNTER — Other Ambulatory Visit (HOSPITAL_COMMUNITY): Payer: Self-pay

## 2021-07-30 ENCOUNTER — Encounter: Payer: Self-pay | Admitting: Internal Medicine

## 2021-08-01 ENCOUNTER — Other Ambulatory Visit (HOSPITAL_COMMUNITY): Payer: Self-pay

## 2021-08-02 ENCOUNTER — Ambulatory Visit (INDEPENDENT_AMBULATORY_CARE_PROVIDER_SITE_OTHER): Payer: No Typology Code available for payment source | Admitting: Nurse Practitioner

## 2021-08-02 ENCOUNTER — Encounter: Payer: Self-pay | Admitting: Nurse Practitioner

## 2021-08-02 VITALS — BP 118/81 | HR 83 | Ht 63.0 in | Wt 154.6 lb

## 2021-08-02 DIAGNOSIS — R350 Frequency of micturition: Secondary | ICD-10-CM

## 2021-08-02 DIAGNOSIS — K219 Gastro-esophageal reflux disease without esophagitis: Secondary | ICD-10-CM

## 2021-08-02 DIAGNOSIS — E039 Hypothyroidism, unspecified: Secondary | ICD-10-CM

## 2021-08-02 DIAGNOSIS — E785 Hyperlipidemia, unspecified: Secondary | ICD-10-CM

## 2021-08-02 DIAGNOSIS — Z7689 Persons encountering health services in other specified circumstances: Secondary | ICD-10-CM | POA: Diagnosis not present

## 2021-08-02 NOTE — Progress Notes (Unsigned)
New Patient Office Visit  Subjective    Patient ID: Kristi Carter, female    DOB: 1960-03-31  Age: 61 y.o. MRN: [id number]  CC:  Chief Complaint  Patient presents with   New Patient (Initial Visit)    HPI Kristi Carter presents to establish care.  Patient previous PCP was Ms. Kristi Chess, FNP.  Patient has a history of hypertension, hypothyroidism, hyperlipidemia, anxiety and Barrett's esophagus   Outpatient Encounter Medications as of 08/02/2021  Medication Sig   esomeprazole (NEXIUM) 40 MG capsule TAKE 1 CAPSULE BY MOUTH ONCE A DAY   levothyroxine (SYNTHROID) 50 MCG tablet Take 50 mcg by mouth every morning.   Omega-3 Fatty Acids (FISH OIL) 300 MG CAPS Take by mouth.   atorvastatin (LIPITOR) 40 MG tablet TAKE 1 TABLET BY MOUTH ONCE DAILY   clonazePAM (KLONOPIN) 0.5 MG tablet Take 1 tablet (0.5 mg total) by mouth daily as needed (will cause drowsiness).   [DISCONTINUED] acetic acid-hydrocortisone (VOSOL-HC) OTIC solution Insert saturated wick of cotton into left ear; keep moist for 24 hours by adding 3 to 5 drops every 4 to 6 hours; remove wick after 24 hours and instill 5 drops 3 to 4 times daily. (Patient not taking: Reported on 08/02/2021)   [DISCONTINUED] atorvastatin (LIPITOR) 40 MG tablet TAKE 1 TABLET BY MOUTH ONCE DAILY   [DISCONTINUED] fluconazole (DIFLUCAN) 150 MG tablet Take 1 tablet (150 mg total) by mouth daily. (Patient not taking: Reported on 08/02/2021)   [DISCONTINUED] ibuprofen (ADVIL) 800 MG tablet Take 1 tablet by mouth daily as needed. (Patient not taking: Reported on 08/02/2021)   [DISCONTINUED] levothyroxine (SYNTHROID) 50 MCG tablet Take 1 tablet by mouth in the morning on an empty stomach daily (Patient not taking: Reported on 12/17/2020)   [DISCONTINUED] ondansetron (ZOFRAN-ODT) 4 MG disintegrating tablet Take 1 tablet (4 mg total) by mouth every 8 (eight) hours as needed for nausea or vomiting. (Patient not taking: Reported on 08/02/2021)   No  facility-administered encounter medications on file as of 08/02/2021.    Past Medical History:  Diagnosis Date   A-fib [facility])    Allergy    Antibiotic-induced yeast infection 07/20/2019   Anxiety    GERD (gastroesophageal reflux disease)    Hypothyroid     Past Surgical History:  Procedure Laterality Date   AUGMENTATION MAMMAPLASTY Bilateral 1990   saline   BRAIN SURGERY     CARDIAC ELECTROPHYSIOLOGY MAPPING AND ABLATION     COLONOSCOPY WITH PROPOFOL N/A 04/13/2020   Procedure: COLONOSCOPY WITH PROPOFOL;  Surgeon: [name], MD;  Location: [facility] ENDOSCOPY;  Service: Gastroenterology;  Laterality: N/A;   ESOPHAGOGASTRODUODENOSCOPY (EGD) WITH PROPOFOL N/A 04/13/2020   Procedure: ESOPHAGOGASTRODUODENOSCOPY (EGD) WITH PROPOFOL;  Surgeon: [name], MD;  Location: [facility] ENDOSCOPY;  Service: Gastroenterology;  Laterality: N/A;   ESOPHAGOGASTRODUODENOSCOPY (EGD) WITH PROPOFOL N/A 07/16/2020   Procedure: ESOPHAGOGASTRODUODENOSCOPY (EGD) WITH PROPOFOL;  Surgeon: [name], MD;  Location: [facility] ENDOSCOPY;  Service: Gastroenterology;  Laterality: N/A;    Family History  Problem Relation Age of Onset   Healthy Father    Cancer Paternal Uncle        started with prostate and spread to entire body   Stroke Paternal Grandmother    Heart attack Paternal Grandfather     Social History   Socioeconomic History   Marital status: Married    Spouse name: Not on file   Number of children: Not on file   Years of education: Not on file   Highest education level:  Not on file  Occupational History   Not on file  Tobacco Use   Smoking status: Never   Smokeless tobacco: Never  Vaping Use   Vaping Use: Never used  Substance and Sexual Activity   Alcohol use: Yes    Alcohol/week: 10.0 standard drinks    Types: 10 Glasses of wine per week   Drug use: Never   Sexual activity: Yes    Birth control/protection: Post-menopausal  Other Topics Concern   Not on file  Social History Narrative   Not on  file   Social Determinants of Health   Financial Resource Strain: Not on file  Food Insecurity: Not on file  Transportation Needs: Not on file  Physical Activity: Not on file  Stress: Not on file  Social Connections: Not on file  Intimate Partner Violence: Not on file    Review of Systems  Constitutional:  Negative for chills, fever and weight loss.  HENT:  Negative for ear pain, hearing loss and tinnitus.   Eyes:  Negative for blurred vision, double vision and photophobia.  Respiratory:  Negative for cough and hemoptysis.   Cardiovascular:  Negative for chest pain, palpitations and leg swelling.  Gastrointestinal:  Negative for constipation, heartburn and vomiting.  Genitourinary:  Negative for dysuria and urgency.  Musculoskeletal:  Negative for back pain, myalgias and neck pain.  Skin:  Negative for itching and rash.  Neurological:  Negative for dizziness, tremors and headaches.  Psychiatric/Behavioral:  Negative for depression, substance abuse and suicidal ideas.        Objective    BP 118/81   Pulse 83   Ht '5\' 3"'$  (1.6 m)   Wt 154 lb 9.6 oz (70.1 kg)   BMI 27.39 kg/m   Physical Exam Constitutional:      Appearance: Normal appearance. She is well-developed and overweight.  HENT:     Head: Normocephalic and atraumatic.     Right Ear: Tympanic membrane normal.     Left Ear: Tympanic membrane normal.     Nose: Nose normal.     Mouth/Throat:     Mouth: Mucous membranes are moist.     Pharynx: Oropharynx is clear.  Eyes:     Extraocular Movements: Extraocular movements intact.     Conjunctiva/sclera: Conjunctivae normal.     Pupils: Pupils are equal, round, and reactive to light.  Cardiovascular:     Rate and Rhythm: Normal rate and regular rhythm.     Pulses: Normal pulses.     Heart sounds: Normal heart sounds.  Pulmonary:     Effort: Pulmonary effort is normal.     Breath sounds: Normal breath sounds.  Abdominal:     General: Bowel sounds are normal.      Palpations: Abdomen is soft.  Musculoskeletal:        General: Normal range of motion.     Cervical back: Normal range of motion.  Skin:    General: Skin is warm.     Capillary Refill: Capillary refill takes less than 2 seconds.  Neurological:     General: No focal deficit present.     Mental Status: She is alert and oriented to person, place, and time. Mental status is at baseline.  Psychiatric:        Mood and Affect: Mood normal.        Behavior: Behavior normal.        Thought Content: Thought content normal.        Judgment: Judgment normal.  Assessment & Plan:   Problem List Items Addressed This Visit       Endocrine   Hypothyroid    Stable on medication. Continue levothyroxine 50 mcg.          Other   Hyperlipidemia    Continue atorvastatin 40 mg daily. Advised patient to consume variety of food including fruits, vegetables, whole grains, complex carbohydrates and proteins.         Relevant Medications   atorvastatin (LIPITOR) 40 MG tablet   Encounter to establish care with new doctor - Primary    Care established. Advised patient to eat a balanced diet and follow regular physical activity schedule        No follow-ups on file.   Theresia Lo, NP

## 2021-08-05 DIAGNOSIS — Z7689 Persons encountering health services in other specified circumstances: Secondary | ICD-10-CM | POA: Insufficient documentation

## 2021-08-05 MED ORDER — ATORVASTATIN CALCIUM 40 MG PO TABS
ORAL_TABLET | Freq: Every day | ORAL | 1 refills | Status: DC
  Filled 2021-08-05: qty 90, 90d supply, fill #0
  Filled 2021-10-18: qty 90, 90d supply, fill #1

## 2021-08-05 NOTE — Assessment & Plan Note (Signed)
Stable on medication. Continue levothyroxine 50 mcg.

## 2021-08-05 NOTE — Assessment & Plan Note (Signed)
Continue atorvastatin 40 mg daily. Advised patient to consume variety of food including fruits, vegetables, whole grains, complex carbohydrates and proteins.

## 2021-08-05 NOTE — Assessment & Plan Note (Signed)
Care established. Advised patient to eat a balanced diet and follow regular physical activity schedule

## 2021-08-06 ENCOUNTER — Other Ambulatory Visit: Payer: Self-pay | Admitting: Internal Medicine

## 2021-08-06 ENCOUNTER — Other Ambulatory Visit (HOSPITAL_COMMUNITY): Payer: Self-pay

## 2021-08-09 ENCOUNTER — Other Ambulatory Visit (HOSPITAL_COMMUNITY): Payer: Self-pay

## 2021-08-09 MED ORDER — CLONAZEPAM 0.5 MG PO TABS: 0.5000 mg | ORAL_TABLET | Freq: Two times a day (BID) | ORAL | 0 refills | Status: DC | PRN

## 2021-08-09 MED ORDER — CLONAZEPAM 0.5 MG PO TABS
0.5000 mg | ORAL_TABLET | Freq: Two times a day (BID) | ORAL | 0 refills | Status: DC | PRN
  Filled 2021-08-09: qty 60, 30d supply, fill #0

## 2021-08-09 NOTE — Addendum Note (Signed)
Addended by: Alois Cliche on: 08/09/2021 10:39 AM   Modules accepted: Orders

## 2021-08-17 ENCOUNTER — Other Ambulatory Visit (HOSPITAL_COMMUNITY): Payer: Self-pay

## 2021-09-03 ENCOUNTER — Other Ambulatory Visit (HOSPITAL_COMMUNITY): Payer: Self-pay

## 2021-09-03 ENCOUNTER — Other Ambulatory Visit: Payer: Self-pay | Admitting: Internal Medicine

## 2021-09-11 ENCOUNTER — Other Ambulatory Visit (HOSPITAL_COMMUNITY): Payer: Self-pay

## 2021-09-11 ENCOUNTER — Other Ambulatory Visit: Payer: Self-pay

## 2021-09-11 MED ORDER — IBUPROFEN 800 MG PO TABS
800.0000 mg | ORAL_TABLET | Freq: Every day | ORAL | 0 refills | Status: DC | PRN
  Filled 2021-09-11: qty 30, 30d supply, fill #0

## 2021-10-18 ENCOUNTER — Other Ambulatory Visit (HOSPITAL_COMMUNITY): Payer: Self-pay

## 2021-10-18 ENCOUNTER — Other Ambulatory Visit: Payer: Self-pay | Admitting: Nurse Practitioner

## 2021-10-18 ENCOUNTER — Other Ambulatory Visit: Payer: Self-pay | Admitting: Internal Medicine

## 2021-10-18 MED ORDER — IBUPROFEN 800 MG PO TABS
800.0000 mg | ORAL_TABLET | Freq: Every day | ORAL | 0 refills | Status: DC | PRN
  Filled 2021-10-18: qty 30, 30d supply, fill #0

## 2021-10-23 ENCOUNTER — Other Ambulatory Visit (HOSPITAL_COMMUNITY): Payer: Self-pay

## 2021-10-24 ENCOUNTER — Other Ambulatory Visit (HOSPITAL_COMMUNITY): Payer: Self-pay

## 2021-11-08 ENCOUNTER — Ambulatory Visit (INDEPENDENT_AMBULATORY_CARE_PROVIDER_SITE_OTHER): Payer: No Typology Code available for payment source | Admitting: Nurse Practitioner

## 2021-11-08 ENCOUNTER — Other Ambulatory Visit (HOSPITAL_COMMUNITY): Payer: Self-pay

## 2021-11-08 VITALS — BP 132/82 | HR 88 | Ht 63.0 in | Wt 154.6 lb

## 2021-11-08 DIAGNOSIS — E785 Hyperlipidemia, unspecified: Secondary | ICD-10-CM | POA: Diagnosis not present

## 2021-11-08 DIAGNOSIS — E038 Other specified hypothyroidism: Secondary | ICD-10-CM

## 2021-11-08 DIAGNOSIS — E663 Overweight: Secondary | ICD-10-CM

## 2021-11-08 DIAGNOSIS — F419 Anxiety disorder, unspecified: Secondary | ICD-10-CM | POA: Diagnosis not present

## 2021-11-08 MED ORDER — TRAZODONE HCL 50 MG PO TABS
25.0000 mg | ORAL_TABLET | Freq: Every evening | ORAL | 3 refills | Status: DC | PRN
  Filled 2021-11-08: qty 30, 30d supply, fill #0

## 2021-11-08 NOTE — Progress Notes (Signed)
Established Patient Office Visit  Subjective:  Patient ID: Kristi Carter, female    DOB: [birth year]  Age: 61 y.o. MRN: [id number]  CC: No chief complaint on file.    HPI  Kristi Carter presents for routine follow up. Last Tuesday she has an episode and she was choking at work and her BP was elevated and took 20 minutes to get her back to normal.  She would like to get the blood work done.    HPI   Past Medical History:  Diagnosis Date   A-fib [facility])    Allergy    Antibiotic-induced yeast infection 07/20/2019   Anxiety    GERD (gastroesophageal reflux disease)    Hypothyroid     Past Surgical History:  Procedure Laterality Date   AUGMENTATION MAMMAPLASTY Bilateral 1990   saline   BRAIN SURGERY     CARDIAC ELECTROPHYSIOLOGY MAPPING AND ABLATION     COLONOSCOPY WITH PROPOFOL N/A 04/13/2020   Procedure: COLONOSCOPY WITH PROPOFOL;  Surgeon: [name], MD;  Location: [facility] ENDOSCOPY;  Service: Gastroenterology;  Laterality: N/A;   ESOPHAGOGASTRODUODENOSCOPY (EGD) WITH PROPOFOL N/A 04/13/2020   Procedure: ESOPHAGOGASTRODUODENOSCOPY (EGD) WITH PROPOFOL;  Surgeon: [name], MD;  Location: [facility] ENDOSCOPY;  Service: Gastroenterology;  Laterality: N/A;   ESOPHAGOGASTRODUODENOSCOPY (EGD) WITH PROPOFOL N/A 07/16/2020   Procedure: ESOPHAGOGASTRODUODENOSCOPY (EGD) WITH PROPOFOL;  Surgeon: [name], MD;  Location: [facility] ENDOSCOPY;  Service: Gastroenterology;  Laterality: N/A;    Family History  Problem Relation Age of Onset   Healthy Father    Cancer Paternal Uncle        started with prostate and spread to entire body   Stroke Paternal Grandmother    Heart attack Paternal Grandfather     Social History   Socioeconomic History   Marital status: Married    Spouse name: Not on file   Number of children: Not on file   Years of education: Not on file   Highest education level: Not on file  Occupational History   Not on file  Tobacco Use   Smoking status: Never   Smokeless  tobacco: Never  Vaping Use   Vaping Use: Never used  Substance and Sexual Activity   Alcohol use: Yes    Alcohol/week: 10.0 standard drinks of alcohol    Types: 10 Glasses of wine per week   Drug use: Never   Sexual activity: Yes    Birth control/protection: Post-menopausal  Other Topics Concern   Not on file  Social History Narrative   Not on file   Social Determinants of Health   Financial Resource Strain: Not on file  Food Insecurity: Not on file  Transportation Needs: Not on file  Physical Activity: Not on file  Stress: Not on file  Social Connections: Not on file  Intimate Partner Violence: Not on file     Outpatient Medications Prior to Visit  Medication Sig Dispense Refill   atorvastatin (LIPITOR) 40 MG tablet TAKE 1 TABLET BY MOUTH ONCE DAILY 90 tablet 1   esomeprazole (NEXIUM) 40 MG capsule TAKE 1 CAPSULE BY MOUTH ONCE A DAY 90 capsule 3   ibuprofen (ADVIL) 800 MG tablet Take 1 tablet (800 mg total) by mouth daily as needed. 30 tablet 0   levothyroxine (SYNTHROID) 50 MCG tablet Take 50 mcg by mouth every morning.     Omega-3 Fatty Acids (FISH OIL) 300 MG CAPS Take by mouth.     clonazePAM (KLONOPIN) 0.5 MG tablet Take 1 tablet (0.5 mg total) by  mouth 2 (two) times daily as needed (will cause drowsiness). 60 tablet 0   clonazePAM (KLONOPIN) 0.5 MG tablet Take 1 tablet by mouth 2 times daily as needed (will cause drowsiness) 60 tablet 0   No facility-administered medications prior to visit.    Allergies  Allergen Reactions   Other     Cats, Mold and Feathers   Prednisone     Other reaction(s): Vomiting   Tape     ROS Review of Systems  Constitutional: Negative.   HENT: Negative.    Eyes: Negative.   Respiratory:  Negative for apnea, shortness of breath and wheezing.   Cardiovascular:  Negative for chest pain and palpitations.  Gastrointestinal: Negative.   Genitourinary: Negative.   Musculoskeletal: Negative.   Neurological: Negative.    Psychiatric/Behavioral:  Negative for agitation, behavioral problems and confusion.       Objective:    Physical Exam Constitutional:      Appearance: Normal appearance. She is overweight.  HENT:     Head: Normocephalic.     Right Ear: Tympanic membrane normal.     Left Ear: Tympanic membrane normal.     Nose: Nose normal.     Mouth/Throat:     Mouth: Mucous membranes are moist.     Pharynx: Oropharynx is clear.  Eyes:     Extraocular Movements: Extraocular movements intact.     Conjunctiva/sclera: Conjunctivae normal.     Pupils: Pupils are equal, round, and reactive to light.  Cardiovascular:     Rate and Rhythm: Normal rate and regular rhythm.     Pulses: Normal pulses.     Heart sounds: Normal heart sounds.  Pulmonary:     Effort: Pulmonary effort is normal. No respiratory distress.     Breath sounds: Normal breath sounds. No rhonchi.  Abdominal:     General: Bowel sounds are normal.     Palpations: Abdomen is soft. There is no mass.     Tenderness: There is no abdominal tenderness.     Hernia: No hernia is present.  Musculoskeletal:        General: Normal range of motion.     Cervical back: Neck supple. No tenderness.  Skin:    General: Skin is warm.     Capillary Refill: Capillary refill takes less than 2 seconds.  Neurological:     General: No focal deficit present.     Mental Status: She is alert and oriented to person, place, and time. Mental status is at baseline.  Psychiatric:        Mood and Affect: Mood normal.        Behavior: Behavior normal.        Thought Content: Thought content normal.        Judgment: Judgment normal.     BP 132/82   Pulse 88   Ht '5\' 3"'$  (1.6 m)   Wt 154 lb 9 oz (70.1 kg)   BMI 27.38 kg/m  Wt Readings from Last 3 Encounters:  11/08/21 154 lb 9 oz (70.1 kg)  08/02/21 154 lb 9.6 oz (70.1 kg)  07/26/21 153 lb (69.4 kg)     Health Maintenance  Topic Date Due   HIV Screening  Never done   Hepatitis C Screening  Never  done   COVID-19 Vaccine (4 - Pfizer risk series) 10/19/2019   INFLUENZA VACCINE  10/08/2021   MAMMOGRAM  01/03/2023   TETANUS/TDAP  09/21/2024   PAP SMEAR-Modifier  01/09/2026   COLONOSCOPY (Pts 45-43yr Insurance coverage  will need to be confirmed)  04/13/2030   HPV VACCINES  Aged Out   Zoster Vaccines- Shingrix  Discontinued    There are no preventive care reminders to display for this patient.  Lab Results  Component Value Date   TSH 1.110 11/13/2021   Lab Results  Component Value Date   WBC 7.6 11/13/2021   HGB 14.3 11/13/2021   HCT 41.9 11/13/2021   MCV 95 11/13/2021   PLT 316 11/13/2021   Lab Results  Component Value Date   NA 136 12/17/2020   K 3.8 12/17/2020   CO2 27 12/17/2020   GLUCOSE 80 12/17/2020   BUN 8 12/17/2020   CREATININE 0.46 12/17/2020   BILITOT 1.1 12/17/2020   ALKPHOS 100 12/17/2020   AST 67 (H) 12/17/2020   ALT 29 12/17/2020   PROT 6.8 12/17/2020   ALBUMIN 4.0 12/17/2020   CALCIUM 9.0 12/17/2020   ANIONGAP 14 08/21/2020   GFR 105.36 12/17/2020   Lab Results  Component Value Date   CHOL 185 11/13/2021   Lab Results  Component Value Date   HDL 73 11/13/2021   Lab Results  Component Value Date   LDLCALC 85 11/13/2021   Lab Results  Component Value Date   TRIG 161 (H) 11/13/2021   Lab Results  Component Value Date   CHOLHDL 2.5 11/13/2021   Lab Results  Component Value Date   HGBA1C 5.2 12/17/2020      Assessment & Plan:   Problem List Items Addressed This Visit       Endocrine   Hypothyroid - Primary    Stable at present. Continue levothyroxine 50 mcg.      Relevant Orders   TSH+T4F+T3Free (Completed)     Other   Anxiety disorder    Stable with medication. Continue the current medication.       Relevant Medications   traZODone (DESYREL) 50 MG tablet   Hyperlipidemia    Advised patient to eat low-fat and heart healthy diet. Continue Lipitor 40 mg.       Relevant Orders   CBC with  Differential/Platelet (Completed)   COMPLETE METABOLIC PANEL WITH GFR   Lipid panel (Completed)   Overweight (BMI 25.0-29.9)    Body mass index is 27.38 kg/m. Advised pt to lose weight. Advised patient to avoid trans fat, fatty and fried food. Follow a regular physical activity schedule.            Meds ordered this encounter  Medications   DISCONTD: traZODone (DESYREL) 50 MG tablet    Sig: Take 0.5-1 tablets (25-50 mg total) by mouth at bedtime as needed for sleep.    Dispense:  30 tablet    Refill:  3   traZODone (DESYREL) 50 MG tablet    Sig: Take 0.5-1 tablets (25-50 mg total) by mouth at bedtime as needed for sleep.    Dispense:  30 tablet    Refill:  3   clonazePAM (KLONOPIN) 0.5 MG tablet    Sig: Take 1 tablet (0.5 mg total) by mouth 2 (two) times daily as needed (will cause drowsiness).    Dispense:  60 tablet    Refill:  0     Follow-up: No follow-ups on file.    Theresia Lo, NP

## 2021-11-13 ENCOUNTER — Other Ambulatory Visit: Payer: Self-pay | Admitting: Nurse Practitioner

## 2021-11-13 ENCOUNTER — Other Ambulatory Visit (HOSPITAL_COMMUNITY): Payer: Self-pay

## 2021-11-13 MED ORDER — CLONAZEPAM 0.5 MG PO TABS
0.5000 mg | ORAL_TABLET | Freq: Two times a day (BID) | ORAL | 0 refills | Status: DC | PRN
  Filled 2021-11-13: qty 60, 30d supply, fill #0

## 2021-11-14 ENCOUNTER — Other Ambulatory Visit (HOSPITAL_COMMUNITY): Payer: Self-pay

## 2021-11-14 LAB — LIPID PANEL
Chol/HDL Ratio: 2.5 ratio (ref 0.0–4.4)
Cholesterol, Total: 185 mg/dL (ref 100–199)
HDL: 73 mg/dL (ref >39)
LDL Chol Calc (NIH): 85 mg/dL (ref 0–99)
Triglycerides: 161 mg/dL — ABNORMAL HIGH (ref 0–149)
VLDL Cholesterol Cal: 27 mg/dL (ref 5–40)

## 2021-11-14 LAB — CBC WITH DIFFERENTIAL/PLATELET
Basophils Absolute: 0.1 10*3/uL (ref 0.0–0.2)
Basos: 1 %
EOS (ABSOLUTE): 0.1 10*3/uL (ref 0.0–0.4)
Eos: 2 %
Hematocrit: 41.9 % (ref 34.0–46.6)
Hemoglobin: 14.3 g/dL (ref 11.1–15.9)
Immature Grans (Abs): 0 10*3/uL (ref 0.0–0.1)
Immature Granulocytes: 0 %
Lymphocytes Absolute: 1.9 10*3/uL (ref 0.7–3.1)
Lymphs: 25 %
MCH: 32.3 pg (ref 26.6–33.0)
MCHC: 34.1 g/dL (ref 31.5–35.7)
MCV: 95 fL (ref 79–97)
Monocytes Absolute: 0.6 10*3/uL (ref 0.1–0.9)
Monocytes: 7 %
Neutrophils Absolute: 5 10*3/uL (ref 1.4–7.0)
Neutrophils: 65 %
Platelets: 316 10*3/uL (ref 150–450)
RBC: 4.43 x10E6/uL (ref 3.77–5.28)
RDW: 11.8 % (ref 11.7–15.4)
WBC: 7.6 10*3/uL (ref 3.4–10.8)

## 2021-11-14 LAB — TSH+T4F+T3FREE
Free T4: 1.82 ng/dL — ABNORMAL HIGH (ref 0.82–1.77)
T3, Free: 2.6 pg/mL (ref 2.0–4.4)
TSH: 1.11 u[IU]/mL (ref 0.450–4.500)

## 2021-11-14 MED ORDER — CLONAZEPAM 0.5 MG PO TABS: 0.5000 mg | ORAL_TABLET | Freq: Two times a day (BID) | ORAL | 0 refills | Status: DC | PRN

## 2021-11-20 ENCOUNTER — Encounter: Payer: Self-pay | Admitting: Nurse Practitioner

## 2021-11-20 DIAGNOSIS — E663 Overweight: Secondary | ICD-10-CM | POA: Insufficient documentation

## 2021-11-20 NOTE — Assessment & Plan Note (Signed)
Stable at present. Continue levothyroxine 50 mcg.

## 2021-11-20 NOTE — Assessment & Plan Note (Signed)
Stable with medication. Continue the current medication.

## 2021-11-20 NOTE — Assessment & Plan Note (Signed)
Body mass index is 27.38 kg/m. Advised pt to lose weight. Advised patient to avoid trans fat, fatty and fried food. Follow a regular physical activity schedule.

## 2021-11-20 NOTE — Assessment & Plan Note (Signed)
Advised patient to eat low-fat and heart healthy diet. Continue Lipitor 40 mg.

## 2021-12-12 ENCOUNTER — Encounter: Payer: Self-pay | Admitting: Nurse Practitioner

## 2021-12-13 ENCOUNTER — Other Ambulatory Visit: Payer: Self-pay

## 2021-12-13 ENCOUNTER — Other Ambulatory Visit: Payer: Self-pay | Admitting: *Deleted

## 2021-12-13 MED ORDER — LEVOTHYROXINE SODIUM 50 MCG PO TABS
50.0000 ug | ORAL_TABLET | Freq: Every morning | ORAL | 2 refills | Status: DC
  Filled 2021-12-13 – 2021-12-16 (×4): qty 30, 30d supply, fill #0
  Filled 2022-01-09: qty 30, 30d supply, fill #1

## 2021-12-16 ENCOUNTER — Other Ambulatory Visit (HOSPITAL_COMMUNITY): Payer: Self-pay

## 2021-12-16 ENCOUNTER — Other Ambulatory Visit: Payer: Self-pay

## 2021-12-24 ENCOUNTER — Other Ambulatory Visit (HOSPITAL_COMMUNITY): Payer: Self-pay

## 2021-12-24 ENCOUNTER — Other Ambulatory Visit: Payer: Self-pay | Admitting: Internal Medicine

## 2021-12-24 MED ORDER — IBUPROFEN 800 MG PO TABS
800.0000 mg | ORAL_TABLET | Freq: Every day | ORAL | 0 refills | Status: DC | PRN
  Filled 2021-12-24: qty 30, 30d supply, fill #0

## 2021-12-30 ENCOUNTER — Other Ambulatory Visit: Payer: Self-pay | Admitting: Nurse Practitioner

## 2021-12-30 ENCOUNTER — Other Ambulatory Visit: Payer: Self-pay | Admitting: Internal Medicine

## 2022-01-02 ENCOUNTER — Encounter: Payer: Self-pay | Admitting: Nurse Practitioner

## 2022-01-02 ENCOUNTER — Other Ambulatory Visit: Payer: Self-pay | Admitting: Nurse Practitioner

## 2022-01-02 ENCOUNTER — Encounter: Payer: Self-pay | Admitting: Plastic Surgery

## 2022-01-02 ENCOUNTER — Ambulatory Visit (INDEPENDENT_AMBULATORY_CARE_PROVIDER_SITE_OTHER): Payer: No Typology Code available for payment source | Admitting: Plastic Surgery

## 2022-01-02 ENCOUNTER — Institutional Professional Consult (permissible substitution): Payer: Self-pay | Admitting: Plastic Surgery

## 2022-01-02 VITALS — BP 124/84 | HR 77 | Ht 63.0 in | Wt 155.2 lb

## 2022-01-02 DIAGNOSIS — F419 Anxiety disorder, unspecified: Secondary | ICD-10-CM

## 2022-01-02 DIAGNOSIS — Z1231 Encounter for screening mammogram for malignant neoplasm of breast: Secondary | ICD-10-CM

## 2022-01-02 DIAGNOSIS — Z9882 Breast implant status: Secondary | ICD-10-CM

## 2022-01-02 DIAGNOSIS — Z719 Counseling, unspecified: Secondary | ICD-10-CM

## 2022-01-02 DIAGNOSIS — E785 Hyperlipidemia, unspecified: Secondary | ICD-10-CM

## 2022-01-02 NOTE — Progress Notes (Signed)
Patient ID: Kristi Carter, female    DOB: Aug 20, 1960, 61 y.o.   MRN: [id number]   Chief Complaint  Patient presents with   Advice Only   Breast Problem    The patient is a 61 year old female here for evaluation of her breasts.  She is 5 feet 3 inches tall and weighs 155 pounds.  23 years ago she had saline smooth implants placed by Dr. Rozanna Box.  They were placed through an axillary incision and she thinks that they are under the muscle.  She is not having any trouble with them but knows that they have been in longer than usual.  She was in a cup prior to the implants and she is now a DD cup.  She does not have diabetes and she is not a smoker.  Her last mammogram was in 2022 here at [facility].  She has grade 3 ptosis and I am guessing around 250-300 cc implants    Review of Systems  Constitutional: Negative.   HENT: Negative.    Eyes: Negative.   Respiratory: Negative.  Negative for chest tightness and shortness of breath.   Cardiovascular: Negative.  Negative for leg swelling.  Gastrointestinal: Negative.   Endocrine: Negative.   Genitourinary: Negative.   Musculoskeletal:  Positive for back pain and neck pain.  Skin: Negative.   Hematological: Negative.   Psychiatric/Behavioral: Negative.      Past Medical History:  Diagnosis Date   A-fib [facility])    Allergy    Antibiotic-induced yeast infection 07/20/2019   Anxiety    GERD (gastroesophageal reflux disease)    Hypothyroid     Past Surgical History:  Procedure Laterality Date   AUGMENTATION MAMMAPLASTY Bilateral 1990   saline   BRAIN SURGERY     CARDIAC ELECTROPHYSIOLOGY MAPPING AND ABLATION     COLONOSCOPY WITH PROPOFOL N/A 04/13/2020   Procedure: COLONOSCOPY WITH PROPOFOL;  Surgeon: [name], MD;  Location: [facility] ENDOSCOPY;  Service: Gastroenterology;  Laterality: N/A;   ESOPHAGOGASTRODUODENOSCOPY (EGD) WITH PROPOFOL N/A 04/13/2020   Procedure: ESOPHAGOGASTRODUODENOSCOPY (EGD) WITH PROPOFOL;  Surgeon: [name], MD;   Location: [facility] ENDOSCOPY;  Service: Gastroenterology;  Laterality: N/A;   ESOPHAGOGASTRODUODENOSCOPY (EGD) WITH PROPOFOL N/A 07/16/2020   Procedure: ESOPHAGOGASTRODUODENOSCOPY (EGD) WITH PROPOFOL;  Surgeon: [name], MD;  Location: [facility] ENDOSCOPY;  Service: Gastroenterology;  Laterality: N/A;      Current Outpatient Medications:    atorvastatin (LIPITOR) 40 MG tablet, TAKE 1 TABLET BY MOUTH ONCE DAILY, Disp: 90 tablet, Rfl: 1   clonazePAM (KLONOPIN) 0.5 MG tablet, Take 1 tablet (0.5 mg total) by mouth 2 (two) times daily as needed (will cause drowsiness)., Disp: 60 tablet, Rfl: 0   clonazePAM (KLONOPIN) 0.5 MG tablet, Take 1 tablet by mouth 2 times daily as needed (will cause drowsiness), Disp: 60 tablet, Rfl: 0   esomeprazole (NEXIUM) 40 MG capsule, TAKE 1 CAPSULE BY MOUTH ONCE A DAY, Disp: 90 capsule, Rfl: 3   ibuprofen (ADVIL) 800 MG tablet, Take 1 tablet (800 mg total) by mouth daily as needed., Disp: 30 tablet, Rfl: 0   levothyroxine (SYNTHROID) 50 MCG tablet, Take 1 tablet (50 mcg total) by mouth every morning., Disp: 30 tablet, Rfl: 2   Omega-3 Fatty Acids (FISH OIL) 300 MG CAPS, Take by mouth., Disp: , Rfl:    traZODone (DESYREL) 50 MG tablet, Take 0.5-1 tablets (25-50 mg total) by mouth at bedtime as needed for sleep., Disp: 30 tablet, Rfl: 3   Objective:   Vitals:  01/02/22 1301  BP: 124/84  Pulse: 77  SpO2: 98%    Physical Exam Vitals reviewed.  Constitutional:      Appearance: Normal appearance.  HENT:     Head: Normocephalic and atraumatic.  Cardiovascular:     Rate and Rhythm: Normal rate.     Pulses: Normal pulses.  Pulmonary:     Effort: Pulmonary effort is normal. No respiratory distress.  Abdominal:     General: There is no distension.     Palpations: Abdomen is soft.     Tenderness: There is no abdominal tenderness.  Musculoskeletal:        General: No swelling or deformity.  Skin:    General: Skin is warm.     Capillary Refill: Capillary refill takes  less than 2 seconds.     Coloration: Skin is not jaundiced.     Findings: No bruising or lesion.  Neurological:     Mental Status: She is alert and oriented to person, place, and time.  Psychiatric:        Mood and Affect: Mood normal.        Behavior: Behavior normal.        Thought Content: Thought content normal.     Assessment & Plan:  Hyperlipidemia, unspecified hyperlipidemia type  History of bilateral breast implants  Anxiety disorder, unspecified type  Encounter for counseling  Her mammogram in October 2022 was negative and results noted in the chart.  She would need another mammogram prior to surgery.  Patient would like a quote for removal of bilateral breast implants, replacement and mastopexy.  If she decides to have new implants placed she would like them to be saline again.   Pictures were obtained of the patient and placed in the chart with the patient's or guardian's permission.   [place], DO

## 2022-01-03 ENCOUNTER — Institutional Professional Consult (permissible substitution): Payer: No Typology Code available for payment source | Admitting: Plastic Surgery

## 2022-01-03 ENCOUNTER — Encounter: Payer: Self-pay | Admitting: Plastic Surgery

## 2022-01-03 ENCOUNTER — Telehealth: Payer: Self-pay

## 2022-01-03 NOTE — Telephone Encounter (Signed)
Emailed from printer release of information form to [email or web address], office of Dr. Luvenia Heller (plastic surgery office in [place]). Per rep at their office, they don't have a fax # and requested that I e-mail the form.

## 2022-01-07 ENCOUNTER — Encounter: Payer: Self-pay | Admitting: Plastic Surgery

## 2022-01-07 ENCOUNTER — Encounter: Payer: Self-pay | Admitting: *Deleted

## 2022-01-09 ENCOUNTER — Other Ambulatory Visit: Payer: Self-pay | Admitting: Nurse Practitioner

## 2022-01-09 ENCOUNTER — Other Ambulatory Visit (HOSPITAL_COMMUNITY): Payer: Self-pay

## 2022-01-09 DIAGNOSIS — E785 Hyperlipidemia, unspecified: Secondary | ICD-10-CM

## 2022-01-09 MED ORDER — ATORVASTATIN CALCIUM 40 MG PO TABS
40.0000 mg | ORAL_TABLET | Freq: Every day | ORAL | 1 refills | Status: DC
Start: 2022-01-09 — End: 2022-04-16
  Filled 2022-01-09: qty 30, 30d supply, fill #0
  Filled 2022-03-11: qty 30, 30d supply, fill #1

## 2022-01-10 ENCOUNTER — Other Ambulatory Visit (HOSPITAL_COMMUNITY): Payer: Self-pay

## 2022-01-16 ENCOUNTER — Encounter: Payer: Self-pay | Admitting: *Deleted

## 2022-01-24 ENCOUNTER — Ambulatory Visit
Admission: RE | Admit: 2022-01-24 | Discharge: 2022-01-24 | Disposition: A | Discharge status: Home / Self Care | Payer: No Typology Code available for payment source | Source: Ambulatory Visit

## 2022-01-24 DIAGNOSIS — Z1231 Encounter for screening mammogram for malignant neoplasm of breast: Secondary | ICD-10-CM

## 2022-02-04 ENCOUNTER — Ambulatory Visit (INDEPENDENT_AMBULATORY_CARE_PROVIDER_SITE_OTHER): Payer: No Typology Code available for payment source | Admitting: Internal Medicine

## 2022-02-04 ENCOUNTER — Encounter: Payer: Self-pay | Admitting: Internal Medicine

## 2022-02-04 ENCOUNTER — Encounter: Payer: Self-pay | Admitting: Nurse Practitioner

## 2022-02-04 VITALS — BP 110/70 | HR 75 | Ht 63.0 in | Wt 157.0 lb

## 2022-02-04 DIAGNOSIS — E039 Hypothyroidism, unspecified: Secondary | ICD-10-CM

## 2022-02-04 NOTE — Progress Notes (Unsigned)
Name: Kristi Carter  MRN/ DOB: [id number], 02-21-1961    Age/ Sex: 61 y.o., female    PCP: Theresia Lo, NP   Reason for Endocrinology Evaluation: Hypothyroidism     Date of Initial Endocrinology Evaluation: 02/04/2022     HPI: Kristi Carter is a 61 y.o. female with a past medical history of Dyslipidemia, Barrett's esophagus , Hx of A. Fib S/P cardioversion and Hypothyroidism. The patient presented for initial endocrinology clinic visit on 02/04/2022 for consultative assistance with her Hypothyroidism.   She has been noted with hypothyroidism ~ 10 yrs ago  No prior hx of   She had a thyroid nodule while living in [place] , no prior FNA . Ultrasound in 2020 did NOT show any nodules.   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  Denies palpitations  No Biotin   No Fh of thyroid disease    Levothyroxine 50 mcg daily   HISTORY:  Past Medical History:  Past Medical History:  Diagnosis Date   A-fib ([place])    Allergy    Antibiotic-induced yeast infection 07/20/2019   Anxiety    GERD (gastroesophageal reflux disease)    Hypothyroid    Past Surgical History:  Past Surgical History:  Procedure Laterality Date   AUGMENTATION MAMMAPLASTY Bilateral 1990   saline   BRAIN SURGERY     CARDIAC ELECTROPHYSIOLOGY MAPPING AND ABLATION     COLONOSCOPY WITH PROPOFOL N/A 04/13/2020   Procedure: COLONOSCOPY WITH PROPOFOL;  Surgeon: [name], MD;  Location: [facility] ENDOSCOPY;  Service: Gastroenterology;  Laterality: N/A;   ESOPHAGOGASTRODUODENOSCOPY (EGD) WITH PROPOFOL N/A 04/13/2020   Procedure: ESOPHAGOGASTRODUODENOSCOPY (EGD) WITH PROPOFOL;  Surgeon: [name], MD;  Location: [facility] ENDOSCOPY;  Service: Gastroenterology;  Laterality: N/A;   ESOPHAGOGASTRODUODENOSCOPY (EGD) WITH PROPOFOL N/A 07/16/2020   Procedure: ESOPHAGOGASTRODUODENOSCOPY (EGD) WITH PROPOFOL;  Surgeon: [name], MD;   Location: [facility] ENDOSCOPY;  Service: Gastroenterology;  Laterality: N/A;    Social History:  reports that she has never smoked. She has never used smokeless tobacco. She reports current alcohol use of about 10.0 standard drinks of alcohol per week. She reports that she does not use drugs. Family History: family history includes Cancer in her paternal uncle; Healthy in her father; Heart attack in her paternal grandfather; Stroke in her paternal grandmother.   HOME MEDICATIONS: Allergies as of 02/04/2022       Reactions   Other    Cats, Mold and Feathers   Prednisone    Other reaction(s): Vomiting   Tape         Medication List        Accurate as of February 04, 2022  2:41 PM. If you have any questions, ask your nurse or doctor.          atorvastatin 40 MG tablet Commonly known as: LIPITOR Take 1 tablet (40 mg total) by mouth daily.   clonazePAM 0.5 MG tablet Commonly known as: KLONOPIN Take 1 tablet by mouth 2 times daily as needed (will cause drowsiness)   clonazePAM 0.5 MG tablet Commonly known as: KLONOPIN Take 1 tablet (0.5 mg total) by mouth 2 (two) times daily as needed (will cause drowsiness).   esomeprazole 40 MG capsule Commonly known as: NEXIUM TAKE 1 CAPSULE BY MOUTH ONCE A DAY   Fish Oil 300 MG Caps Take by mouth.   ibuprofen 800 MG tablet Commonly known as: ADVIL  Take 1 tablet (800 mg total) by mouth daily as needed.   levothyroxine 50 MCG tablet Commonly known as: SYNTHROID Take 1 tablet (50 mcg total) by mouth every morning.   traZODone 50 MG tablet Commonly known as: DESYREL Take 0.5-1 tablets (25-50 mg total) by mouth at bedtime as needed for sleep.          REVIEW OF SYSTEMS: A comprehensive ROS was conducted with the patient and is negative except as per HPI    OBJECTIVE:  VS: BP 110/70 (BP Location: Left Arm, Patient Position: Sitting, Cuff Size: Large)   Pulse 75   Ht '5\' 3"'$  (1.6 m)   Wt 157 lb (71.2 kg)   SpO2 99%   BMI  27.81 kg/m    Wt Readings from Last 3 Encounters:  02/04/22 157 lb (71.2 kg)  01/02/22 155 lb 3.2 oz (70.4 kg)  11/08/21 154 lb 9 oz (70.1 kg)     EXAM: General: Pt appears well and is in NAD  Eyes: External eye exam normal without stare, lid lag or exophthalmos.  EOM intact.  Neck: General: Supple without adenopathy. Thyroid: Thyroid size normal.  No goiter or nodules appreciated.   Lungs: Clear with good BS bilat with no rales, rhonchi, or wheezes  Heart: Auscultation: RRR.  Abdomen: Normoactive bowel sounds, soft, nontender, without masses or organomegaly palpable  Extremities:  BL LE: No pretibial edema normal ROM and strength.  Mental Status: Judgment, insight: Intact Orientation: Oriented to time, place, and person Mood and affect: No depression, anxiety, or agitation     DATA REVIEWED:  Latest Reference Range & Units 02/04/22 15:01  TSH 0.35 - 5.50 uIU/mL 0.66      Thyroid Ultrasound 03/19/2018    Parenchymal Echotexture: Mildly heterogenous   Isthmus: 0.4 cm   Right lobe: 3.7 x 0.8 x 1.3 cm   Left lobe: 3.3 x 0.6 x 1.0 cm   _________________________________________________________   Estimated total number of nodules >/= 1 cm: 0   Number of spongiform nodules >/=  2 cm not described below (TR1): 0   Number of mixed cystic and solid nodules >/= 1.5 cm not described below (TR2): 0   _________________________________________________________   No discrete nodules are seen within the thyroid gland.   IMPRESSION: There is no evidence of thyroid nodule. The gland is within normal limits in size.    ASSESSMENT/PLAN/RECOMMENDATIONS:   Hypothyroidism:  - Pt is clinically euthyroid  - Pt educated extensively on the correct way to take levothyroxine (first thing in the morning with water, 30 minutes before eating or taking other medications). - Pt encouraged to double dose the following day if she were to miss a dose given long half-life of  levothyroxine.   Medications : Continue Levothyroxine 50 mcg daily   2. Hx of Thyroid nodule :  -She had a history of a thyroid nodule while living in [place], [place].  Her last thyroid ultrasound in our system was in 2020 and there was no evidence of any thyroid nodules -We discussed monitoring her thyroid clinically   Follow-up in 6 months  Signed electronically by: Mack Guise, MD  [facility] Endocrinology  [address] [place], [place] [address] Phone: [phone] FAX: [phone]   CC: Theresia Lo, NP [place] [address] Phone: [phone] Fax: [phone]   Return to Endocrinology clinic as below: No future appointments.

## 2022-02-04 NOTE — Patient Instructions (Signed)

## 2022-02-05 ENCOUNTER — Other Ambulatory Visit (HOSPITAL_COMMUNITY): Payer: Self-pay

## 2022-02-05 LAB — TSH: TSH: 0.66 u[IU]/mL (ref 0.35–5.50)

## 2022-02-05 MED ORDER — LEVOTHYROXINE SODIUM 50 MCG PO TABS
50.0000 ug | ORAL_TABLET | Freq: Every morning | ORAL | 3 refills | Status: DC
  Filled 2022-02-05: qty 90, 90d supply, fill #0
  Filled 2022-02-07 – 2022-04-05 (×2): qty 90, 90d supply, fill #1
  Filled 2022-08-30 – 2022-09-18 (×2): qty 90, 90d supply, fill #2
  Filled 2022-12-13: qty 90, 90d supply, fill #3

## 2022-02-07 ENCOUNTER — Other Ambulatory Visit: Payer: Self-pay

## 2022-02-07 ENCOUNTER — Other Ambulatory Visit (HOSPITAL_COMMUNITY): Payer: Self-pay

## 2022-02-07 MED ORDER — IBUPROFEN 800 MG PO TABS
800.0000 mg | ORAL_TABLET | Freq: Every day | ORAL | 0 refills | Status: DC | PRN
  Filled 2022-02-07 – 2022-02-12 (×2): qty 90, 90d supply, fill #0

## 2022-02-08 IMAGING — MG DIGITAL SCREENING BREAST BILAT IMPLANT W/ TOMO W/ CAD
8 of 12 series · 8 of 28 positions shown · non-contrast
Comparison: Previous exam(s).

CLINICAL DATA: Screening.

EXAM:
DIGITAL SCREENING BILATERAL MAMMOGRAM WITH IMPLANTS, CAD AND
TOMOSYNTHESIS
TECHNIQUE: Bilateral screening digital craniocaudal and mediolateral oblique
mammograms were obtained. Bilateral screening digital breast
tomosynthesis was performed. The images were evaluated with
computer-aided detection. Standard and/or implant displaced views
were performed.

[L MLO]
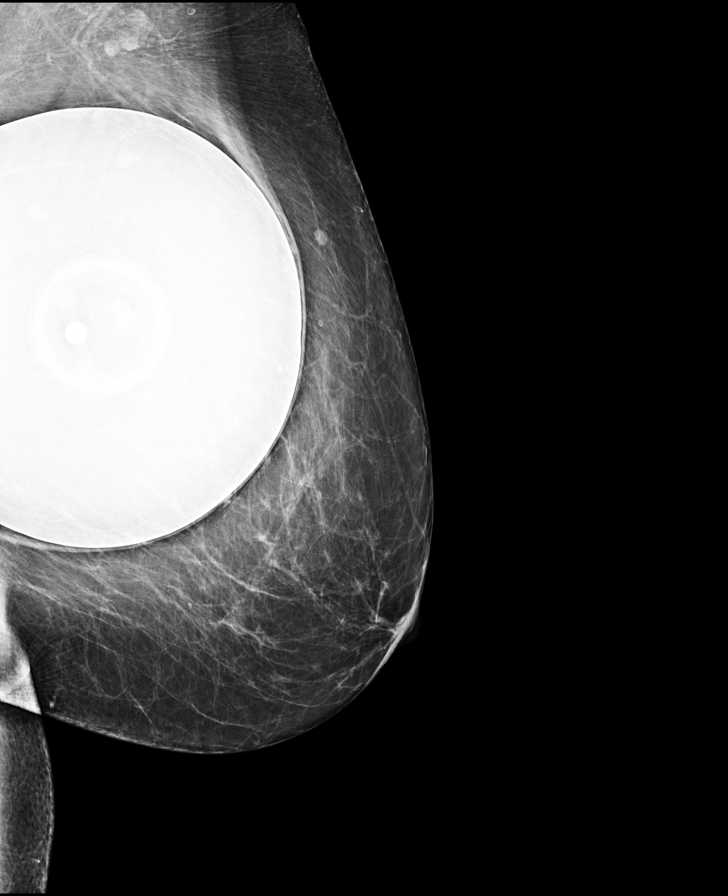

[R CC]
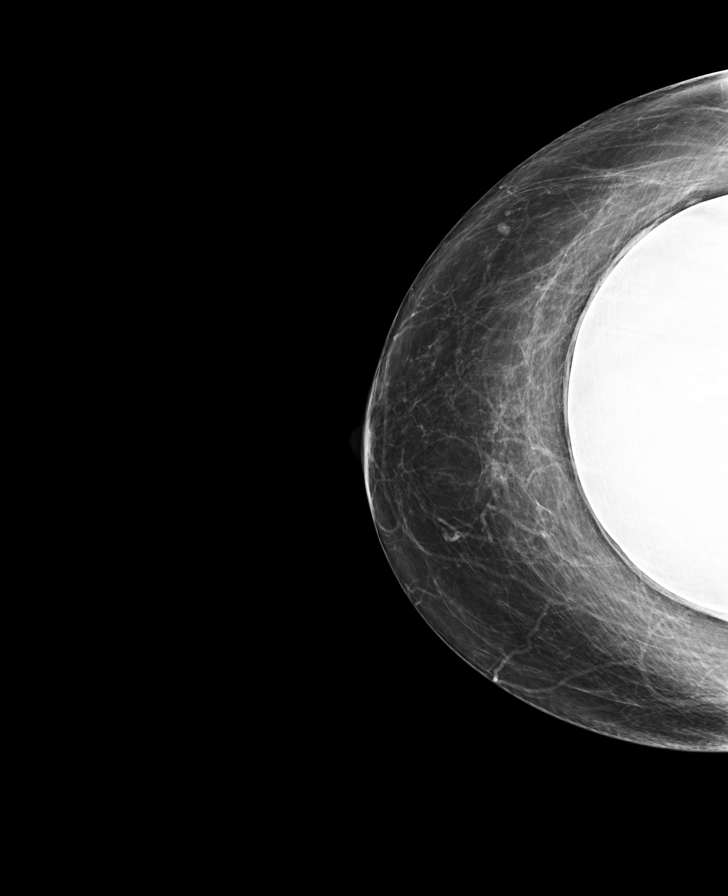

[R MLO]
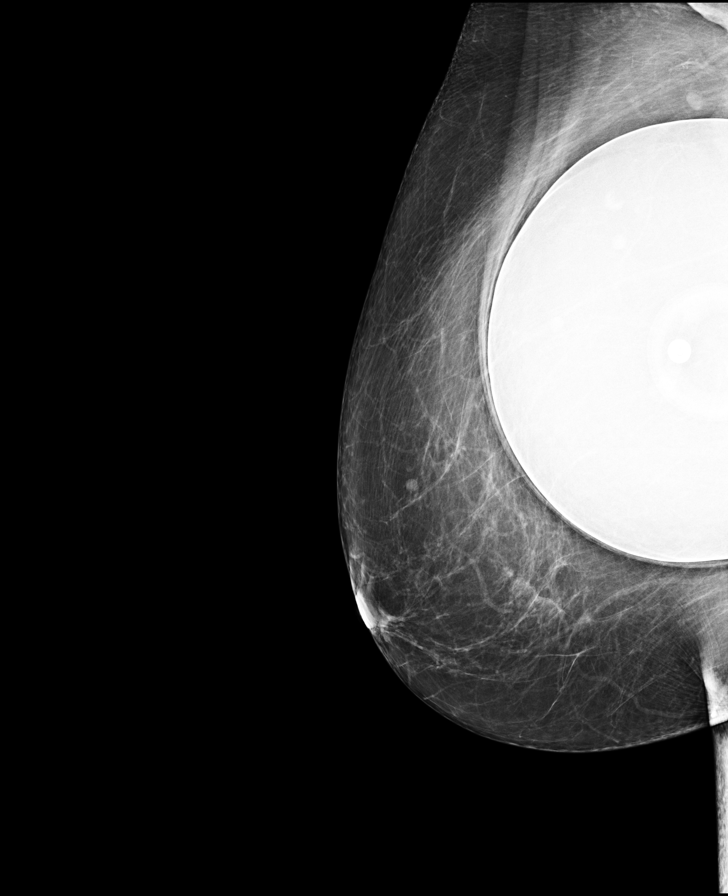

[L CC]
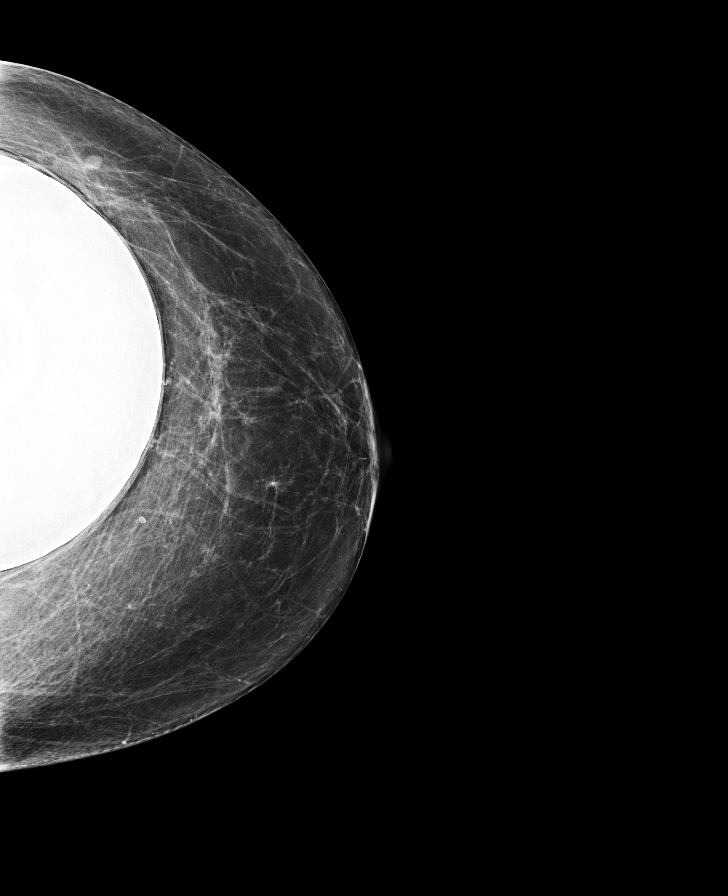

[L MLO synth-2D]
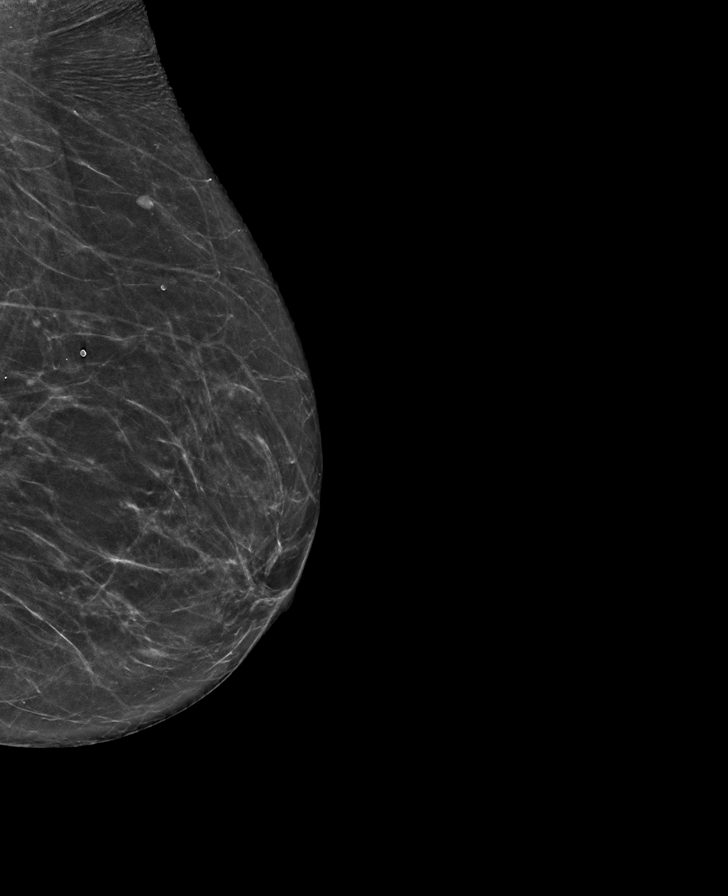

[R CC synth-2D]
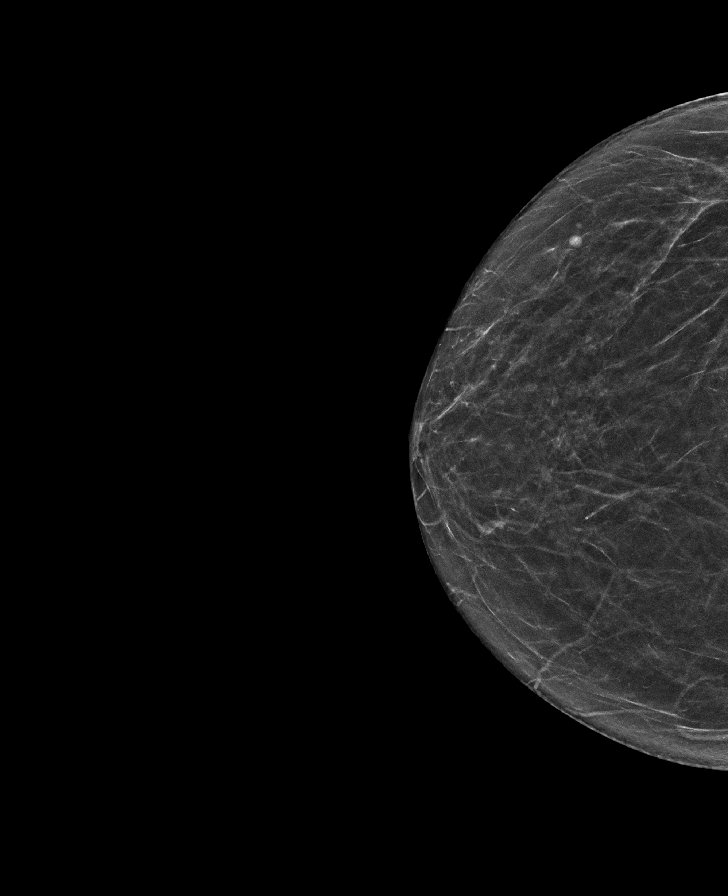

[L CC synth-2D]
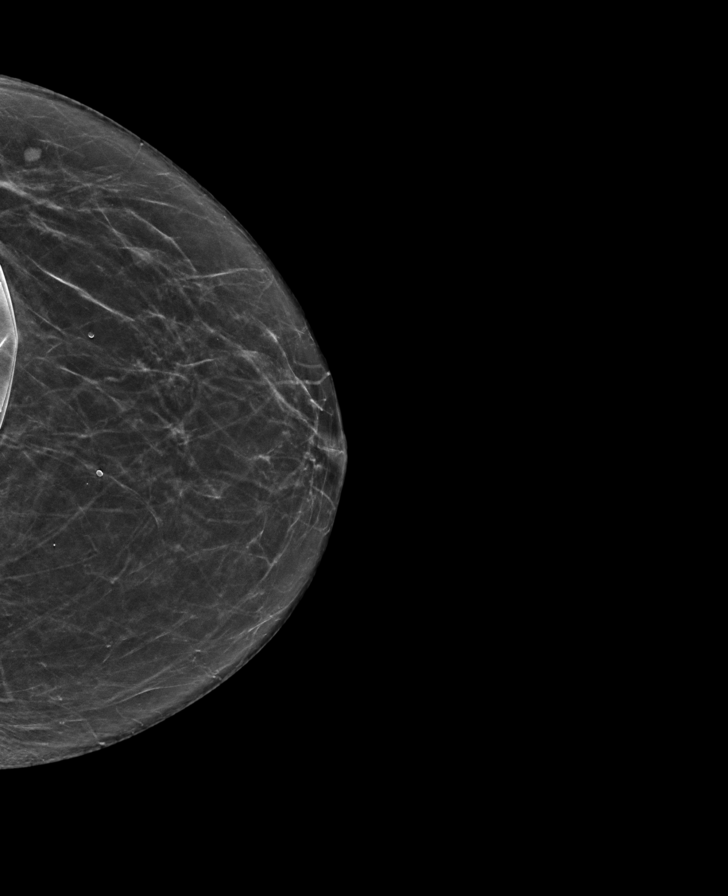

[R MLO synth-2D]
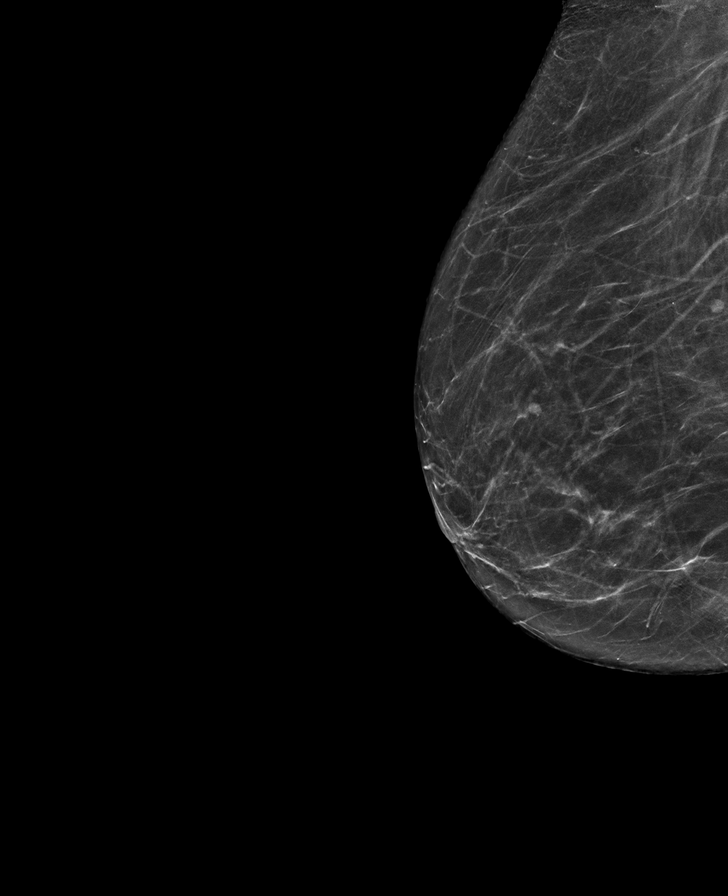

[8 of 28 positions shown; findings below may reference images not displayed]

ACR Breast Density Category c: The breast tissue is heterogeneously
dense, which may obscure small masses.
FINDINGS: The patient has retropectoral saline implants. There are no findings
suspicious for malignancy.
IMPRESSION: No mammographic evidence of malignancy. A result letter of this
screening mammogram will be mailed directly to the patient.

RECOMMENDATION:
Screening mammogram in one year. (Code:FQ-5-EOL)

BI-RADS CATEGORY  1:  Negative.

## 2022-02-12 ENCOUNTER — Other Ambulatory Visit (HOSPITAL_COMMUNITY): Payer: Self-pay

## 2022-02-12 ENCOUNTER — Other Ambulatory Visit: Payer: Self-pay

## 2022-02-13 ENCOUNTER — Other Ambulatory Visit: Payer: Self-pay

## 2022-02-27 ENCOUNTER — Ambulatory Visit (INDEPENDENT_AMBULATORY_CARE_PROVIDER_SITE_OTHER): Payer: No Typology Code available for payment source | Admitting: Nurse Practitioner

## 2022-02-27 ENCOUNTER — Encounter: Payer: Self-pay | Admitting: Nurse Practitioner

## 2022-02-27 VITALS — BP 118/80 | HR 101 | Ht 63.0 in | Wt 160.7 lb

## 2022-02-27 DIAGNOSIS — F419 Anxiety disorder, unspecified: Secondary | ICD-10-CM

## 2022-02-27 DIAGNOSIS — E785 Hyperlipidemia, unspecified: Secondary | ICD-10-CM | POA: Diagnosis not present

## 2022-02-27 DIAGNOSIS — E663 Overweight: Secondary | ICD-10-CM

## 2022-02-27 DIAGNOSIS — E039 Hypothyroidism, unspecified: Secondary | ICD-10-CM | POA: Diagnosis not present

## 2022-02-27 MED ORDER — CLONAZEPAM 0.5 MG PO TABS
0.5000 mg | ORAL_TABLET | Freq: Two times a day (BID) | ORAL | 0 refills | Status: DC | PRN
  Filled 2022-02-27: qty 60, 30d supply, fill #0

## 2022-02-27 NOTE — Progress Notes (Signed)
Established Patient Office Visit  Subjective:  Patient ID: Kristi Carter, female    DOB: Jul 14, 1960  Age: 61 y.o. MRN: [id number]  CC:  Chief Complaint  Patient presents with   Medication Refill    HPI  Kristi Carter presents for routine follow up. She has h/o hypothyroid, GERD, allergy, anxiety,and Afib. She has ablation done 6-7 years ago.     Past Medical History:  Diagnosis Date   A-fib [facility])    Allergy    Antibiotic-induced yeast infection 07/20/2019   Anxiety    GERD (gastroesophageal reflux disease)    Hypothyroid     Past Surgical History:  Procedure Laterality Date   AUGMENTATION MAMMAPLASTY Bilateral 1990   saline   BRAIN SURGERY     CARDIAC ELECTROPHYSIOLOGY MAPPING AND ABLATION     COLONOSCOPY WITH PROPOFOL N/A 04/13/2020   Procedure: COLONOSCOPY WITH PROPOFOL;  Surgeon: [name], MD;  Location: [facility] ENDOSCOPY;  Service: Gastroenterology;  Laterality: N/A;   ESOPHAGOGASTRODUODENOSCOPY (EGD) WITH PROPOFOL N/A 04/13/2020   Procedure: ESOPHAGOGASTRODUODENOSCOPY (EGD) WITH PROPOFOL;  Surgeon: [name], MD;  Location: [facility] ENDOSCOPY;  Service: Gastroenterology;  Laterality: N/A;   ESOPHAGOGASTRODUODENOSCOPY (EGD) WITH PROPOFOL N/A 07/16/2020   Procedure: ESOPHAGOGASTRODUODENOSCOPY (EGD) WITH PROPOFOL;  Surgeon: [name], MD;  Location: [facility] ENDOSCOPY;  Service: Gastroenterology;  Laterality: N/A;    Family History  Problem Relation Age of Onset   Healthy Father    Cancer Paternal Uncle        started with prostate and spread to entire body   Stroke Paternal Grandmother    Heart attack Paternal Grandfather     Social History   Socioeconomic History   Marital status: Married    Spouse name: Not on file   Number of children: Not on file   Years of education: Not on file   Highest education level: Not on file  Occupational History   Not on file  Tobacco Use   Smoking status: Never   Smokeless tobacco: Never  Vaping Use   Vaping Use: Never  used  Substance and Sexual Activity   Alcohol use: Yes    Alcohol/week: 10.0 standard drinks of alcohol    Types: 10 Glasses of wine per week   Drug use: Never   Sexual activity: Yes    Birth control/protection: Post-menopausal  Other Topics Concern   Not on file  Social History Narrative   Not on file   Social Determinants of Health   Financial Resource Strain: Not on file  Food Insecurity: Not on file  Transportation Needs: Not on file  Physical Activity: Not on file  Stress: Not on file  Social Connections: Not on file  Intimate Partner Violence: Not on file     Outpatient Medications Prior to Visit  Medication Sig Dispense Refill   atorvastatin (LIPITOR) 40 MG tablet Take 1 tablet (40 mg total) by mouth daily. 30 tablet 1   esomeprazole (NEXIUM) 40 MG capsule TAKE 1 CAPSULE BY MOUTH ONCE A DAY 90 capsule 3   ibuprofen (ADVIL) 800 MG tablet Take 1 tablet (800 mg total) by mouth daily as needed. 90 tablet 0   levothyroxine (SYNTHROID) 50 MCG tablet Take 1 tablet (50 mcg total) by mouth every morning. 90 tablet 3   clonazePAM (KLONOPIN) 0.5 MG tablet Take 1 tablet (0.5 mg total) by mouth 2 (two) times daily as needed (will cause drowsiness). 60 tablet 0   traZODone (DESYREL) 50 MG tablet Take 50 mg by mouth at bedtime. (  Patient not taking: Reported on 02/27/2022)     clonazePAM (KLONOPIN) 0.5 MG tablet Take 1 tablet by mouth 2 times daily as needed (will cause drowsiness) 60 tablet 0   Omega-3 Fatty Acids (FISH OIL) 300 MG CAPS Take by mouth. (Patient not taking: Reported on 02/04/2022)     traZODone (DESYREL) 50 MG tablet Take 0.5-1 tablets (25-50 mg total) by mouth at bedtime as needed for sleep. (Patient not taking: Reported on 02/04/2022) 30 tablet 3   No facility-administered medications prior to visit.    Allergies  Allergen Reactions   Other     Cats, Mold and Feathers   Prednisone     Other reaction(s): Vomiting   Tape     ROS Review of Systems   Constitutional: Negative.   HENT: Negative.    Eyes: Negative.   Respiratory:  Negative for chest tightness and shortness of breath.   Cardiovascular:  Negative for leg swelling.  Gastrointestinal:  Positive for diarrhea.  Genitourinary: Negative.   Musculoskeletal: Negative.   Neurological: Negative.   Psychiatric/Behavioral: Negative.        Objective:    Physical Exam Constitutional:      Appearance: Normal appearance. She is obese.  HENT:     Head: Normocephalic.     Right Ear: Tympanic membrane normal.     Left Ear: Tympanic membrane normal.     Nose: Nose normal.     Mouth/Throat:     Mouth: Mucous membranes are moist.     Pharynx: Oropharynx is clear.  Eyes:     Extraocular Movements: Extraocular movements intact.     Conjunctiva/sclera: Conjunctivae normal.     Pupils: Pupils are equal, round, and reactive to light.  Cardiovascular:     Rate and Rhythm: Normal rate and regular rhythm.     Pulses: Normal pulses.     Heart sounds: Normal heart sounds.  Pulmonary:     Effort: Pulmonary effort is normal. No respiratory distress.     Breath sounds: Normal breath sounds. No rhonchi.  Abdominal:     General: Bowel sounds are normal.     Palpations: Abdomen is soft. There is no mass.     Tenderness: There is no abdominal tenderness.     Hernia: No hernia is present.  Musculoskeletal:        General: Normal range of motion.     Cervical back: Neck supple. No tenderness.  Skin:    General: Skin is warm.     Capillary Refill: Capillary refill takes less than 2 seconds.  Neurological:     General: No focal deficit present.     Mental Status: She is alert and oriented to person, place, and time. Mental status is at baseline.  Psychiatric:        Mood and Affect: Mood normal.        Behavior: Behavior normal.        Thought Content: Thought content normal.        Judgment: Judgment normal.     BP 118/80   Pulse (!) 101   Ht '5\' 3"'$  (1.6 m)   Wt 160 lb 11.2 oz  (72.9 kg)   SpO2 99%   BMI 28.47 kg/m  Wt Readings from Last 3 Encounters:  02/27/22 160 lb 11.2 oz (72.9 kg)  02/04/22 157 lb (71.2 kg)  01/02/22 155 lb 3.2 oz (70.4 kg)     Health Maintenance  Topic Date Due   HIV Screening  Never done  Hepatitis C Screening  Never done   COVID-19 Vaccine (4 - 2023-24 season) 11/08/2021   MAMMOGRAM  01/25/2024   DTaP/Tdap/Td (2 - Td or Tdap) 09/21/2024   PAP SMEAR-Modifier  01/09/2026   COLONOSCOPY (Pts 45-24yr Insurance coverage will need to be confirmed)  04/13/2030   INFLUENZA VACCINE  Completed   HPV VACCINES  Aged Out   Zoster Vaccines- Shingrix  Discontinued    There are no preventive care reminders to display for this patient.  Lab Results  Component Value Date   TSH 0.66 02/04/2022   Lab Results  Component Value Date   WBC 7.6 11/13/2021   HGB 14.3 11/13/2021   HCT 41.9 11/13/2021   MCV 95 11/13/2021   PLT 316 11/13/2021   Lab Results  Component Value Date   NA 136 12/17/2020   K 3.8 12/17/2020   CO2 27 12/17/2020   GLUCOSE 80 12/17/2020   BUN 8 12/17/2020   CREATININE 0.46 12/17/2020   BILITOT 1.1 12/17/2020   ALKPHOS 100 12/17/2020   AST 67 (H) 12/17/2020   ALT 29 12/17/2020   PROT 6.8 12/17/2020   ALBUMIN 4.0 12/17/2020   CALCIUM 9.0 12/17/2020   ANIONGAP 14 08/21/2020   GFR 105.36 12/17/2020   Lab Results  Component Value Date   CHOL 185 11/13/2021   Lab Results  Component Value Date   HDL 73 11/13/2021   Lab Results  Component Value Date   LDLCALC 85 11/13/2021   Lab Results  Component Value Date   TRIG 161 (H) 11/13/2021   Lab Results  Component Value Date   CHOLHDL 2.5 11/13/2021   Lab Results  Component Value Date   HGBA1C 5.2 12/17/2020      Assessment & Plan:   Problem List Items Addressed This Visit       Endocrine   Hypothyroid - Primary    Stable on medication. Continue levothyroxine 50 mcg half an hour before breakfast daily.        Other   Anxiety disorder     Stable on medication. Refilled Klonopin      Relevant Medications   traZODone (DESYREL) 50 MG tablet   Hyperlipidemia    Triglyceride 161 on 11/13/2021. Cut down on fried, fatty food and consume heart healthy diet.   Continue Lipitor 40 mg daily        Meds ordered this encounter  Medications   clonazePAM (KLONOPIN) 0.5 MG tablet    Sig: Take 1 tablet by mouth 2 times daily as needed (will cause drowsiness)    Dispense:  60 tablet    Refill:  0     Follow-up: No follow-ups on file.    CTheresia Lo NP

## 2022-02-28 ENCOUNTER — Other Ambulatory Visit: Payer: Self-pay

## 2022-02-28 ENCOUNTER — Other Ambulatory Visit (HOSPITAL_COMMUNITY): Payer: Self-pay

## 2022-03-01 ENCOUNTER — Encounter: Payer: Self-pay | Admitting: Nurse Practitioner

## 2022-03-01 NOTE — Assessment & Plan Note (Signed)
Stable on medication. Refilled Klonopin

## 2022-03-01 NOTE — Assessment & Plan Note (Signed)
Stable on medication. Continue levothyroxine 50 mcg half an hour before breakfast daily.

## 2022-03-01 NOTE — Assessment & Plan Note (Signed)
Triglyceride 161 on 11/13/2021. Cut down on fried, fatty food and consume heart healthy diet.   Continue Lipitor 40 mg daily

## 2022-03-11 ENCOUNTER — Other Ambulatory Visit: Payer: Self-pay

## 2022-03-11 ENCOUNTER — Other Ambulatory Visit (HOSPITAL_COMMUNITY): Payer: Self-pay

## 2022-03-11 MED ORDER — HYDROCODONE-ACETAMINOPHEN 5-325 MG PO TABS
1.0000 | ORAL_TABLET | ORAL | 0 refills | Status: DC | PRN
  Filled 2022-03-11: qty 5, 1d supply, fill #0

## 2022-03-12 ENCOUNTER — Other Ambulatory Visit (HOSPITAL_COMMUNITY): Payer: Self-pay

## 2022-03-12 ENCOUNTER — Other Ambulatory Visit: Payer: Self-pay

## 2022-03-17 ENCOUNTER — Encounter: Payer: Self-pay | Admitting: Nurse Practitioner

## 2022-03-17 ENCOUNTER — Telehealth: Payer: 59 | Admitting: Emergency Medicine

## 2022-03-17 ENCOUNTER — Other Ambulatory Visit: Payer: Self-pay

## 2022-03-17 DIAGNOSIS — N390 Urinary tract infection, site not specified: Secondary | ICD-10-CM | POA: Diagnosis not present

## 2022-03-17 MED ORDER — CEPHALEXIN 500 MG PO CAPS
500.0000 mg | ORAL_CAPSULE | Freq: Two times a day (BID) | ORAL | 0 refills | Status: DC
  Filled 2022-03-17: qty 14, 7d supply, fill #0

## 2022-03-17 MED ORDER — FLUCONAZOLE 150 MG PO TABS
150.0000 mg | ORAL_TABLET | Freq: Once | ORAL | 0 refills | Status: AC
  Filled 2022-03-17: qty 2, 2d supply, fill #0

## 2022-03-17 NOTE — Progress Notes (Signed)

## 2022-03-17 NOTE — Addendum Note (Signed)
Addended by: Montine Circle B on: 03/17/2022 03:48 PM   Modules accepted: Orders

## 2022-04-05 ENCOUNTER — Other Ambulatory Visit (HOSPITAL_COMMUNITY): Payer: Self-pay

## 2022-04-05 ENCOUNTER — Other Ambulatory Visit: Payer: Self-pay | Admitting: Nurse Practitioner

## 2022-04-05 DIAGNOSIS — E785 Hyperlipidemia, unspecified: Secondary | ICD-10-CM

## 2022-04-07 ENCOUNTER — Other Ambulatory Visit: Payer: Self-pay

## 2022-04-14 ENCOUNTER — Other Ambulatory Visit: Payer: Self-pay | Admitting: Nurse Practitioner

## 2022-04-14 DIAGNOSIS — E785 Hyperlipidemia, unspecified: Secondary | ICD-10-CM

## 2022-04-15 ENCOUNTER — Encounter: Payer: Self-pay | Admitting: Nurse Practitioner

## 2022-04-16 ENCOUNTER — Other Ambulatory Visit: Payer: Self-pay | Admitting: Nurse Practitioner

## 2022-04-16 DIAGNOSIS — E785 Hyperlipidemia, unspecified: Secondary | ICD-10-CM

## 2022-04-17 ENCOUNTER — Other Ambulatory Visit (HOSPITAL_COMMUNITY): Payer: Self-pay

## 2022-04-17 MED ORDER — ATORVASTATIN CALCIUM 40 MG PO TABS
40.0000 mg | ORAL_TABLET | Freq: Every day | ORAL | 1 refills | Status: DC
  Filled 2022-04-17: qty 30, 30d supply, fill #0
  Filled 2022-05-08 – 2022-05-19 (×2): qty 30, 30d supply, fill #1

## 2022-04-18 ENCOUNTER — Other Ambulatory Visit: Payer: Self-pay

## 2022-04-18 ENCOUNTER — Other Ambulatory Visit (HOSPITAL_COMMUNITY): Payer: Self-pay

## 2022-04-18 ENCOUNTER — Telehealth: Payer: 59 | Admitting: Nurse Practitioner

## 2022-04-18 DIAGNOSIS — R42 Dizziness and giddiness: Secondary | ICD-10-CM

## 2022-04-18 MED ORDER — MECLIZINE HCL 25 MG PO TABS
25.0000 mg | ORAL_TABLET | Freq: Three times a day (TID) | ORAL | 0 refills | Status: DC | PRN
  Filled 2022-04-18: qty 30, 10d supply, fill #0

## 2022-04-18 NOTE — Progress Notes (Signed)
E Visit for Motion Sickness/ Vertigo  We are sorry that you are not feeling well. Here is how we plan to help!  Based on what you have shared with me it looks like you have symptoms of vertigo  I have prescribed a medication that will help prevent or alleviate your symptoms:  Meclizine 36m by mouth three times per day as needed for nausea/motion sickness [place] [place]   The other thing you can add is Debrox over the counter ear drops to help loosen any wax that may have built up since December.     GET HELP RIGHT AWAY IF:  Your symptoms do not improve or worsen within 2 days after treatment.  You cannot keep down fluids after trying the medication.  Other associated symptoms such as severe headache, visual field changes, fever, or intractable nausea and vomiting.  MAKE SURE YOU:  Understand these instructions. Will watch your condition. Will get help right away if you are not doing well or get worse.  Thank you for choosing an e-visit.  Your e-visit answers were reviewed by a board certified advanced clinical practitioner to complete your personal care plan. Depending upon the condition, your plan could have included both over the counter or prescription medications.  Please review your pharmacy choice. Be sure that the pharmacy you have chosen is open so that you can pick up your prescription now.  If there is a problem you may message your provider in [place] have [place] prescription routed to another pharmacy.  Your safety is important to [place] If you have drug allergies check your prescription carefully.   For the next 24 hours, you can use MyChart to ask questions about today's visit, request a non-urgent call back, or ask for a work or school excuse from your e-visit provider.  You will get an e-mail in the next two days asking about your experience. I hope that your e-visit has been valuable and will speed your recovery.  Meds ordered  this encounter  Medications   meclizine (ANTIVERT) 25 MG tablet    Sig: Take 1 tablet (25 mg total) by mouth 3 (three) times daily as needed for dizziness.    Dispense:  30 tablet    Refill:  0     I spent approximately 5 minutes reviewing the patient's history, current symptoms and coordinating their care today.

## 2022-04-25 ENCOUNTER — Telehealth: Payer: 59 | Admitting: Family Medicine

## 2022-04-25 DIAGNOSIS — B3731 Acute candidiasis of vulva and vagina: Secondary | ICD-10-CM

## 2022-04-25 DIAGNOSIS — N3000 Acute cystitis without hematuria: Secondary | ICD-10-CM

## 2022-04-26 ENCOUNTER — Ambulatory Visit: Payer: Self-pay

## 2022-04-26 ENCOUNTER — Ambulatory Visit: Payer: 59

## 2022-04-26 ENCOUNTER — Telehealth: Payer: 59 | Admitting: Family Medicine

## 2022-04-26 DIAGNOSIS — N3000 Acute cystitis without hematuria: Secondary | ICD-10-CM

## 2022-04-26 MED ORDER — CEPHALEXIN 500 MG PO CAPS: 500.0000 mg | ORAL_CAPSULE | Freq: Two times a day (BID) | ORAL | 0 refills | Status: AC

## 2022-04-26 MED ORDER — FLUCONAZOLE 150 MG PO TABS: 150.0000 mg | ORAL_TABLET | Freq: Once | ORAL | 0 refills | Status: AC

## 2022-04-26 MED ORDER — FLUCONAZOLE 150 MG PO TABS: 150.0000 mg | ORAL_TABLET | Freq: Once | ORAL | 0 refills | Status: DC

## 2022-04-26 MED ORDER — CEPHALEXIN 500 MG PO CAPS
500.0000 mg | ORAL_CAPSULE | Freq: Two times a day (BID) | ORAL | 0 refills | Status: DC
  Filled 2022-04-26: qty 14, 7d supply, fill #0

## 2022-04-26 NOTE — Addendum Note (Signed)
Addended by: Geryl Councilman on: 04/26/2022 12:25 PM   Modules accepted: Orders

## 2022-04-26 NOTE — Addendum Note (Signed)
Addended by: Dellia Nims on: 04/26/2022 11:29 AM   Modules accepted: Orders

## 2022-04-26 NOTE — Progress Notes (Signed)

## 2022-04-26 NOTE — Progress Notes (Signed)
Duplicate encounter--DWB

## 2022-04-26 NOTE — Progress Notes (Signed)
Duplicate encounter-DWB

## 2022-04-26 NOTE — Addendum Note (Signed)
Addended by: Dellia Nims on: 04/26/2022 02:13 PM   Modules accepted: Orders

## 2022-04-27 ENCOUNTER — Other Ambulatory Visit: Payer: Self-pay

## 2022-04-29 ENCOUNTER — Encounter: Payer: Self-pay | Admitting: Obstetrics and Gynecology

## 2022-04-29 ENCOUNTER — Ambulatory Visit (INDEPENDENT_AMBULATORY_CARE_PROVIDER_SITE_OTHER): Payer: 59 | Admitting: Obstetrics and Gynecology

## 2022-04-29 ENCOUNTER — Other Ambulatory Visit (HOSPITAL_COMMUNITY): Payer: Self-pay

## 2022-04-29 VITALS — BP 126/80 | Ht 63.0 in | Wt 159.0 lb

## 2022-04-29 DIAGNOSIS — R399 Unspecified symptoms and signs involving the genitourinary system: Secondary | ICD-10-CM | POA: Diagnosis not present

## 2022-04-29 LAB — POCT URINALYSIS DIPSTICK
Bilirubin, UA: NEGATIVE
Blood, UA: NEGATIVE
Glucose, UA: NEGATIVE
Leukocytes, UA: NEGATIVE
Spec Grav, UA: 1.015 (ref 1.010–1.025)
pH, UA: 6 (ref 5.0–8.0)

## 2022-04-29 NOTE — Progress Notes (Signed)
Cletis Athens, MD   Chief Complaint  Patient presents with   Urinary Tract Infection    Frequency and pressure urinating since last Friday    HPI:      Ms. Kristi Carter is a 62 y.o. [id number] whose LMP was No LMP recorded. Patient is postmenopausal., presents today for UTI sx. Did evisit 1/24 for sx and was given keflex BID for 7 days. Sx resolved but recurred again last wk with LBP, pelvic pressure, frequency and urgency, and chills. No dysuria or hematuria or fevers. No PMB. Did evisit again and given keflex BID for 7 days again. Sx are now improving but still has some LBP. Also taking AZO for pressure sx. Hx of E. Coli on C&S 5/23 in ED with gross hematuria. Drinking lots of water. Has never seen urology.   Patient Active Problem List   Diagnosis Date Noted   Encounter for counseling 01/02/2022   Overweight (BMI 25.0-29.9) 11/20/2021   Encounter to establish care with new doctor 08/05/2021   Urine frequency 08/02/2021   Cystitis 07/26/2021   Cervical high risk human papillomavirus (HPV) DNA test positive 01/09/2021   Elevated liver enzymes 12/26/2020   History of Barrett's esophagus 03/13/2020   Body mass index 28.0-28.9, adult 01/06/2020   Intestinal polyp 01/03/2020   Skin lesion 01/03/2020   Large breasts 08/26/2019   Back pain 08/26/2019   History of UTI 07/27/2019   Blood dyscrasia- IFE1 positive- multiple myeloma panel mild abnormalities  07/20/2019   Vitamin D deficiency 07/20/2019   Routine physical examination 07/15/2019   Bladder wall thickening 07/15/2019   Bone lesion 07/15/2019   Blood in stool 07/15/2019   Recurrent UTI (urinary tract infection) 07/15/2019   Other fatigue 07/15/2019   Cyst of right ovary- dermoid seen on CT 07/09/2019  07/15/2019   History of bilateral breast implants 07/15/2019   GERD without esophagitis 06/14/2018   Hypothyroid 06/14/2018   Anxiety disorder 10/20/2014   Hyperlipidemia 10/20/2014   Nonulcer dyspepsia 10/20/2014     Past Surgical History:  Procedure Laterality Date   AUGMENTATION MAMMAPLASTY Bilateral 1990   saline   BRAIN SURGERY     CARDIAC ELECTROPHYSIOLOGY MAPPING AND ABLATION     COLONOSCOPY WITH PROPOFOL N/A 04/13/2020   Procedure: COLONOSCOPY WITH PROPOFOL;  Surgeon: [name], MD;  Location: [facility] ENDOSCOPY;  Service: Gastroenterology;  Laterality: N/A;   ESOPHAGOGASTRODUODENOSCOPY (EGD) WITH PROPOFOL N/A 04/13/2020   Procedure: ESOPHAGOGASTRODUODENOSCOPY (EGD) WITH PROPOFOL;  Surgeon: [name], MD;  Location: [facility] ENDOSCOPY;  Service: Gastroenterology;  Laterality: N/A;   ESOPHAGOGASTRODUODENOSCOPY (EGD) WITH PROPOFOL N/A 07/16/2020   Procedure: ESOPHAGOGASTRODUODENOSCOPY (EGD) WITH PROPOFOL;  Surgeon: [name], MD;  Location: [facility] ENDOSCOPY;  Service: Gastroenterology;  Laterality: N/A;    Family History  Problem Relation Age of Onset   Healthy Father    Cancer Paternal Uncle        started with prostate and spread to entire body   Stroke Paternal Grandmother    Heart attack Paternal Grandfather     Social History   Socioeconomic History   Marital status: Married    Spouse name: Not on file   Number of children: Not on file   Years of education: Not on file   Highest education level: Not on file  Occupational History   Not on file  Tobacco Use   Smoking status: Never   Smokeless tobacco: Never  Vaping Use   Vaping Use: Never used  Substance and Sexual Activity   Alcohol  use: Yes    Alcohol/week: 10.0 standard drinks of alcohol    Types: 10 Glasses of wine per week   Drug use: Never   Sexual activity: Not Currently    Birth control/protection: Post-menopausal  Other Topics Concern   Not on file  Social History Narrative   Not on file   Social Determinants of Health   Financial Resource Strain: Not on file  Food Insecurity: Not on file  Transportation Needs: Not on file  Physical Activity: Not on file  Stress: Not on file  Social Connections: Not on file   Intimate Partner Violence: Not on file    Outpatient Medications Prior to Visit  Medication Sig Dispense Refill   atorvastatin (LIPITOR) 40 MG tablet Take 1 tablet (40 mg total) by mouth daily. 30 tablet 1   cephALEXin (KEFLEX) 500 MG capsule Take 1 capsule (500 mg total) by mouth 2 (two) times daily for 7 days. 14 capsule 0   clonazePAM (KLONOPIN) 0.5 MG tablet Take 1 tablet by mouth 2 times daily as needed (will cause drowsiness) 60 tablet 0   esomeprazole (NEXIUM) 40 MG capsule TAKE 1 CAPSULE BY MOUTH ONCE A DAY 90 capsule 3   ibuprofen (ADVIL) 800 MG tablet Take 1 tablet (800 mg total) by mouth daily as needed. 90 tablet 0   levothyroxine (SYNTHROID) 50 MCG tablet Take 1 tablet (50 mcg total) by mouth every morning. 90 tablet 3   meclizine (ANTIVERT) 25 MG tablet Take 1 tablet (25 mg total) by mouth 3 (three) times daily as needed for dizziness. 30 tablet 0   fluconazole (DIFLUCAN) 150 MG tablet PLEASE SEE ATTACHED FOR DETAILED DIRECTIONS (Patient not taking: Reported on 04/29/2022)     HYDROcodone-acetaminophen (NORCO/VICODIN) 5-325 MG tablet Take 1 tablet by mouth every 4 (four) hours as needed for pain. 5 tablet 0   traZODone (DESYREL) 50 MG tablet Take 50 mg by mouth at bedtime. (Patient not taking: Reported on 02/27/2022)     No facility-administered medications prior to visit.      ROS:  Review of Systems  Constitutional:  Negative for fever.  Gastrointestinal:  Negative for blood in stool, constipation, diarrhea, nausea and vomiting.  Genitourinary:  Positive for frequency and urgency. Negative for dyspareunia, dysuria, flank pain, hematuria, vaginal bleeding, vaginal discharge and vaginal pain.  Musculoskeletal:  Positive for back pain.  Skin:  Negative for rash.   BREAST: No symptoms   OBJECTIVE:   Vitals:  BP 126/80   Ht 5' 3"$  (1.6 m)   Wt 159 lb (72.1 kg)   BMI 28.17 kg/m   Physical Exam Vitals reviewed.  Constitutional:      Appearance: She is  well-developed. She is not ill-appearing or toxic-appearing.  Pulmonary:     Effort: Pulmonary effort is normal.  Abdominal:     Tenderness: There is no right CVA tenderness or left CVA tenderness.  Musculoskeletal:        General: Normal range of motion.       Arms:     Cervical back: Normal range of motion.  Neurological:     General: No focal deficit present.     Mental Status: She is alert and oriented to person, place, and time.     Cranial Nerves: No cranial nerve deficit.  Psychiatric:        Behavior: Behavior normal.        Thought Content: Thought content normal.        Judgment: Judgment normal.  Results: Results for orders placed or performed in visit on 04/29/22 (from the past 24 hour(s))  POCT Urinalysis Dipstick     Status: Normal   Collection Time: 04/29/22  9:43 AM  Result Value Ref Range   Color, UA orange    Clarity, UA clear    Glucose, UA Negative Negative   Bilirubin, UA neg    Ketones, UA     Spec Grav, UA 1.015 1.010 - 1.025   Blood, UA neg    pH, UA 6.0 5.0 - 8.0   Protein, UA     Urobilinogen, UA     Nitrite, UA     Leukocytes, UA Negative Negative   Appearance     Odor     PT TAKING AZO  Assessment/Plan: UTI symptoms - Plan: Urine Culture, POCT Urinalysis Dipstick; sx improving, on keflex. Check C&S. Will f/u with results. If abn, will treat with different abx. If neg and sx recur, instructed pt to do in person visit for UA and C&S.   LBP--MSK etiology. Stretch/exercise (sits all day at work).    Return if symptoms worsen or fail to improve.  Firas Guardado B. Cienna Dumais, PA-C 04/29/2022 9:46 AM

## 2022-05-01 DIAGNOSIS — H8111 Benign paroxysmal vertigo, right ear: Secondary | ICD-10-CM | POA: Diagnosis not present

## 2022-05-02 LAB — URINE CULTURE: Organism ID, Bacteria: NO GROWTH

## 2022-05-08 ENCOUNTER — Other Ambulatory Visit: Payer: Self-pay | Admitting: Nurse Practitioner

## 2022-05-09 ENCOUNTER — Other Ambulatory Visit (HOSPITAL_COMMUNITY): Payer: Self-pay

## 2022-05-10 ENCOUNTER — Other Ambulatory Visit (HOSPITAL_COMMUNITY): Payer: Self-pay

## 2022-05-19 ENCOUNTER — Other Ambulatory Visit: Payer: Self-pay

## 2022-05-19 ENCOUNTER — Other Ambulatory Visit: Payer: Self-pay | Admitting: Nurse Practitioner

## 2022-05-21 ENCOUNTER — Other Ambulatory Visit (HOSPITAL_COMMUNITY): Payer: Self-pay

## 2022-05-30 ENCOUNTER — Other Ambulatory Visit: Payer: Self-pay | Admitting: Nurse Practitioner

## 2022-05-30 ENCOUNTER — Other Ambulatory Visit (HOSPITAL_COMMUNITY): Payer: Self-pay

## 2022-05-30 MED ORDER — IBUPROFEN 800 MG PO TABS
800.0000 mg | ORAL_TABLET | Freq: Every day | ORAL | 0 refills | Status: DC | PRN
  Filled 2022-05-30: qty 90, 90d supply, fill #0

## 2022-06-02 ENCOUNTER — Ambulatory Visit: Payer: 59 | Admitting: Obstetrics and Gynecology

## 2022-06-03 ENCOUNTER — Encounter: Payer: Self-pay | Admitting: Nurse Practitioner

## 2022-06-03 DIAGNOSIS — M25472 Effusion, left ankle: Secondary | ICD-10-CM

## 2022-06-08 NOTE — Progress Notes (Unsigned)
PCP: No primary care provider on file.   No chief complaint on file.   HPI:      Kristi Carter is a 62 y.o. No obstetric history on file. whose LMP was No LMP recorded. Patient is postmenopausal., presents today for her annual examination.  Her menses are absent due to menopause (LMP> 10 yrs ago). No PMB. She does have occas vasomotor sx.   Sex activity: occas sexually active. She does not have vaginal dryness.  Last Pap: 01/09/21 Results were no abnormalities/neg HPV DNA. 07/27/19 Results were normal/POS HPV DNA, repeat pap due today; no prior hx of abn paps  UTI sx 2/24 with neg C&S.   Last mammogram: 01/24/22  Results were normal, repeat in 12 months.  There is no FH of breast cancer. There is no FH of ovarian cancer. The patient does self-breast exams.  Colonoscopy: with EGD 07/2020 with Dr. Bailey Mech.  Repeat due after 10 years per pt. Hx of celiac/Barrett's esophagus--repeat EGD due 2 yrs.  Tobacco use: The patient denies current or previous tobacco use. Alcohol use: social No drug use. Exercise: not active  She does get adequate calcium and Vitamin D in her diet. Hx of Vit D deficiency.  Labs with PCP.    Past Medical History:  Diagnosis Date   A-fib [facility])    Allergy    Antibiotic-induced yeast infection 07/20/2019   Anxiety    GERD (gastroesophageal reflux disease)    Hypothyroid     Past Surgical History:  Procedure Laterality Date   AUGMENTATION MAMMAPLASTY Bilateral 1990   saline   BRAIN SURGERY     CARDIAC ELECTROPHYSIOLOGY MAPPING AND ABLATION     COLONOSCOPY WITH PROPOFOL N/A 04/13/2020   Procedure: COLONOSCOPY WITH PROPOFOL;  Surgeon: [name], MD;  Location: [facility] ENDOSCOPY;  Service: Gastroenterology;  Laterality: N/A;   ESOPHAGOGASTRODUODENOSCOPY (EGD) WITH PROPOFOL N/A 04/13/2020   Procedure: ESOPHAGOGASTRODUODENOSCOPY (EGD) WITH PROPOFOL;  Surgeon: [name], MD;  Location: [facility] ENDOSCOPY;  Service: Gastroenterology;  Laterality: N/A;    ESOPHAGOGASTRODUODENOSCOPY (EGD) WITH PROPOFOL N/A 07/16/2020   Procedure: ESOPHAGOGASTRODUODENOSCOPY (EGD) WITH PROPOFOL;  Surgeon: [name], MD;  Location: [facility] ENDOSCOPY;  Service: Gastroenterology;  Laterality: N/A;    Family History  Problem Relation Age of Onset   Healthy Father    Cancer Paternal Uncle        started with prostate and spread to entire body   Stroke Paternal Grandmother    Heart attack Paternal Grandfather     Social History   Socioeconomic History   Marital status: Married    Spouse name: Not on file   Number of children: Not on file   Years of education: Not on file   Highest education level: Not on file  Occupational History   Not on file  Tobacco Use   Smoking status: Never   Smokeless tobacco: Never  Vaping Use   Vaping Use: Never used  Substance and Sexual Activity   Alcohol use: Yes    Alcohol/week: 10.0 standard drinks of alcohol    Types: 10 Glasses of wine per week   Drug use: Never   Sexual activity: Not Currently    Birth control/protection: Post-menopausal  Other Topics Concern   Not on file  Social History Narrative   Not on file   Social Determinants of Health   Financial Resource Strain: Not on file  Food Insecurity: Not on file  Transportation Needs: Not on file  Physical Activity: Not on file  Stress:  Not on file  Social Connections: Not on file  Intimate Partner Violence: Not on file     Current Outpatient Medications:    atorvastatin (LIPITOR) 40 MG tablet, Take 1 tablet (40 mg total) by mouth daily., Disp: 30 tablet, Rfl: 1   clonazePAM (KLONOPIN) 0.5 MG tablet, Take 1 tablet by mouth 2 times daily as needed (will cause drowsiness), Disp: 60 tablet, Rfl: 0   esomeprazole (NEXIUM) 40 MG capsule, TAKE 1 CAPSULE BY MOUTH ONCE A DAY, Disp: 90 capsule, Rfl: 3   fluconazole (DIFLUCAN) 150 MG tablet, PLEASE SEE ATTACHED FOR DETAILED DIRECTIONS (Patient not taking: Reported on 04/29/2022), Disp: , Rfl:    ibuprofen (ADVIL)  800 MG tablet, Take 1 tablet (800 mg total) by mouth daily as needed., Disp: 90 tablet, Rfl: 0   levothyroxine (SYNTHROID) 50 MCG tablet, Take 1 tablet (50 mcg total) by mouth every morning., Disp: 90 tablet, Rfl: 3   meclizine (ANTIVERT) 25 MG tablet, Take 1 tablet (25 mg total) by mouth 3 (three) times daily as needed for dizziness., Disp: 30 tablet, Rfl: 0     ROS:  Review of Systems  Constitutional:  Negative for fatigue, fever and unexpected weight change.  Respiratory:  Negative for cough, shortness of breath and wheezing.   Cardiovascular:  Negative for chest pain, palpitations and leg swelling.  Gastrointestinal:  Negative for abdominal distention, blood in stool, constipation, diarrhea, nausea and vomiting.  Endocrine: Negative for cold intolerance, heat intolerance and polyuria.  Genitourinary:  Negative for dyspareunia, dysuria, flank pain, frequency, genital sores, hematuria, menstrual problem, pelvic pain, urgency, vaginal bleeding, vaginal discharge and vaginal pain.  Musculoskeletal:  Negative for back pain, joint swelling and myalgias.  Skin:  Negative for rash.  Neurological:  Negative for dizziness, syncope, light-headedness, numbness and headaches.  Hematological:  Negative for adenopathy.  Psychiatric/Behavioral:  Negative for agitation, confusion, sleep disturbance and suicidal ideas. The patient is not nervous/anxious.   BREAST: No symptoms   Objective: There were no vitals taken for this visit.   Physical Exam Constitutional:      Appearance: She is well-developed.  Genitourinary:     Vulva normal.     Right Labia: No rash, tenderness or lesions.    Left Labia: No tenderness, lesions or rash.    No vaginal discharge, erythema or tenderness.      Right Adnexa: not tender and no mass present.    Left Adnexa: not tender and no mass present.    No cervical friability or polyp.     Uterus is not enlarged or tender.  Breasts:    Right: No mass, nipple  discharge, skin change or tenderness.     Left: No mass, nipple discharge, skin change or tenderness.  Neck:     Thyroid: No thyromegaly.  Cardiovascular:     Rate and Rhythm: Normal rate and regular rhythm.     Heart sounds: Normal heart sounds. No murmur heard. Pulmonary:     Effort: Pulmonary effort is normal.     Breath sounds: Normal breath sounds.  Abdominal:     General: There is no distension.     Palpations: Abdomen is soft.     Tenderness: There is no abdominal tenderness. There is no guarding or rebound.  Musculoskeletal:        General: Normal range of motion.     Cervical back: Normal range of motion.  Lymphadenopathy:     Cervical: No cervical adenopathy.  Neurological:     General: No  focal deficit present.     Mental Status: She is alert and oriented to person, place, and time.     Cranial Nerves: No cranial nerve deficit.  Skin:    General: Skin is warm and dry.  Psychiatric:        Mood and Affect: Mood normal.        Behavior: Behavior normal.        Thought Content: Thought content normal.        Judgment: Judgment normal.  Vitals reviewed.     Assessment/Plan:  Encounter for annual routine gynecological examination  Cervical cancer screening - Plan: Cytology - PAP  Screening for HPV (human papillomavirus) - Plan: Cytology - PAP  Cervical high risk human papillomavirus (HPV) DNA test positive - Plan: Cytology - PAP; repeat pap today. Will call with results  Encounter for screening mammogram for malignant neoplasm of breast--pt is current on mammo         GYN counsel breast self exam, mammography screening, menopause, adequate intake of calcium and vitamin D, diet and exercise    F/U  No follow-ups on file.  Ia Leeb B. Arita Severtson, PA-C 06/08/2022 7:44 PM

## 2022-06-09 ENCOUNTER — Other Ambulatory Visit (HOSPITAL_COMMUNITY): Payer: Self-pay

## 2022-06-09 ENCOUNTER — Ambulatory Visit (INDEPENDENT_AMBULATORY_CARE_PROVIDER_SITE_OTHER): Payer: 59 | Admitting: Obstetrics and Gynecology

## 2022-06-09 ENCOUNTER — Encounter: Payer: Self-pay | Admitting: Obstetrics and Gynecology

## 2022-06-09 ENCOUNTER — Other Ambulatory Visit (HOSPITAL_COMMUNITY)
Admission: RE | Admit: 2022-06-09 | Discharge: 2022-06-09 | Disposition: A | Discharge status: Home / Self Care | Payer: 59 | Source: Ambulatory Visit | Attending: Obstetrics and Gynecology | Admitting: Obstetrics and Gynecology

## 2022-06-09 ENCOUNTER — Other Ambulatory Visit: Payer: Self-pay

## 2022-06-09 VITALS — BP 110/80 | Ht 63.0 in | Wt 156.0 lb

## 2022-06-09 DIAGNOSIS — A6004 Herpesviral vulvovaginitis: Secondary | ICD-10-CM

## 2022-06-09 DIAGNOSIS — Z124 Encounter for screening for malignant neoplasm of cervix: Secondary | ICD-10-CM | POA: Diagnosis not present

## 2022-06-09 DIAGNOSIS — Z1231 Encounter for screening mammogram for malignant neoplasm of breast: Secondary | ICD-10-CM

## 2022-06-09 DIAGNOSIS — Z01419 Encounter for gynecological examination (general) (routine) without abnormal findings: Secondary | ICD-10-CM

## 2022-06-09 MED ORDER — VALACYCLOVIR HCL 500 MG PO TABS
500.0000 mg | ORAL_TABLET | Freq: Two times a day (BID) | ORAL | 0 refills | Status: DC
  Filled 2022-06-09: qty 30, 15d supply, fill #0

## 2022-06-09 NOTE — Patient Instructions (Signed)
I value your feedback and you entrusting us with your care. If you get a [facility] patient survey, I would appreciate you taking the time to let us know about your experience today. Thank you!  [facility]: [phone]      

## 2022-06-11 LAB — CYTOLOGY - PAP: Diagnosis: NEGATIVE

## 2022-06-12 ENCOUNTER — Other Ambulatory Visit: Payer: Self-pay | Admitting: Nurse Practitioner

## 2022-06-12 DIAGNOSIS — E785 Hyperlipidemia, unspecified: Secondary | ICD-10-CM

## 2022-06-13 ENCOUNTER — Other Ambulatory Visit (HOSPITAL_COMMUNITY): Payer: Self-pay

## 2022-06-13 MED ORDER — ATORVASTATIN CALCIUM 40 MG PO TABS
40.0000 mg | ORAL_TABLET | Freq: Every day | ORAL | 1 refills | Status: DC
  Filled 2022-06-13: qty 30, 30d supply, fill #0
  Filled 2022-07-20: qty 30, 30d supply, fill #1

## 2022-06-26 ENCOUNTER — Encounter: Payer: Self-pay | Admitting: Nurse Practitioner

## 2022-06-26 ENCOUNTER — Encounter: Payer: 59 | Admitting: Nurse Practitioner

## 2022-06-26 ENCOUNTER — Ambulatory Visit (INDEPENDENT_AMBULATORY_CARE_PROVIDER_SITE_OTHER): Payer: 59 | Admitting: Nurse Practitioner

## 2022-06-26 VITALS — BP 126/76 | HR 80 | Temp 97.5°F | Ht 63.0 in | Wt 157.6 lb

## 2022-06-26 DIAGNOSIS — E039 Hypothyroidism, unspecified: Secondary | ICD-10-CM | POA: Diagnosis not present

## 2022-06-26 DIAGNOSIS — F419 Anxiety disorder, unspecified: Secondary | ICD-10-CM

## 2022-06-26 DIAGNOSIS — K219 Gastro-esophageal reflux disease without esophagitis: Secondary | ICD-10-CM | POA: Diagnosis not present

## 2022-06-26 DIAGNOSIS — E785 Hyperlipidemia, unspecified: Secondary | ICD-10-CM

## 2022-06-26 DIAGNOSIS — R748 Abnormal levels of other serum enzymes: Secondary | ICD-10-CM

## 2022-06-26 DIAGNOSIS — E559 Vitamin D deficiency, unspecified: Secondary | ICD-10-CM

## 2022-06-26 NOTE — Patient Instructions (Addendum)
Apply Voltaren gel and wrap the ankle to reduce the swelling and keep the feet elevated. Routine care. Call in the swelling is not getting better. CPE in 4 month

## 2022-06-26 NOTE — Progress Notes (Signed)
Established Patient Office Visit  Subjective:  Patient ID: Kristi Carter, female    DOB: 04/17/1960  Age: 62 y.o. MRN: [id number]  CC:  Chief Complaint  Patient presents with   Establish Care    HPI  Kristi Carter presents for transfer of care. She has h/o hypothyroidism, GERD, anxiety, vitamin D deficiency and hyperlipidemia. She has left ankle swelling off and on from 1 month. Denies chest pain, SOB, change in appetite, palpitations.   Did yard work and got insect bites last week were itching before but not a present.   HPI   Past Medical History:  Diagnosis Date   A-fib [facility])    Allergy    Antibiotic-induced yeast infection 07/20/2019   Anxiety    GERD (gastroesophageal reflux disease)    Hypothyroid     Past Surgical History:  Procedure Laterality Date   AUGMENTATION MAMMAPLASTY Bilateral 1990   saline   BRAIN SURGERY     CARDIAC ELECTROPHYSIOLOGY MAPPING AND ABLATION     COLONOSCOPY WITH PROPOFOL N/A 04/13/2020   Procedure: COLONOSCOPY WITH PROPOFOL;  Surgeon: Wyline Mood, MD;  Location: [facility] ENDOSCOPY;  Service: Gastroenterology;  Laterality: N/A;   ESOPHAGOGASTRODUODENOSCOPY (EGD) WITH PROPOFOL N/A 04/13/2020   Procedure: ESOPHAGOGASTRODUODENOSCOPY (EGD) WITH PROPOFOL;  Surgeon: Wyline Mood, MD;  Location: [facility] ENDOSCOPY;  Service: Gastroenterology;  Laterality: N/A;   ESOPHAGOGASTRODUODENOSCOPY (EGD) WITH PROPOFOL N/A 07/16/2020   Procedure: ESOPHAGOGASTRODUODENOSCOPY (EGD) WITH PROPOFOL;  Surgeon: Wyline Mood, MD;  Location: [facility] ENDOSCOPY;  Service: Gastroenterology;  Laterality: N/A;    Family History  Problem Relation Age of Onset   Healthy Father    Cancer Paternal Uncle        started with prostate and spread to entire body   Stroke Paternal Grandmother    Heart attack Paternal Grandfather     Social History   Socioeconomic History   Marital status: Married    Spouse name: Not on file   Number of children: Not on file   Years of education: Not  on file   Highest education level: Not on file  Occupational History   Not on file  Tobacco Use   Smoking status: Never   Smokeless tobacco: Never  Vaping Use   Vaping Use: Never used  Substance and Sexual Activity   Alcohol use: Yes    Alcohol/week: 10.0 standard drinks of alcohol    Types: 10 Glasses of wine per week   Drug use: Never   Sexual activity: Not Currently    Birth control/protection: Post-menopausal  Other Topics Concern   Not on file  Social History Narrative   Not on file   Social Determinants of Health   Financial Resource Strain: Not on file  Food Insecurity: Not on file  Transportation Needs: Not on file  Physical Activity: Not on file  Stress: Not on file  Social Connections: Not on file  Intimate Partner Violence: Not on file     Outpatient Medications Prior to Visit  Medication Sig Dispense Refill   atorvastatin (LIPITOR) 40 MG tablet Take 1 tablet (40 mg total) by mouth daily. 30 tablet 1   cholecalciferol (VITAMIN D3) 25 MCG (1000 UNIT) tablet Take 1,000 Units by mouth daily. Patient takes 2 tablets daily     esomeprazole (NEXIUM) 40 MG capsule TAKE 1 CAPSULE BY MOUTH ONCE A DAY 90 capsule 3   ibuprofen (ADVIL) 800 MG tablet Take 1 tablet (800 mg total) by mouth daily as needed. 90 tablet 0   levothyroxine (  SYNTHROID) 50 MCG tablet Take 1 tablet (50 mcg total) by mouth every morning. 90 tablet 3   clonazePAM (KLONOPIN) 0.5 MG tablet Take 1 tablet by mouth 2 times daily as needed (will cause drowsiness) 60 tablet 0   No facility-administered medications prior to visit.    Allergies  Allergen Reactions   Other     Cats, Mold and Feathers   Prednisone     Other reaction(s): Vomiting   Tape     ROS Review of Systems  Constitutional: Negative.   HENT: Negative.    Eyes: Negative.   Respiratory:  Negative for chest tightness and shortness of breath.   Cardiovascular:  Positive for leg swelling.  Genitourinary: Negative.   Skin:  Positive  for rash.  Neurological: Negative.   Psychiatric/Behavioral: Negative.        Objective:    Physical Exam Constitutional:      Appearance: Normal appearance.  HENT:     Head: Normocephalic.     Right Ear: Tympanic membrane normal.     Left Ear: Tympanic membrane normal.     Nose: Nose normal.     Mouth/Throat:     Mouth: Mucous membranes are moist.     Pharynx: Oropharynx is clear.  Eyes:     Extraocular Movements: Extraocular movements intact.     Conjunctiva/sclera: Conjunctivae normal.     Pupils: Pupils are equal, round, and reactive to light.  Cardiovascular:     Rate and Rhythm: Normal rate and regular rhythm.     Pulses: Normal pulses.     Heart sounds: Normal heart sounds.  Pulmonary:     Effort: Pulmonary effort is normal. No respiratory distress.     Breath sounds: Normal breath sounds. No rhonchi.  Abdominal:     General: Bowel sounds are normal.     Palpations: Abdomen is soft. There is no mass.     Tenderness: There is no abdominal tenderness.     Hernia: No hernia is present.  Musculoskeletal:        General: Swelling (left ankle swelling) present. Normal range of motion.     Cervical back: Neck supple. No tenderness.  Skin:    Capillary Refill: Capillary refill takes less than 2 seconds.     Findings: Erythema and rash present.  Neurological:     General: No focal deficit present.     Mental Status: She is alert and oriented to person, place, and time. Mental status is at baseline.  Psychiatric:        Mood and Affect: Mood normal.        Behavior: Behavior normal.        Thought Content: Thought content normal.        Judgment: Judgment normal.     BP 126/76   Pulse 80   Temp (!) 97.5 F (36.4 C)   Ht 5\' 3"  (1.6 m)   Wt 157 lb 9.6 oz (71.5 kg)   SpO2 98%   BMI 27.92 kg/m  Wt Readings from Last 3 Encounters:  06/26/22 157 lb 9.6 oz (71.5 kg)  06/09/22 156 lb (70.8 kg)  04/29/22 159 lb (72.1 kg)     Health Maintenance  Topic Date Due    HIV Screening  Never done   Hepatitis C Screening  Never done   COVID-19 Vaccine (4 - 2023-24 season) 07/12/2022 (Originally 11/08/2021)   INFLUENZA VACCINE  10/09/2022   MAMMOGRAM  01/25/2024   DTaP/Tdap/Td (2 - Td or Tdap) 09/21/2024  PAP SMEAR-Modifier  06/09/2027   COLONOSCOPY (Pts 45-29yrs Insurance coverage will need to be confirmed)  04/13/2030   HPV VACCINES  Aged Out   Zoster Vaccines- Shingrix  Discontinued    There are no preventive care reminders to display for this patient.  Lab Results  Component Value Date   TSH 0.66 02/04/2022   Lab Results  Component Value Date   WBC 7.6 11/13/2021   HGB 14.3 11/13/2021   HCT 41.9 11/13/2021   MCV 95 11/13/2021   PLT 316 11/13/2021   Lab Results  Component Value Date   NA 136 12/17/2020   K 3.8 12/17/2020   CO2 27 12/17/2020   GLUCOSE 80 12/17/2020   BUN 8 12/17/2020   CREATININE 0.46 12/17/2020   BILITOT 1.1 12/17/2020   ALKPHOS 100 12/17/2020   AST 67 (H) 12/17/2020   ALT 29 12/17/2020   PROT 6.8 12/17/2020   ALBUMIN 4.0 12/17/2020   CALCIUM 9.0 12/17/2020   ANIONGAP 14 08/21/2020   GFR 105.36 12/17/2020   Lab Results  Component Value Date   CHOL 185 11/13/2021   Lab Results  Component Value Date   HDL 73 11/13/2021   Lab Results  Component Value Date   LDLCALC 85 11/13/2021   Lab Results  Component Value Date   TRIG 161 (H) 11/13/2021   Lab Results  Component Value Date   CHOLHDL 2.5 11/13/2021   Lab Results  Component Value Date   HGBA1C 5.2 12/17/2020      Assessment & Plan:  GERD without esophagitis Assessment & Plan: Stable on medication. Continue Nexium 40 mg daily.    Hypothyroidism, unspecified type Assessment & Plan: Stable at present. Continue Synthyroid 50 mcg daily   Hyperlipidemia, unspecified hyperlipidemia type Assessment & Plan: Encouraged to follow low-salt heart healthy diet. Continue atorvastatin 40 mg daily for cardiovascular risk reduction.   Anxiety  disorder, unspecified type Assessment & Plan: Stable she states that some days she do not take Klonopin at all. Continue as needed Klonopin and refrain use of alcohol.     Follow-up: Return in about 4 months (around 10/26/2022) for physical.   Kara Dies, NP

## 2022-06-30 ENCOUNTER — Other Ambulatory Visit: Payer: Self-pay | Admitting: Nurse Practitioner

## 2022-06-30 MED ORDER — CLONAZEPAM 0.5 MG PO TABS
0.5000 mg | ORAL_TABLET | Freq: Two times a day (BID) | ORAL | 0 refills | Status: DC | PRN
  Filled 2022-06-30: qty 60, 30d supply, fill #0

## 2022-07-01 ENCOUNTER — Other Ambulatory Visit: Payer: Self-pay

## 2022-07-01 ENCOUNTER — Other Ambulatory Visit (HOSPITAL_COMMUNITY): Payer: Self-pay

## 2022-07-06 NOTE — Assessment & Plan Note (Signed)
Stable she states that some days she do not take Klonopin at all. Continue as needed Klonopin and refrain use of alcohol.

## 2022-07-06 NOTE — Assessment & Plan Note (Signed)
Stable at present. Continue Synthyroid 50 mcg daily

## 2022-07-06 NOTE — Assessment & Plan Note (Signed)
Encouraged to follow low-salt heart healthy diet. Continue atorvastatin 40 mg daily for cardiovascular risk reduction.

## 2022-07-06 NOTE — Assessment & Plan Note (Signed)
Stable on medication. Continue Nexium 40 mg daily.

## 2022-07-11 ENCOUNTER — Other Ambulatory Visit: Payer: Self-pay | Admitting: Nurse Practitioner

## 2022-07-15 NOTE — Telephone Encounter (Signed)
Last OV 06/26/22 vit d 1000 units daily filled historically are you willing to fill? No Vit d Level.

## 2022-07-17 ENCOUNTER — Other Ambulatory Visit (HOSPITAL_COMMUNITY): Payer: Self-pay

## 2022-07-17 DIAGNOSIS — B079 Viral wart, unspecified: Secondary | ICD-10-CM | POA: Diagnosis not present

## 2022-07-17 DIAGNOSIS — D485 Neoplasm of uncertain behavior of skin: Secondary | ICD-10-CM | POA: Diagnosis not present

## 2022-07-17 MED ORDER — VITAMIN D 25 MCG (1000 UNIT) PO TABS
1000.0000 [IU] | ORAL_TABLET | Freq: Every day | ORAL | 0 refills | Status: DC
  Filled 2022-07-17 – 2022-12-13 (×3): qty 90, 90d supply, fill #0

## 2022-07-20 ENCOUNTER — Encounter: Payer: Self-pay | Admitting: Nurse Practitioner

## 2022-07-21 ENCOUNTER — Other Ambulatory Visit: Payer: Self-pay

## 2022-07-21 ENCOUNTER — Other Ambulatory Visit (HOSPITAL_COMMUNITY): Payer: Self-pay

## 2022-07-21 DIAGNOSIS — E785 Hyperlipidemia, unspecified: Secondary | ICD-10-CM

## 2022-07-21 MED ORDER — ESOMEPRAZOLE MAGNESIUM 40 MG PO CPDR
40.0000 mg | DELAYED_RELEASE_CAPSULE | Freq: Every day | ORAL | 3 refills | Status: DC
  Filled 2022-07-21: qty 90, 90d supply, fill #0
  Filled 2022-08-30 – 2022-10-14 (×2): qty 90, 90d supply, fill #1
  Filled 2023-01-12: qty 90, 90d supply, fill #2
  Filled 2023-04-12: qty 90, 90d supply, fill #3

## 2022-07-21 MED ORDER — ATORVASTATIN CALCIUM 40 MG PO TABS
40.0000 mg | ORAL_TABLET | Freq: Every day | ORAL | 3 refills | Status: DC
Start: 2022-07-21 — End: 2023-07-06
  Filled 2022-07-22: qty 90, 90d supply, fill #0
  Filled 2022-08-30 – 2022-10-10 (×2): qty 90, 90d supply, fill #1
  Filled 2023-01-12: qty 90, 90d supply, fill #2
  Filled 2023-04-14: qty 90, 90d supply, fill #3

## 2022-07-22 ENCOUNTER — Other Ambulatory Visit (HOSPITAL_COMMUNITY): Payer: Self-pay

## 2022-07-30 ENCOUNTER — Other Ambulatory Visit: Payer: Self-pay | Admitting: Nurse Practitioner

## 2022-07-31 ENCOUNTER — Other Ambulatory Visit: Payer: Self-pay | Admitting: Nurse Practitioner

## 2022-07-31 MED ORDER — CLONAZEPAM 0.5 MG PO TABS: 0.5000 mg | ORAL_TABLET | Freq: Two times a day (BID) | ORAL | 0 refills | Status: DC | PRN

## 2022-07-31 MED ORDER — CLONAZEPAM 0.5 MG PO TABS
0.5000 mg | ORAL_TABLET | Freq: Two times a day (BID) | ORAL | 0 refills | Status: DC | PRN
  Filled 2022-07-31: qty 60, 30d supply, fill #0

## 2022-07-31 NOTE — Telephone Encounter (Signed)
LOV: 06/26/2022   NOV: 10/28/2022

## 2022-08-01 ENCOUNTER — Other Ambulatory Visit (HOSPITAL_COMMUNITY): Payer: Self-pay

## 2022-08-01 ENCOUNTER — Other Ambulatory Visit: Payer: Self-pay

## 2022-08-08 ENCOUNTER — Ambulatory Visit: Payer: Self-pay | Admitting: Internal Medicine

## 2022-08-30 ENCOUNTER — Other Ambulatory Visit: Payer: Self-pay | Admitting: Nurse Practitioner

## 2022-08-31 ENCOUNTER — Other Ambulatory Visit: Payer: Self-pay

## 2022-09-01 ENCOUNTER — Other Ambulatory Visit: Payer: Self-pay

## 2022-09-02 ENCOUNTER — Other Ambulatory Visit: Payer: Self-pay

## 2022-09-02 MED ORDER — CLONAZEPAM 0.5 MG PO TABS: 0.5000 mg | ORAL_TABLET | Freq: Two times a day (BID) | ORAL | 0 refills | Status: DC | PRN

## 2022-09-12 ENCOUNTER — Other Ambulatory Visit (HOSPITAL_COMMUNITY): Payer: Self-pay

## 2022-09-12 ENCOUNTER — Other Ambulatory Visit: Payer: Self-pay | Admitting: Nurse Practitioner

## 2022-09-16 ENCOUNTER — Other Ambulatory Visit: Payer: Self-pay | Admitting: Obstetrics and Gynecology

## 2022-09-16 ENCOUNTER — Other Ambulatory Visit: Payer: Self-pay

## 2022-09-16 ENCOUNTER — Other Ambulatory Visit: Payer: Self-pay | Admitting: Nurse Practitioner

## 2022-09-16 ENCOUNTER — Encounter: Payer: Self-pay | Admitting: Nurse Practitioner

## 2022-09-16 ENCOUNTER — Other Ambulatory Visit (HOSPITAL_COMMUNITY): Payer: Self-pay

## 2022-09-16 DIAGNOSIS — A6004 Herpesviral vulvovaginitis: Secondary | ICD-10-CM

## 2022-09-17 ENCOUNTER — Other Ambulatory Visit (HOSPITAL_COMMUNITY): Payer: Self-pay

## 2022-09-17 MED ORDER — VALACYCLOVIR HCL 500 MG PO TABS
500.0000 mg | ORAL_TABLET | Freq: Two times a day (BID) | ORAL | 0 refills | Status: DC
Start: 2022-09-17 — End: 2022-12-04
  Filled 2022-09-17: qty 30, 15d supply, fill #0

## 2022-09-18 ENCOUNTER — Other Ambulatory Visit: Payer: Self-pay

## 2022-09-18 ENCOUNTER — Other Ambulatory Visit (HOSPITAL_COMMUNITY): Payer: Self-pay

## 2022-09-18 ENCOUNTER — Other Ambulatory Visit: Payer: Self-pay | Admitting: Nurse Practitioner

## 2022-09-18 MED ORDER — CLONAZEPAM 0.5 MG PO TABS
0.5000 mg | ORAL_TABLET | Freq: Two times a day (BID) | ORAL | 0 refills | Status: DC | PRN
  Filled 2022-09-18: qty 60, 30d supply, fill #0

## 2022-09-18 NOTE — Telephone Encounter (Signed)
Spoke to [facility] [employer] and it shows rx was d/c on 09/02/22, just needs to be resent to Pharmacy!

## 2022-09-18 NOTE — Telephone Encounter (Signed)
Please call the pharmacy and check with them.

## 2022-09-19 ENCOUNTER — Other Ambulatory Visit: Payer: Self-pay

## 2022-09-20 ENCOUNTER — Other Ambulatory Visit (HOSPITAL_COMMUNITY): Payer: Self-pay

## 2022-10-08 ENCOUNTER — Encounter (INDEPENDENT_AMBULATORY_CARE_PROVIDER_SITE_OTHER): Payer: Self-pay

## 2022-10-10 ENCOUNTER — Other Ambulatory Visit (HOSPITAL_COMMUNITY): Payer: Self-pay

## 2022-10-10 ENCOUNTER — Encounter: Payer: Self-pay | Admitting: Nurse Practitioner

## 2022-10-14 ENCOUNTER — Other Ambulatory Visit: Payer: Self-pay

## 2022-10-14 ENCOUNTER — Other Ambulatory Visit (HOSPITAL_COMMUNITY): Payer: Self-pay

## 2022-10-14 ENCOUNTER — Other Ambulatory Visit: Payer: Self-pay | Admitting: Nurse Practitioner

## 2022-10-14 MED ORDER — CLONAZEPAM 0.5 MG PO TABS
0.5000 mg | ORAL_TABLET | Freq: Two times a day (BID) | ORAL | 1 refills | Status: DC | PRN
  Filled 2022-10-14 – 2022-10-28 (×2): qty 60, 30d supply, fill #0
  Filled 2022-11-25: qty 60, 30d supply, fill #1

## 2022-10-14 NOTE — Telephone Encounter (Signed)
LOV: 06/26/2022   NOV: 10/28/2022

## 2022-10-15 ENCOUNTER — Other Ambulatory Visit: Payer: Self-pay

## 2022-10-28 ENCOUNTER — Ambulatory Visit (INDEPENDENT_AMBULATORY_CARE_PROVIDER_SITE_OTHER): Payer: 59 | Admitting: Nurse Practitioner

## 2022-10-28 ENCOUNTER — Encounter: Payer: Self-pay | Admitting: Nurse Practitioner

## 2022-10-28 ENCOUNTER — Telehealth: Payer: Self-pay | Admitting: Gastroenterology

## 2022-10-28 ENCOUNTER — Other Ambulatory Visit: Payer: Self-pay

## 2022-10-28 ENCOUNTER — Other Ambulatory Visit (HOSPITAL_COMMUNITY): Payer: Self-pay

## 2022-10-28 VITALS — BP 128/78 | HR 71 | Temp 97.5°F | Ht 63.0 in | Wt 154.8 lb

## 2022-10-28 DIAGNOSIS — Z0000 Encounter for general adult medical examination without abnormal findings: Secondary | ICD-10-CM

## 2022-10-28 DIAGNOSIS — Z8719 Personal history of other diseases of the digestive system: Secondary | ICD-10-CM

## 2022-10-28 DIAGNOSIS — K317 Polyp of stomach and duodenum: Secondary | ICD-10-CM

## 2022-10-28 DIAGNOSIS — Z0001 Encounter for general adult medical examination with abnormal findings: Secondary | ICD-10-CM

## 2022-10-28 DIAGNOSIS — R300 Dysuria: Secondary | ICD-10-CM

## 2022-10-28 LAB — COMPREHENSIVE METABOLIC PANEL
ALT: 19 U/L (ref 0–35)
AST: 34 U/L (ref 0–37)
Albumin: 3.8 g/dL (ref 3.5–5.2)
Alkaline Phosphatase: 92 U/L (ref 39–117)
BUN: 10 mg/dL (ref 6–23)
CO2: 28 mEq/L (ref 19–32)
Calcium: 8.5 mg/dL (ref 8.4–10.5)
Chloride: 102 mEq/L (ref 96–112)
Creatinine, Ser: 0.54 mg/dL (ref 0.40–1.20)
GFR: 100.05 mL/min (ref >60.00)
Glucose, Bld: 82 mg/dL (ref 70–99)
Potassium: 3.7 mEq/L (ref 3.5–5.1)
Sodium: 138 mEq/L (ref 135–145)
Total Bilirubin: 0.6 mg/dL (ref 0.2–1.2)
Total Protein: 6.7 g/dL (ref 6.0–8.3)

## 2022-10-28 LAB — POC URINALSYSI DIPSTICK (AUTOMATED)
Glucose, UA: NEGATIVE
Nitrite, UA: POSITIVE
Protein, UA: POSITIVE — AB
Spec Grav, UA: >=1.03 — AB (ref 1.010–1.025)
Urobilinogen, UA: 1 E.U./dL
pH, UA: 5.5 (ref 5.0–8.0)

## 2022-10-28 LAB — CBC WITH DIFFERENTIAL/PLATELET
Basophils Absolute: 0 10*3/uL (ref 0.0–0.1)
Basophils Relative: 0.8 % (ref 0.0–3.0)
Eosinophils Absolute: 0.2 10*3/uL (ref 0.0–0.7)
Eosinophils Relative: 3 % (ref 0.0–5.0)
HCT: 40.4 % (ref 36.0–46.0)
Hemoglobin: 13.2 g/dL (ref 12.0–15.0)
Lymphocytes Relative: 32.5 % (ref 12.0–46.0)
Lymphs Abs: 1.9 10*3/uL (ref 0.7–4.0)
MCHC: 32.8 g/dL (ref 30.0–36.0)
MCV: 101.8 fl — ABNORMAL HIGH (ref 78.0–100.0)
Monocytes Absolute: 0.5 10*3/uL (ref 0.1–1.0)
Monocytes Relative: 9.1 % (ref 3.0–12.0)
Neutro Abs: 3.1 10*3/uL (ref 1.4–7.7)
Neutrophils Relative %: 54.6 % (ref 43.0–77.0)
Platelets: 326 10*3/uL (ref 150.0–400.0)
RBC: 3.97 Mil/uL (ref 3.87–5.11)
RDW: 14.3 % (ref 11.5–15.5)
WBC: 5.7 10*3/uL (ref 4.0–10.5)

## 2022-10-28 LAB — LIPID PANEL
Cholesterol: 196 mg/dL (ref 0–200)
HDL: 63.3 mg/dL (ref >39.00)
NonHDL: 132.74
Total CHOL/HDL Ratio: 3
Triglycerides: 231 mg/dL — ABNORMAL HIGH (ref 0.0–149.0)
VLDL: 46.2 mg/dL — ABNORMAL HIGH (ref 0.0–40.0)

## 2022-10-28 LAB — URINALYSIS, ROUTINE W REFLEX MICROSCOPIC
Bilirubin Urine: NEGATIVE
Ketones, ur: NEGATIVE
Nitrite: POSITIVE — AB
Specific Gravity, Urine: >=1.03 — AB (ref 1.000–1.030)
Total Protein, Urine: 30 — AB
Urine Glucose: NEGATIVE
Urobilinogen, UA: 1 (ref 0.0–1.0)
pH: 6 (ref 5.0–8.0)

## 2022-10-28 LAB — TSH: TSH: 0.77 u[IU]/mL (ref 0.35–5.50)

## 2022-10-28 LAB — LDL CHOLESTEROL, DIRECT: Direct LDL: 105 mg/dL

## 2022-10-28 MED ORDER — FLUCONAZOLE 150 MG PO TABS
150.0000 mg | ORAL_TABLET | Freq: Once | ORAL | 1 refills | Status: AC
Start: 2022-10-28 — End: 2022-10-29
  Filled 2022-10-28: qty 1, 1d supply, fill #0

## 2022-10-28 MED ORDER — CEPHALEXIN 500 MG PO CAPS
500.0000 mg | ORAL_CAPSULE | Freq: Two times a day (BID) | ORAL | 0 refills | Status: AC
  Filled 2022-10-28: qty 14, 7d supply, fill #0

## 2022-10-28 MED ORDER — FLUCONAZOLE 150 MG PO TABS
150.0000 mg | ORAL_TABLET | Freq: Once | ORAL | 1 refills | Status: DC
  Filled 2022-10-28: qty 1, 1d supply, fill #0

## 2022-10-28 MED ORDER — CEPHALEXIN 500 MG PO CAPS
500.0000 mg | ORAL_CAPSULE | Freq: Two times a day (BID) | ORAL | 0 refills | Status: DC
  Filled 2022-10-28: qty 14, 7d supply, fill #0

## 2022-10-28 NOTE — Telephone Encounter (Signed)
Patient called in stating that she got a call stating that she needs an EGD.

## 2022-10-28 NOTE — Assessment & Plan Note (Signed)
Update with vaccine and Pap Encouraged patient to consume a balanced diet and regular exercise regimen. Advised to see an eye doctor and dentist annually.

## 2022-10-28 NOTE — Progress Notes (Signed)
Established Patient Office Visit  Subjective:  Patient ID: Kristi Carter, female    DOB: 1960/08/24  Age: 62 y.o. MRN: [id number]  CC:  Chief Complaint  Patient presents with   Annual Exam    HPI  Kristi Carter presents to the clinic for her annual physical exam.  Flu: Due Tetanus: 2016 COVID: Pfizer X2021 Pap smear: April, 2024 Mammogram: Nov, 2023 Dentist: every 6 months Eye examination: Due  Exercise: Walking 3-4 times a week and working in the yard.   Diet: Patient does eat meat. Patient consumes fruits and veggies. Patient eat some  fried food. Patient drinks water, ... Coffee.  She also has been experiencing dysuria from couple of days.  Some frequency and urgency.  No flank pain.  Denies any hematuria or abdominal pain   Past Medical History:  Diagnosis Date   A-fib [facility])    Allergy    Antibiotic-induced yeast infection 07/20/2019   Anxiety    GERD (gastroesophageal reflux disease)    Hypothyroid     Past Surgical History:  Procedure Laterality Date   AUGMENTATION MAMMAPLASTY Bilateral 1990   saline   BRAIN SURGERY     CARDIAC ELECTROPHYSIOLOGY MAPPING AND ABLATION     COLONOSCOPY WITH PROPOFOL N/A 04/13/2020   Procedure: COLONOSCOPY WITH PROPOFOL;  Surgeon: Wyline Mood, MD;  Location: [facility] ENDOSCOPY;  Service: Gastroenterology;  Laterality: N/A;   ESOPHAGOGASTRODUODENOSCOPY (EGD) WITH PROPOFOL N/A 04/13/2020   Procedure: ESOPHAGOGASTRODUODENOSCOPY (EGD) WITH PROPOFOL;  Surgeon: Wyline Mood, MD;  Location: [facility] ENDOSCOPY;  Service: Gastroenterology;  Laterality: N/A;   ESOPHAGOGASTRODUODENOSCOPY (EGD) WITH PROPOFOL N/A 07/16/2020   Procedure: ESOPHAGOGASTRODUODENOSCOPY (EGD) WITH PROPOFOL;  Surgeon: Wyline Mood, MD;  Location: [facility] ENDOSCOPY;  Service: Gastroenterology;  Laterality: N/A;    Family History  Problem Relation Age of Onset   Healthy Father    Cancer Paternal Uncle        started with prostate and spread to entire body   Stroke Paternal  Grandmother    Heart attack Paternal Grandfather     Social History   Socioeconomic History   Marital status: Married    Spouse name: Not on file   Number of children: Not on file   Years of education: Not on file   Highest education level: Not on file  Occupational History   Not on file  Tobacco Use   Smoking status: Never   Smokeless tobacco: Never  Vaping Use   Vaping status: Never Used  Substance and Sexual Activity   Alcohol use: Yes    Alcohol/week: 10.0 standard drinks of alcohol    Types: 10 Glasses of wine per week   Drug use: Never   Sexual activity: Not Currently    Birth control/protection: Post-menopausal  Other Topics Concern   Not on file  Social History Narrative   Not on file   Social Determinants of Health   Financial Resource Strain: Not on file  Food Insecurity: Not on file  Transportation Needs: Not on file  Physical Activity: Not on file  Stress: Not on file  Social Connections: Not on file  Intimate Partner Violence: Not on file     Outpatient Medications Prior to Visit  Medication Sig Dispense Refill   atorvastatin (LIPITOR) 40 MG tablet Take 1 tablet (40 mg total) by mouth daily. 90 tablet 3   cholecalciferol (VITAMIN D3) 25 MCG (1000 UNIT) tablet Take 1 tablet (1,000 Units total) by mouth daily. 90 tablet 0   clonazePAM (KLONOPIN) 0.5 MG  tablet Take 1 tablet (0.5 mg total) by mouth 2 (two) times daily as needed. (will cause drowsiness) 60 tablet 0   clonazePAM (KLONOPIN) 0.5 MG tablet Take 1 tablet (0.5 mg total) by mouth 2 (two) times daily as needed for anxiety. 60 tablet 1   esomeprazole (NEXIUM) 40 MG capsule Take 1 capsule (40 mg total) by mouth daily. 90 capsule 3   ibuprofen (ADVIL) 800 MG tablet Take 1 tablet (800 mg total) by mouth daily as needed. 90 tablet 0   levothyroxine (SYNTHROID) 50 MCG tablet Take 1 tablet (50 mcg total) by mouth every morning. 90 tablet 3   No facility-administered medications prior to visit.     Allergies  Allergen Reactions   Other     Cats, Mold and Feathers   Prednisone     Other reaction(s): Vomiting   Tape     ROS Review of Systems Negative unless indicated in HPI.    Objective:    Physical Exam Constitutional:      Appearance: Normal appearance. She is normal weight.  HENT:     Head: Normocephalic.     Right Ear: Tympanic membrane normal.     Left Ear: Tympanic membrane normal.     Mouth/Throat:     Mouth: Mucous membranes are moist.  Eyes:     Extraocular Movements: Extraocular movements intact.     Conjunctiva/sclera: Conjunctivae normal.     Pupils: Pupils are equal, round, and reactive to light.  Neck:     Thyroid: No thyroid mass or thyroid tenderness.  Cardiovascular:     Rate and Rhythm: Normal rate and regular rhythm.     Pulses: Normal pulses.     Heart sounds: Normal heart sounds. No murmur heard. Pulmonary:     Effort: Pulmonary effort is normal.     Breath sounds: Normal breath sounds.  Abdominal:     General: Bowel sounds are normal.     Palpations: Abdomen is soft. There is no mass.     Tenderness: There is no abdominal tenderness. There is no rebound.  Musculoskeletal:        General: No swelling.     Cervical back: Neck supple. No tenderness.     Right lower leg: No edema.     Left lower leg: No edema.  Skin:    Findings: No bruising, erythema or rash.  Neurological:     General: No focal deficit present.     Mental Status: She is alert and oriented to person, place, and time. Mental status is at baseline.  Psychiatric:        Mood and Affect: Mood normal.        Behavior: Behavior normal.        Thought Content: Thought content normal.        Judgment: Judgment normal.     BP 128/78   Pulse 71   Temp (!) 97.5 F (36.4 C)   Ht 5\' 3"  (1.6 m)   Wt 154 lb 12.8 oz (70.2 kg)   SpO2 99%   BMI 27.42 kg/m  Wt Readings from Last 3 Encounters:  10/28/22 154 lb 12.8 oz (70.2 kg)  06/26/22 157 lb 9.6 oz (71.5 kg)   06/09/22 156 lb (70.8 kg)     Health Maintenance  Topic Date Due   HIV Screening  Never done   Hepatitis C Screening  Never done   COVID-19 Vaccine (4 - 2023-24 season) 11/13/2022 (Originally 11/08/2021)   INFLUENZA VACCINE  06/08/2023 (Originally  10/09/2022)   MAMMOGRAM  01/25/2024   DTaP/Tdap/Td (2 - Td or Tdap) 09/21/2024   PAP SMEAR-Modifier  06/09/2027   Colonoscopy  04/13/2030   HPV VACCINES  Aged Out   Zoster Vaccines- Shingrix  Discontinued    There are no preventive care reminders to display for this patient.  Lab Results  Component Value Date   TSH 0.77 10/28/2022   Lab Results  Component Value Date   WBC 5.7 10/28/2022   HGB 13.2 10/28/2022   HCT 40.4 10/28/2022   MCV 101.8 (H) 10/28/2022   PLT 326.0 10/28/2022   Lab Results  Component Value Date   NA 138 10/28/2022   K 3.7 10/28/2022   CO2 28 10/28/2022   GLUCOSE 82 10/28/2022   BUN 10 10/28/2022   CREATININE 0.54 10/28/2022   BILITOT 0.6 10/28/2022   ALKPHOS 92 10/28/2022   AST 34 10/28/2022   ALT 19 10/28/2022   PROT 6.7 10/28/2022   ALBUMIN 3.8 10/28/2022   CALCIUM 8.5 10/28/2022   ANIONGAP 14 08/21/2020   GFR 100.05 10/28/2022   Lab Results  Component Value Date   CHOL 196 10/28/2022   Lab Results  Component Value Date   HDL 63.30 10/28/2022   Lab Results  Component Value Date   LDLCALC 85 11/13/2021   Lab Results  Component Value Date   TRIG 231.0 (H) 10/28/2022   Lab Results  Component Value Date   CHOLHDL 3 10/28/2022   Lab Results  Component Value Date   HGBA1C 5.2 12/17/2020      Assessment & Plan:  Routine physical examination Assessment & Plan: Update with vaccine and Pap Encouraged patient to consume a balanced diet and regular exercise regimen. Advised to see an eye doctor and dentist annually.     Orders: -     CBC with Differential/Platelet -     Comprehensive metabolic panel -     Lipid panel -     TSH  Dysuria Assessment & Plan: POCT urinalysis  positive for leukocytes and nitrite. Culture pending, will treat with cephalexin. Advised to increase fluid intake and take over-the-counter AZO for pain relief.  Orders: -     Urine Culture -     Urinalysis, Routine w reflex microscopic -     POCT Urinalysis Dipstick (Automated) -     Urine Microscopic  Other orders -     Cephalexin; Take 1 capsule (500 mg total) by mouth 2 (two) times daily for 7 days.  Dispense: 14 capsule; Refill: 0 -     Fluconazole; Take 1 tablet (150 mg total) by mouth once for 1 dose.  Dispense: 1 tablet; Refill: 1 -     LDL cholesterol, direct -     Fluconazole; Take 1 tablet (150 mg total) by mouth daily.  Dispense: 2 tablet; Refill: 0    Follow-up: No follow-ups on file.   Kara Dies, NP

## 2022-10-28 NOTE — Telephone Encounter (Signed)
Called patient to schedule her the EGD and she decided to have it done until 12/16/2022 with Dr. Tobi Bastos.

## 2022-10-28 NOTE — Addendum Note (Signed)
Addended by: Adela Ports on: 10/28/2022 04:57 PM   Modules accepted: Orders

## 2022-10-30 ENCOUNTER — Encounter: Payer: Self-pay | Admitting: Nurse Practitioner

## 2022-10-30 ENCOUNTER — Other Ambulatory Visit (HOSPITAL_COMMUNITY): Payer: Self-pay

## 2022-10-30 ENCOUNTER — Other Ambulatory Visit: Payer: Self-pay

## 2022-10-30 ENCOUNTER — Encounter: Payer: Self-pay | Admitting: Pharmacist

## 2022-10-30 LAB — URINE CULTURE
MICRO NUMBER:: 15355785
SPECIMEN QUALITY:: ADEQUATE

## 2022-10-30 MED ORDER — FLUCONAZOLE 150 MG PO TABS
150.0000 mg | ORAL_TABLET | Freq: Every day | ORAL | 0 refills | Status: DC
  Filled 2022-10-30: qty 2, 2d supply, fill #0

## 2022-10-31 ENCOUNTER — Ambulatory Visit: Payer: 59 | Admitting: Nurse Practitioner

## 2022-10-31 NOTE — Telephone Encounter (Signed)
Called and confirmed patient feeling better and canceled appointment.

## 2022-11-05 ENCOUNTER — Encounter: Payer: Self-pay | Admitting: Nurse Practitioner

## 2022-11-05 DIAGNOSIS — R300 Dysuria: Secondary | ICD-10-CM | POA: Insufficient documentation

## 2022-11-05 NOTE — Assessment & Plan Note (Signed)
POCT urinalysis positive for leukocytes and nitrite. Culture pending, will treat with cephalexin. Advised to increase fluid intake and take over-the-counter AZO for pain relief.

## 2022-11-14 ENCOUNTER — Telehealth: Payer: Self-pay | Admitting: Nurse Practitioner

## 2022-11-14 ENCOUNTER — Telehealth: Payer: 59 | Admitting: Physician Assistant

## 2022-11-14 DIAGNOSIS — N390 Urinary tract infection, site not specified: Secondary | ICD-10-CM | POA: Diagnosis not present

## 2022-11-14 MED ORDER — SULFAMETHOXAZOLE-TRIMETHOPRIM 800-160 MG PO TABS
1.0000 | ORAL_TABLET | Freq: Two times a day (BID) | ORAL | 0 refills | Status: DC
Start: 2022-11-14 — End: 2022-12-13

## 2022-11-14 MED ORDER — FLUCONAZOLE 150 MG PO TABS
150.0000 mg | ORAL_TABLET | ORAL | 0 refills | Status: DC | PRN
Start: 2022-11-14 — End: 2022-12-13

## 2022-11-14 NOTE — Telephone Encounter (Signed)
Pt is aware and gave a verbal understanding.  

## 2022-11-14 NOTE — Telephone Encounter (Signed)
She needs to get evaluated the last UTI was on 8/20.

## 2022-11-14 NOTE — Telephone Encounter (Signed)
Patient just called and said she has a UTI and the medication she was prescribed is not helping. She would like to see if she could get something else. She said she is starting to feel pressure again and feels like she has to use the bathroom but nothing comes out. Her number is [phone].

## 2022-11-14 NOTE — Progress Notes (Signed)
E-Visit for Urinary Problems  We are sorry that you are not feeling well.  Here is how we plan to help!  Based on what you shared with me it looks like you most likely have a simple urinary tract infection.  A UTI (Urinary Tract Infection) is a bacterial infection of the bladder.  Most cases of urinary tract infections are simple to treat but a key part of your care is to encourage you to drink plenty of fluids and watch your symptoms carefully.  I have prescribed Bactrim DS One tablet twice a day for 5 days.  Your symptoms should gradually improve. Call us if the burning in your urine worsens, you develop worsening fever, back pain or pelvic pain or if your symptoms do not resolve after completing the antibiotic.  Urinary tract infections can be prevented by drinking plenty of water to keep your body hydrated.  Also be sure when you wipe, wipe from front to back and don't hold it in!  If possible, empty your bladder every 4 hours.  HOME CARE Drink plenty of fluids Compete the full course of the antibiotics even if the symptoms resolve Remember, when you need to go.go. Holding in your urine can increase the likelihood of getting a UTI! GET HELP RIGHT AWAY IF: You cannot urinate You get a high fever Worsening back pain occurs You see blood in your urine You feel sick to your stomach or throw up You feel like you are going to pass out  MAKE SURE YOU  Understand these instructions. Will watch your condition. Will get help right away if you are not doing well or get worse.   Thank you for choosing an e-visit.  Your e-visit answers were reviewed by a board certified advanced clinical practitioner to complete your personal care plan. Depending upon the condition, your plan could have included both over the counter or prescription medications.  Please review your pharmacy choice. Make sure the pharmacy is open so you can pick up prescription now. If there is a problem, you may contact  your provider through MyChart messaging and have the prescription routed to another pharmacy.  Your safety is important to us. If you have drug allergies check your prescription carefully.   For the next 24 hours you can use MyChart to ask questions about today's visit, request a non-urgent call back, or ask for a work or school excuse. You will get an email in the next two days asking about your experience. I hope that your e-visit has been valuable and will speed your recovery.  I have spent 5 minutes in review of e-visit questionnaire, review and updating patient chart, medical decision making and response to patient.   Jennifer M Burnette, PA-C  

## 2022-11-14 NOTE — Addendum Note (Signed)
Addended by: Margaretann Loveless on: 11/14/2022 05:52 PM   Modules accepted: Orders

## 2022-11-25 ENCOUNTER — Other Ambulatory Visit (HOSPITAL_COMMUNITY): Payer: Self-pay

## 2022-11-26 DIAGNOSIS — H8111 Benign paroxysmal vertigo, right ear: Secondary | ICD-10-CM | POA: Diagnosis not present

## 2022-11-27 ENCOUNTER — Encounter: Payer: Self-pay | Admitting: Nurse Practitioner

## 2022-12-04 ENCOUNTER — Other Ambulatory Visit (HOSPITAL_COMMUNITY): Payer: Self-pay

## 2022-12-04 ENCOUNTER — Other Ambulatory Visit: Payer: Self-pay | Admitting: Obstetrics and Gynecology

## 2022-12-04 DIAGNOSIS — A6004 Herpesviral vulvovaginitis: Secondary | ICD-10-CM

## 2022-12-04 MED ORDER — VALACYCLOVIR HCL 500 MG PO TABS
500.0000 mg | ORAL_TABLET | Freq: Two times a day (BID) | ORAL | 0 refills | Status: DC
  Filled 2022-12-04: qty 30, 15d supply, fill #0

## 2022-12-05 ENCOUNTER — Other Ambulatory Visit: Payer: Self-pay

## 2022-12-09 ENCOUNTER — Encounter: Payer: Self-pay | Admitting: Gastroenterology

## 2022-12-11 ENCOUNTER — Telehealth: Payer: Self-pay

## 2022-12-11 NOTE — Telephone Encounter (Signed)
Patient lvm requesting to cancel 12/16/22 EGD with Dr. Tobi Bastos due to her high copay $750.  She plans to call back to reschedule procedure in January when her [employer] account has the funds.  Vikkie in Endo notified of cancellation.  Thanks, [place], [place]

## 2022-12-13 ENCOUNTER — Ambulatory Visit (HOSPITAL_COMMUNITY)
Admission: EM | Admit: 2022-12-13 | Discharge: 2022-12-13 | Disposition: A | Discharge status: Home / Self Care | Payer: 59 | Attending: Family Medicine | Admitting: Family Medicine

## 2022-12-13 ENCOUNTER — Telehealth: Payer: 59 | Admitting: Physician Assistant

## 2022-12-13 ENCOUNTER — Encounter (HOSPITAL_COMMUNITY): Payer: Self-pay

## 2022-12-13 ENCOUNTER — Other Ambulatory Visit (HOSPITAL_COMMUNITY): Payer: Self-pay

## 2022-12-13 DIAGNOSIS — N390 Urinary tract infection, site not specified: Secondary | ICD-10-CM

## 2022-12-13 DIAGNOSIS — N3090 Cystitis, unspecified without hematuria: Secondary | ICD-10-CM | POA: Insufficient documentation

## 2022-12-13 LAB — POCT URINALYSIS DIP (MANUAL ENTRY)
Bilirubin, UA: NEGATIVE
Glucose, UA: NEGATIVE mg/dL
Ketones, POC UA: NEGATIVE mg/dL
Nitrite, UA: NEGATIVE
Protein Ur, POC: NEGATIVE mg/dL
Spec Grav, UA: 1.01 (ref 1.010–1.025)
Urobilinogen, UA: 0.2 U/dL
pH, UA: 6 (ref 5.0–8.0)

## 2022-12-13 MED ORDER — CEPHALEXIN 500 MG PO CAPS: 500.0000 mg | ORAL_CAPSULE | Freq: Two times a day (BID) | ORAL | 0 refills | Status: DC

## 2022-12-13 MED ORDER — FLUCONAZOLE 150 MG PO TABS: ORAL_TABLET | ORAL | 0 refills | Status: DC

## 2022-12-13 NOTE — ED Triage Notes (Signed)
Pt states that she has some abdominal pressure and urinary retention. X2 days  Pt states that she has had a UTI recently before today.

## 2022-12-13 NOTE — ED Triage Notes (Signed)
Patient called x1 for triage. No answer 

## 2022-12-13 NOTE — Discharge Instructions (Signed)
You have had labs (urine culture) sent today. We will call you with any significant abnormalities or if there is need to begin or change treatment or pursue further follow up.  You may also review your test results online through MyChart. If you do not have a MyChart account, instructions to sign up should be on your discharge paperwork.  

## 2022-12-13 NOTE — Progress Notes (Signed)
Because of multiple UTI in the past month or so, and concern for antibiotic resistance and need for urine culture, I feel your condition warrants further evaluation and I recommend that you be seen in a face to face visit.   NOTE: There will be NO CHARGE for this eVisit   If you are having a true medical emergency please call 911.      For an urgent face to face visit, [facility] has eight urgent care centers for your convenience:   NEW!! [facility] Health Urgent Care Center at [facility] Get Driving Directions [phone] [address], [address] [place], [address]    [facility] Health Urgent Care Center at [facility] Get Driving Directions [phone] [address] [place], [place] [address]   [facility] Health Urgent Care Center [facility]) Get Driving Directions [phone] [address] [place], [place] [address]  [facility] Health Urgent Care Center [facility] [place]) Get Driving Directions [phone] [address] [place],  [place]  [address]  [facility] Health Urgent Care Center [facility] - at [employer]  [phone] [place],  [place] [address]   [facility] Health Urgent Care at [facility] Get Driving Directions [phone] [address] [place], [place] [address]   [facility] Health Urgent Care at [facility] Get Driving Directions  [phone] [address] [place], [place] [address]   [facility] Health Urgent Care at [facility] Directions [phone] [address] [place], [place] [address]  Your MyChart E-visit questionnaire answers were reviewed by a board certified advanced clinical practitioner to complete your personal care plan based on your specific symptoms.  Thank you for using e-Visits.

## 2022-12-15 ENCOUNTER — Other Ambulatory Visit: Payer: Self-pay

## 2022-12-15 LAB — URINE CULTURE: Culture: 50000 — AB

## 2022-12-15 NOTE — ED Provider Notes (Signed)
[facility]    ASSESSMENT & PLAN:  1. Cystitis    Meds ordered this encounter  Medications   cephALEXin (KEFLEX) 500 MG capsule    Sig: Take 1 capsule (500 mg total) by mouth 2 (two) times daily.    Dispense:  10 capsule    Refill:  0   fluconazole (DIFLUCAN) 150 MG tablet    Sig: Take one tablet by mouth as a single dose. May repeat in 3 days if symptoms persist.    Dispense:  2 tablet    Refill:  0   No signs of pyelonephritis. Urine culture sent. Will notify patient of any significant results. Ensure proper hydration. Will follow up with her PCP or here if not showing improvement over the next 48 hours, sooner if needed.  Outlined signs and symptoms indicating need for more acute intervention. Patient verbalized understanding. After Visit Summary given.  SUBJECTIVE:  Kristi Carter is a 62 y.o. female who complains of urinary frequency, urgency and dysuria for the past 2 days. Without associated flank pain, fever, chills, vaginal discharge or bleeding. Gross hematuria: not present. No specific aggravating or alleviating factors reported. No LE edema. Normal PO intake without n/v/d. Without specific abdominal pain. Ambulatory without difficulty. OTC treatment: none. H/O UTI: within past month; treated. LMP: No LMP recorded. Patient is postmenopausal.  OBJECTIVE:  Vitals:   12/13/22 1627 12/13/22 1630  BP:  (!) 140/63  Pulse:  100  Resp:  17  Temp:  98.4 F (36.9 C)  TempSrc:  Oral  SpO2:  96%  Weight: 67.1 kg   Height: 5\' 3"  (1.6 m)    General appearance: alert; no distress HENT: oropharynx: moist Skin: warm and dry Neurologic: normal gait Psychological: alert and cooperative; normal mood and affect    Allergies  Allergen Reactions   Other     Cats, Mold and Feathers   Prednisone     Other reaction(s): Vomiting   Tape     Past Medical History:  Diagnosis Date   A-fib (HCC)    Allergy    Antibiotic-induced yeast infection 07/20/2019    Anxiety    GERD (gastroesophageal reflux disease)    Hypothyroid    Social History   Socioeconomic History   Marital status: Married    Spouse name: Not on file   Number of children: Not on file   Years of education: Not on file   Highest education level: Not on file  Occupational History   Not on file  Tobacco Use   Smoking status: Never   Smokeless tobacco: Never  Vaping Use   Vaping status: Never Used  Substance and Sexual Activity   Alcohol use: Yes    Alcohol/week: 10.0 standard drinks of alcohol    Types: 10 Glasses of wine per week   Drug use: Never   Sexual activity: Not Currently    Birth control/protection: Post-menopausal  Other Topics Concern   Not on file  Social History Narrative   Not on file   Social Determinants of Health   Financial Resource Strain: Not on file  Food Insecurity: Not on file  Transportation Needs: Not on file  Physical Activity: Not on file  Stress: Not on file  Social Connections: Not on file  Intimate Partner Violence: Not on file   Family History  Problem Relation Age of Onset   Healthy Father    Cancer Paternal Uncle        started with prostate and spread to  entire body   Stroke Paternal Grandmother    Heart attack Paternal Glynda Jaeger, MD 12/15/22 [phone]

## 2022-12-16 ENCOUNTER — Ambulatory Visit: Admission: RE | Admit: 2022-12-16 | Payer: 59 | Source: Home / Self Care | Admitting: Gastroenterology

## 2022-12-16 SURGERY — ESOPHAGOGASTRODUODENOSCOPY (EGD) WITH PROPOFOL
Anesthesia: General

## 2022-12-24 ENCOUNTER — Other Ambulatory Visit: Payer: Self-pay | Admitting: Nurse Practitioner

## 2022-12-25 ENCOUNTER — Other Ambulatory Visit: Payer: Self-pay

## 2022-12-25 MED ORDER — CLONAZEPAM 0.5 MG PO TABS
0.5000 mg | ORAL_TABLET | Freq: Two times a day (BID) | ORAL | 1 refills | Status: DC | PRN
  Filled 2022-12-25: qty 60, 30d supply, fill #0
  Filled 2023-01-27: qty 60, 30d supply, fill #1

## 2023-01-12 ENCOUNTER — Other Ambulatory Visit (HOSPITAL_COMMUNITY): Payer: Self-pay

## 2023-01-15 ENCOUNTER — Telehealth: Payer: Self-pay | Admitting: Gastroenterology

## 2023-01-15 ENCOUNTER — Other Ambulatory Visit: Payer: Self-pay

## 2023-01-15 DIAGNOSIS — Z8719 Personal history of other diseases of the digestive system: Secondary | ICD-10-CM

## 2023-01-15 NOTE — Telephone Encounter (Signed)
Called patient back and she stated that she wanted to reschedule her egd. Therefore, she chose 02/11/2023.

## 2023-01-15 NOTE — Telephone Encounter (Signed)
Pt requesting call back to  reschedule  procedure

## 2023-01-18 ENCOUNTER — Other Ambulatory Visit: Payer: Self-pay | Admitting: Nurse Practitioner

## 2023-01-18 ENCOUNTER — Telehealth: Payer: 59 | Admitting: Physician Assistant

## 2023-01-18 ENCOUNTER — Encounter (INDEPENDENT_AMBULATORY_CARE_PROVIDER_SITE_OTHER): Payer: Self-pay

## 2023-01-18 DIAGNOSIS — B3731 Acute candidiasis of vulva and vagina: Secondary | ICD-10-CM

## 2023-01-18 DIAGNOSIS — R399 Unspecified symptoms and signs involving the genitourinary system: Secondary | ICD-10-CM | POA: Diagnosis not present

## 2023-01-18 MED ORDER — CEPHALEXIN 500 MG PO CAPS: 500.0000 mg | ORAL_CAPSULE | Freq: Two times a day (BID) | ORAL | 0 refills | Status: AC

## 2023-01-18 MED ORDER — FLUCONAZOLE 150 MG PO TABS: 150.0000 mg | ORAL_TABLET | Freq: Once | ORAL | 0 refills | Status: AC

## 2023-01-18 NOTE — Progress Notes (Signed)
E-Visit for Urinary Problems  We are sorry that you are not feeling well.  Here is how we plan to help!  Based on what you shared with me it looks like you most likely have a simple urinary tract infection.  A UTI (Urinary Tract Infection) is a bacterial infection of the bladder.  Most cases of urinary tract infections are simple to treat but a key part of your care is to encourage you to drink plenty of fluids and watch your symptoms carefully.  I have prescribed Keflex 500 mg twice a day for 7 days.  Your symptoms should gradually improve. Call us if the burning in your urine worsens, you develop worsening fever, back pain or pelvic pain or if your symptoms do not resolve after completing the antibiotic.  Urinary tract infections can be prevented by drinking plenty of water to keep your body hydrated.  Also be sure when you wipe, wipe from front to back and don't hold it in!  If possible, empty your bladder every 4 hours.  I will also send diflucan for yeast.   HOME CARE Drink plenty of fluids Compete the full course of the antibiotics even if the symptoms resolve Remember, when you need to go.go. Holding in your urine can increase the likelihood of getting a UTI! GET HELP RIGHT AWAY IF: You cannot urinate You get a high fever Worsening back pain occurs You see blood in your urine You feel sick to your stomach or throw up You feel like you are going to pass out  MAKE SURE YOU  Understand these instructions. Will watch your condition. Will get help right away if you are not doing well or get worse.   Thank you for choosing an e-visit.  Your e-visit answers were reviewed by a board certified advanced clinical practitioner to complete your personal care plan. Depending upon the condition, your plan could have included both over the counter or prescription medications.  Please review your pharmacy choice. Make sure the pharmacy is open so you can pick up prescription now. If there  is a problem, you may contact your provider through [employer] and have the prescription routed to another pharmacy.  Your safety is important to [place]. If you have drug allergies check your prescription carefully.   For the next 24 hours you can use MyChart to ask questions about today's visit, request a non-urgent call back, or ask for a work or school excuse. You will get an email in the next two days asking about your experience. I hope that your e-visit has been valuable and will speed your recovery.    have provided 5 minutes of non face to face time during this encounter for chart review and documentation.

## 2023-01-27 ENCOUNTER — Other Ambulatory Visit: Payer: Self-pay

## 2023-01-27 ENCOUNTER — Other Ambulatory Visit: Payer: Self-pay | Admitting: Nurse Practitioner

## 2023-01-27 ENCOUNTER — Other Ambulatory Visit (HOSPITAL_COMMUNITY): Payer: Self-pay

## 2023-01-28 ENCOUNTER — Other Ambulatory Visit: Payer: Self-pay

## 2023-01-28 ENCOUNTER — Other Ambulatory Visit (HOSPITAL_COMMUNITY): Payer: Self-pay

## 2023-01-28 MED ORDER — IBUPROFEN 800 MG PO TABS
800.0000 mg | ORAL_TABLET | Freq: Every day | ORAL | 0 refills | Status: DC | PRN
  Filled 2023-01-28: qty 90, 90d supply, fill #0

## 2023-01-31 ENCOUNTER — Telehealth: Payer: 59 | Admitting: Family Medicine

## 2023-01-31 DIAGNOSIS — R399 Unspecified symptoms and signs involving the genitourinary system: Secondary | ICD-10-CM

## 2023-01-31 MED ORDER — CEPHALEXIN 500 MG PO CAPS: 500.0000 mg | ORAL_CAPSULE | Freq: Two times a day (BID) | ORAL | 0 refills | Status: AC

## 2023-01-31 MED ORDER — FLUCONAZOLE 150 MG PO TABS: 150.0000 mg | ORAL_TABLET | Freq: Once | ORAL | 0 refills | Status: AC

## 2023-01-31 NOTE — Addendum Note (Signed)
Addended by: Georgana Curio on: 01/31/2023 03:05 PM   Modules accepted: Orders

## 2023-01-31 NOTE — Progress Notes (Signed)

## 2023-02-04 ENCOUNTER — Ambulatory Visit: Payer: 59 | Admitting: Urology

## 2023-02-04 ENCOUNTER — Other Ambulatory Visit: Payer: Self-pay

## 2023-02-04 ENCOUNTER — Encounter: Payer: Self-pay | Admitting: Urology

## 2023-02-04 VITALS — BP 150/83 | HR 71 | Ht 63.0 in | Wt 157.5 lb

## 2023-02-04 DIAGNOSIS — N390 Urinary tract infection, site not specified: Secondary | ICD-10-CM | POA: Diagnosis not present

## 2023-02-04 DIAGNOSIS — Z87440 Personal history of urinary (tract) infections: Secondary | ICD-10-CM

## 2023-02-04 DIAGNOSIS — R3129 Other microscopic hematuria: Secondary | ICD-10-CM

## 2023-02-04 LAB — MICROSCOPIC EXAMINATION

## 2023-02-04 LAB — URINALYSIS, COMPLETE
Bilirubin, UA: NEGATIVE
Glucose, UA: NEGATIVE
Ketones, UA: NEGATIVE
Leukocytes,UA: NEGATIVE
Nitrite, UA: NEGATIVE
Protein,UA: NEGATIVE
Specific Gravity, UA: 1.02 (ref 1.005–1.030)
Urobilinogen, Ur: 0.2 mg/dL (ref 0.2–1.0)
pH, UA: 6 (ref 5.0–7.5)

## 2023-02-04 LAB — BLADDER SCAN AMB NON-IMAGING: Scan Result: 67

## 2023-02-04 MED ORDER — ESTRADIOL 0.1 MG/GM VA CREA
TOPICAL_CREAM | VAGINAL | 12 refills | Status: AC
Start: 2023-02-04 — End: ?
  Filled 2023-02-04: qty 42.5, 30d supply, fill #0
  Filled 2023-03-03: qty 42.5, 30d supply, fill #1
  Filled 2023-04-02: qty 42.5, 30d supply, fill #2
  Filled 2023-05-03: qty 42.5, 30d supply, fill #3
  Filled 2023-05-30: qty 42.5, 30d supply, fill #4
  Filled 2023-06-29: qty 42.5, 30d supply, fill #5
  Filled 2023-07-29: qty 42.5, 30d supply, fill #6
  Filled 2023-09-22: qty 42.5, 30d supply, fill #7
  Filled 2023-10-18 – 2023-10-23 (×2): qty 42.5, 30d supply, fill #8
  Filled 2023-11-09 – 2023-11-16 (×2): qty 42.5, 30d supply, fill #9
  Filled 2023-12-10: qty 42.5, 30d supply, fill #10

## 2023-02-04 NOTE — Progress Notes (Signed)
Marcelle Overlie Plume,acting as a scribe for Vanna Scotland, MD.,have documented all relevant documentation on the behalf of Vanna Scotland, MD,as directed by  Vanna Scotland, MD while in the presence of Vanna Scotland, MD.  02/04/23 3:18 PM   Kristi Carter 1961-01-05 [id number]  Referring provider: Kara Dies, NP [address] [place],  [place] [address]  Chief Complaint  Patient presents with   Establish Care   Recurrent UTI    HPI: 62 year-old female who presents today for follow up of urinary tract infections.   Over the last several months, she has had at least 2 documented pan-sensitive urinary tract infections with traditional symptoms including urgency frequency and dysuria. She has also had a number of e-visits with multiple rounds of antibiotics, primarily Keflex but without urine culture or urinalysis data. Prior to August, urinary tract infections were fairly rare. She has no recent upper tract imaging. She is not on any UTI prevention strategies.   Her urinalysis today is negative, except for 3-10 RBC per high powered field.  She reports lower back pain, which she initially attributed to lifting heavy objects. She experiences difficulty urinating and significant pain during urination. No fever or chills reported, but experiences hot and cold flashes. She has a history of hematuria and has been to the ER twice in the past for this issue. No history of kidney stones, but tests were conducted in the past to rule them out. She is not sexually active. She reports constant diarrhea and stomach pain, possibly related to antibiotics.   Results for orders placed or performed in visit on 02/04/23  Bladder Scan (Post Void Residual) in office  Result Value Ref Range   Scan Result 67 ml     PMH: Past Medical History:  Diagnosis Date   A-fib (HCC)    Allergy    Antibiotic-induced yeast infection 07/20/2019   Anxiety    GERD (gastroesophageal reflux disease)    Hypothyroid      Surgical History: Past Surgical History:  Procedure Laterality Date   AUGMENTATION MAMMAPLASTY Bilateral 1990   saline   CARDIAC ELECTROPHYSIOLOGY MAPPING AND ABLATION     COLONOSCOPY WITH PROPOFOL N/A 04/13/2020   Procedure: COLONOSCOPY WITH PROPOFOL;  Surgeon: Wyline Mood, MD;  Location: [facility] ENDOSCOPY;  Service: Gastroenterology;  Laterality: N/A;   ESOPHAGOGASTRODUODENOSCOPY (EGD) WITH PROPOFOL N/A 04/13/2020   Procedure: ESOPHAGOGASTRODUODENOSCOPY (EGD) WITH PROPOFOL;  Surgeon: Wyline Mood, MD;  Location[facility] ENDOSCOPY;  Service: Gastroenterology;  Laterality: N/A;   ESOPHAGOGASTRODUODENOSCOPY (EGD) WITH PROPOFOL N/A 07/16/2020   Procedure: ESOPHAGOGASTRODUODENOSCOPY (EGD) WITH PROPOFOL;  Surgeon: Wyline Mood, MD;  Location: [facility] ENDOSCOPY;  Service: Gastroenterology;  Laterality: N/A;    Home Medications:  Allergies as of 02/04/2023       Reactions   Other    Cats, Mold and Feathers   Prednisone    Other reaction(s): Vomiting   Tape         Medication List        Accurate as of February 04, 2023  3:18 PM. If you have any questions, ask your nurse or doctor.          atorvastatin 40 MG tablet Commonly known as: LIPITOR Take 1 tablet (40 mg total) by mouth daily.   cephALEXin 500 MG capsule Commonly known as: KEFLEX Take 1 capsule (500 mg total) by mouth 2 (two) times daily for 7 days. What changed: Another medication with the same name was removed. Continue taking this medication, and follow the directions  you see here. Changed by: Vanna Scotland   clonazePAM 0.5 MG tablet Commonly known as: KLONOPIN Take 1 tablet (0.5 mg total) by mouth 2 (two) times daily as needed for anxiety.   esomeprazole 40 MG capsule Commonly known as: NEXIUM Take 1 capsule (40 mg total) by mouth daily.   estradiol 0.1 MG/GM vaginal cream Commonly known as: ESTRACE Apply pea sized amount to tip of finger to urethra before bed. Wash hands well after application. Use Monday,  Wednesday and Friday. Discard applicator. Started by: Vanna Scotland   fluconazole 150 MG tablet Commonly known as: Diflucan Take one tablet by mouth as a single dose. May repeat in 3 days if symptoms persist.   ibuprofen 800 MG tablet Commonly known as: ADVIL Take 1 tablet (800 mg total) by mouth daily as needed.   levothyroxine 50 MCG tablet Commonly known as: SYNTHROID Take 1 tablet (50 mcg total) by mouth every morning.        Allergies:  Allergies  Allergen Reactions   Other     Cats, Mold and Feathers   Prednisone     Other reaction(s): Vomiting   Tape     Family History: Family History  Problem Relation Age of Onset   Healthy Father    Cancer Paternal Uncle        started with prostate and spread to entire body   Stroke Paternal Grandmother    Heart attack Paternal Grandfather     Social History:  reports that she has never smoked. She has never used smokeless tobacco. She reports current alcohol use of about 10.0 standard drinks of alcohol per week. She reports that she does not use drugs.   Physical Exam: BP (!) 150/83   Pulse 71   Ht 5\' 3"  (1.6 m)   Wt 157 lb 8 oz (71.4 kg)   BMI 27.90 kg/m   Constitutional:  Alert and oriented, No acute distress. HEENT: Bells AT, moist mucus membranes.  Trachea midline, no masses. Neurologic: Grossly intact, no focal deficits, moving all 4 extremities. Psychiatric: Normal mood and affect.   Assessment & Plan:    1. Recurrent UTI - PVR today is 67 mL, she is emptying her bladder reasonably well - Urinalysis today is negative  - Recurrent UTI Prevention Strategies  Stay well hydrated. Get a moderate amount of exercise. Eat a diet rich in fruit and vegetables. Start a bowel regimen to manage your constipation. Your goal is to have consistent, formed bowel movements that are easy for you to pass. You may use either of the over-the-counter supplements Benefiber or Miralax to help with this. I recommend that you try  Benefiber first and move on to Miralax if this is not helping you enough. You may adjust the recommended dose of Miralax (one capful daily) to achieve this goal. Start taking an over-the-counter cranberry supplement for urinary tract health. Take this once or twice daily on an empty stomach, e.g. right before bed. Start taking an over-the-counter d-mannose supplement. Take this daily per packaging instructions. Start taking an over-the-counter probiotic containing the bacterial species called Lactobacillus. Take this daily. Start vaginal estrogen cream. Apply a pea-sized amount around the opening of the urethra every day for 2 weeks, then three times weekly forever.  2. Microscopic hematuria - May be the result of her recent back to back infections - We will have her come back in a few months and repeat this. If it persists, we will recommend a hematuria workup - She is  currently on Keflex. - Order renal ultrasound to evaluate for kidney stones or other renal pathology.   Return in about 1 month (around 03/06/2023) for reevaluation of urinaylsis and review of ultrasound results.  I have reviewed the above documentation for accuracy and completeness, and I agree with the above.   Vanna Scotland, MD    [facility] [address] [place], [place] [address] [phone]

## 2023-02-04 NOTE — Patient Instructions (Signed)
For UTI prevention take cranberry tablets, probiotic, D-Mannose daily.

## 2023-02-09 ENCOUNTER — Other Ambulatory Visit: Payer: Self-pay

## 2023-02-09 MED ORDER — FLUCONAZOLE 150 MG PO TABS
150.0000 mg | ORAL_TABLET | Freq: Once | ORAL | 1 refills | Status: AC
  Filled 2023-02-09: qty 1, 1d supply, fill #0

## 2023-02-10 ENCOUNTER — Encounter: Payer: Self-pay | Admitting: Gastroenterology

## 2023-02-11 ENCOUNTER — Ambulatory Visit: Payer: 59 | Admitting: Registered Nurse

## 2023-02-11 ENCOUNTER — Ambulatory Visit
Admission: RE | Admit: 2023-02-11 | Discharge: 2023-02-11 | Disposition: A | Discharge status: Home / Self Care | Payer: 59 | Attending: Gastroenterology | Admitting: Gastroenterology

## 2023-02-11 ENCOUNTER — Encounter: Payer: Self-pay | Admitting: Gastroenterology

## 2023-02-11 ENCOUNTER — Encounter
Admission: RE | Disposition: A | Discharge status: Home / Self Care | Payer: Self-pay | Source: Home / Self Care | Attending: Gastroenterology

## 2023-02-11 DIAGNOSIS — K219 Gastro-esophageal reflux disease without esophagitis: Secondary | ICD-10-CM | POA: Insufficient documentation

## 2023-02-11 DIAGNOSIS — F419 Anxiety disorder, unspecified: Secondary | ICD-10-CM | POA: Diagnosis not present

## 2023-02-11 DIAGNOSIS — Z8719 Personal history of other diseases of the digestive system: Secondary | ICD-10-CM

## 2023-02-11 DIAGNOSIS — E039 Hypothyroidism, unspecified: Secondary | ICD-10-CM | POA: Insufficient documentation

## 2023-02-11 DIAGNOSIS — I4891 Unspecified atrial fibrillation: Secondary | ICD-10-CM | POA: Diagnosis not present

## 2023-02-11 DIAGNOSIS — K2270 Barrett's esophagus without dysplasia: Secondary | ICD-10-CM | POA: Insufficient documentation

## 2023-02-11 HISTORY — PX: ESOPHAGOGASTRODUODENOSCOPY (EGD) WITH PROPOFOL: SHX5813

## 2023-02-11 HISTORY — PX: BIOPSY: SHX5522

## 2023-02-11 SURGERY — ESOPHAGOGASTRODUODENOSCOPY (EGD) WITH PROPOFOL
Anesthesia: General

## 2023-02-11 MED ORDER — PROPOFOL 10 MG/ML IV BOLUS
INTRAVENOUS | Status: AC
  Filled 2023-02-11: qty 20

## 2023-02-11 MED ORDER — SODIUM CHLORIDE 0.9 % IV SOLN: INTRAVENOUS | Status: DC

## 2023-02-11 MED ORDER — PROPOFOL 10 MG/ML IV BOLUS
INTRAVENOUS | Status: DC | PRN
  Administered 2023-02-11: 30 mg via INTRAVENOUS
  Administered 2023-02-11: 130 mg via INTRAVENOUS
  Administered 2023-02-11: 70 mg via INTRAVENOUS
  Administered 2023-02-11: 30 mg via INTRAVENOUS

## 2023-02-11 MED ORDER — GLYCOPYRROLATE 0.2 MG/ML IJ SOLN
INTRAMUSCULAR | Status: DC | PRN
  Administered 2023-02-11: .2 mg via INTRAVENOUS

## 2023-02-11 MED ORDER — LIDOCAINE HCL (CARDIAC) PF 100 MG/5ML IV SOSY
PREFILLED_SYRINGE | INTRAVENOUS | Status: DC | PRN
  Administered 2023-02-11: 100 mg via INTRAVENOUS

## 2023-02-11 NOTE — H&P (Signed)
Wyline Mood, MD [address], [address], [place], [place], [address] [address], [place], [place], [address] Phone: [phone]  Fax: [phone]  Primary Care Physician:  Kara Dies, NP   Pre-Procedure History & Physical: HPI:  NORMANDY ZITTEL is a 62 y.o. female is here for an endoscopy    Past Medical History:  Diagnosis Date   A-fib [facility])    Allergy    Antibiotic-induced yeast infection 07/20/2019   Anxiety    GERD (gastroesophageal reflux disease)    Hypothyroid     Past Surgical History:  Procedure Laterality Date   AUGMENTATION MAMMAPLASTY Bilateral 1990   saline   CARDIAC ELECTROPHYSIOLOGY MAPPING AND ABLATION     COLONOSCOPY WITH PROPOFOL N/A 04/13/2020   Procedure: COLONOSCOPY WITH PROPOFOL;  Surgeon: Wyline Mood, MD;  Location: [facility] ENDOSCOPY;  Service: Gastroenterology;  Laterality: N/A;   ESOPHAGOGASTRODUODENOSCOPY (EGD) WITH PROPOFOL N/A 04/13/2020   Procedure: ESOPHAGOGASTRODUODENOSCOPY (EGD) WITH PROPOFOL;  Surgeon: Wyline Mood, MD;  Location: [facility] ENDOSCOPY;  Service: Gastroenterology;  Laterality: N/A;   ESOPHAGOGASTRODUODENOSCOPY (EGD) WITH PROPOFOL N/A 07/16/2020   Procedure: ESOPHAGOGASTRODUODENOSCOPY (EGD) WITH PROPOFOL;  Surgeon: Wyline Mood, MD;  Location: [facility] ENDOSCOPY;  Service: Gastroenterology;  Laterality: N/A;    Prior to Admission medications   Medication Sig Start Date End Date Taking? Authorizing Provider  levothyroxine (SYNTHROID) 50 MCG tablet Take 1 tablet (50 mcg total) by mouth every morning. 02/05/22  Yes Shamleffer, Konrad Dolores, MD  atorvastatin (LIPITOR) 40 MG tablet Take 1 tablet (40 mg total) by mouth daily. 07/21/22 04/19/23  Kara Dies, NP  clonazePAM (KLONOPIN) 0.5 MG tablet Take 1 tablet (0.5 mg total) by mouth 2 (two) times daily as needed for anxiety. 12/25/22   Kara Dies, NP  esomeprazole (NEXIUM) 40 MG capsule Take 1 capsule (40 mg total) by mouth daily. 07/21/22   Kara Dies,  NP  estradiol (ESTRACE) 0.1 MG/GM vaginal cream Apply pea sized amount to tip of finger to urethra before bed. Wash hands well after application. Use Monday, Wednesday and Friday. Discard applicator. 02/04/23   Vanna Scotland, MD  fluconazole (DIFLUCAN) 150 MG tablet Take one tablet by mouth as a single dose. May repeat in 3 days if symptoms persist. 12/13/22   Mardella Layman, MD  fluconazole (DIFLUCAN) 150 MG tablet Take 1 tablet (150 mg total) by mouth once for 1 dose. 02/09/23 02/11/23  Vanna Scotland, MD  ibuprofen (ADVIL) 800 MG tablet Take 1 tablet (800 mg total) by mouth daily as needed. 01/28/23   Worthy Rancher B, FNP    Allergies as of 01/15/2023 - Review Complete 12/13/2022  Allergen Reaction Noted   Other  07/15/2019   Prednisone  12/27/2018   Tape  07/12/2019    Family History  Problem Relation Age of Onset   Healthy Father    Cancer Paternal Uncle        started with prostate and spread to entire body   Stroke Paternal Grandmother    Heart attack Paternal Grandfather     Social History   Socioeconomic History   Marital status: Married    Spouse name: Not on file   Number of children: Not on file   Years of education: Not on file   Highest education level: Not on file  Occupational History   Not on file  Tobacco Use   Smoking status: Never   Smokeless tobacco: Never  Vaping Use   Vaping status: Never Used  Substance and Sexual Activity  Alcohol use: Yes    Alcohol/week: 10.0 standard drinks of alcohol    Types: 10 Glasses of wine per week   Drug use: Never   Sexual activity: Not Currently    Birth control/protection: Post-menopausal  Other Topics Concern   Not on file  Social History Narrative   Not on file   Social Determinants of Health   Financial Resource Strain: Not on file  Food Insecurity: Not on file  Transportation Needs: Not on file  Physical Activity: Not on file  Stress: Not on file  Social Connections: Not on file  Intimate Partner  Violence: Not on file    Review of Systems: See HPI, otherwise negative ROS  Physical Exam: BP (!) 146/89   Pulse 82   Temp (!) 96.5 F (35.8 C) (Temporal)   Resp 17   Ht 5\' 3"  (1.6 m)   Wt 71.2 kg   SpO2 100%   BMI 27.81 kg/m  General:   Alert,  pleasant and cooperative in NAD Head:  Normocephalic and atraumatic. Neck:  Supple; no masses or thyromegaly. Lungs:  Clear throughout to auscultation, normal respiratory effort.    Heart:  +S1, +S2, Regular rate and rhythm, No edema. Abdomen:  Soft, nontender and nondistended. Normal bowel sounds, without guarding, and without rebound.   Neurologic:  Alert and  oriented x4;  grossly normal neurologically.  Impression/Plan: STEPHENIE LARABEE is here for an endoscopy  to be performed for  evaluation of barrettes esophagus    Risks, benefits, limitations, and alternatives regarding endoscopy have been reviewed with the patient.  Questions have been answered.  All parties agreeable.   Wyline Mood, MD  02/11/2023, 9:17 AM

## 2023-02-11 NOTE — Anesthesia Postprocedure Evaluation (Signed)
Anesthesia Post Note  Patient: Kristi Carter  Procedure(s) Performed: ESOPHAGOGASTRODUODENOSCOPY (EGD) WITH PROPOFOL BIOPSY  Patient location during evaluation: Endoscopy Anesthesia Type: General Level of consciousness: awake and alert Pain management: pain level controlled Vital Signs Assessment: post-procedure vital signs reviewed and stable Respiratory status: spontaneous breathing, nonlabored ventilation, respiratory function stable and patient connected to nasal cannula oxygen Cardiovascular status: blood pressure returned to baseline and stable Postop Assessment: no apparent nausea or vomiting Anesthetic complications: no   No notable events documented.   Last Vitals:  Vitals:   02/11/23 0907 02/11/23 1008  BP: (!) 146/89 125/70  Pulse: 82 (!) 101  Resp: 17 11  Temp: (!) 35.8 C 36.4 C  SpO2: 100% 97%    Last Pain:  Vitals:   02/11/23 1028  TempSrc:   PainSc: 0-No pain                 Corinda Gubler

## 2023-02-11 NOTE — Transfer of Care (Signed)
Immediate Anesthesia Transfer of Care Note  Patient: Kristi Carter  Procedure(s) Performed: ESOPHAGOGASTRODUODENOSCOPY (EGD) WITH PROPOFOL BIOPSY  Patient Location: PACU  Anesthesia Type:General  Level of Consciousness: awake, alert , and oriented  Airway & Oxygen Therapy: Patient Spontanous Breathing and Patient connected to nasal cannula oxygen  Post-op Assessment: Report given to RN and Post -op Vital signs reviewed and stable  Post vital signs: Reviewed and stable  Last Vitals:  Vitals Value Taken Time  BP 125/70 02/11/23 1010  Temp 97.5 02/11/23 1010  Pulse 100 02/11/23 1010  Resp 16 02/11/23 1010  SpO2 95% 02/11/23 1010    Last Pain:  Vitals:   02/11/23 1008  TempSrc: Temporal  PainSc: 0-No pain         Complications: No notable events documented.

## 2023-02-11 NOTE — Anesthesia Preprocedure Evaluation (Signed)
Anesthesia Evaluation  Patient identified by MRN, date of birth, ID band Patient awake    Reviewed: Allergy & Precautions, NPO status , Patient's Chart, lab work & pertinent test results  History of Anesthesia Complications Negative for: history of anesthetic complications  Airway Mallampati: II  TM Distance: >3 FB Neck ROM: Full    Dental no notable dental hx. (+) Teeth Intact   Pulmonary neg pulmonary ROS, neg sleep apnea, neg COPD, Patient abstained from smoking.Not current smoker   Pulmonary exam normal breath sounds clear to auscultation       Cardiovascular Exercise Tolerance: Good METS(-) hypertension(-) CAD and (-) Past MI negative cardio ROS (-) dysrhythmias  Rhythm:Regular Rate:Normal - Systolic murmurs    Neuro/Psych  PSYCHIATRIC DISORDERS Anxiety     negative neurological ROS     GI/Hepatic ,GERD  Medicated,,(+)     (-) substance abuse    Endo/Other  neg diabetesHypothyroidism    Renal/GU negative Renal ROS     Musculoskeletal   Abdominal   Peds  Hematology   Anesthesia Other Findings Past Medical History: No date: A-fib (HCC) No date: Allergy 07/20/2019: Antibiotic-induced yeast infection No date: Anxiety No date: GERD (gastroesophageal reflux disease) No date: Hypothyroid  Reproductive/Obstetrics                             Anesthesia Physical Anesthesia Plan  ASA: 2  Anesthesia Plan: General   Post-op Pain Management: Minimal or no pain anticipated   Induction: Intravenous  PONV Risk Score and Plan: 3 and Propofol infusion, TIVA and Ondansetron  Airway Management Planned: Nasal Cannula  Additional Equipment: None  Intra-op Plan:   Post-operative Plan:   Informed Consent: I have reviewed the patients History and Physical, chart, labs and discussed the procedure including the risks, benefits and alternatives for the proposed anesthesia with the patient or  authorized representative who has indicated his/her understanding and acceptance.     Dental advisory given  Plan Discussed with: CRNA and Surgeon  Anesthesia Plan Comments: (Discussed risks of anesthesia with patient, including possibility of difficulty with spontaneous ventilation under anesthesia necessitating airway intervention, PONV, and rare risks such as cardiac or respiratory or neurological events, and allergic reactions. Discussed the role of CRNA in patient's perioperative care. Patient understands.)       Anesthesia Quick Evaluation

## 2023-02-11 NOTE — Op Note (Signed)
[facility] Gastroenterology Patient Name: Kristi Carter Procedure Date: 02/11/2023 9:43 AM MRN: [id number] Account #: [id number] Date of Birth: Dec 18, 1960 Admit Type: Outpatient Age: 62 Room: [facility] ENDO ROOM 3 Gender: Female Note Status: Finalized Instrument Name: Upper Endoscope [id number] Procedure:             Upper GI endoscopy Indications:           Surveillance for malignancy due to personal history of                         Barrett's esophagus Providers:             Wyline Mood MD, MD Medicines:             Monitored Anesthesia Care Complications:         No immediate complications. Procedure:             Pre-Anesthesia Assessment:                        - Prior to the procedure, a History and Physical was                         performed, and patient medications, allergies and                         sensitivities were reviewed. The patient's tolerance                         of previous anesthesia was reviewed.                        - The risks and benefits of the procedure and the                         sedation options and risks were discussed with the                         patient. All questions were answered and informed                         consent was obtained.                        - ASA Grade Assessment: II - A patient with mild                         systemic disease.                        After obtaining informed consent, the endoscope was                         passed under direct vision. Throughout the procedure,                         the patient's blood pressure, pulse, and oxygen                         saturations were monitored continuously. The Endoscope  was introduced through the mouth, and advanced to the                         third part of duodenum. The upper GI endoscopy was                         accomplished with ease. The patient tolerated the                         procedure  well. Findings:      The examined duodenum was normal.      The stomach was normal.      The esophagus and gastroesophageal junction were examined with white       light. There were esophageal mucosal changes consistent with       long-segment Barrett's esophagus. These changes involved the mucosa at       the upper extent of the gastric folds (35 cm from the incisors)       extending to the Z-line (25 cm from the incisors). No visible       abnormalities were present. The maximum longitudinal extent of these       esophageal mucosal changes was 10 cm in length. Mucosa was biopsied with       a cold forceps for histology in 4 quadrants at intervals of 2 cm. The       following biopsy specimens were sent to pathology: 4 quadrant biopsies       from 25 to 35 cm from the incisors (5th Bottle). A total of 4 specimen       bottles were sent to pathology. Impression:            - Normal examined duodenum.                        - Normal stomach.                        - Esophageal mucosal changes consistent with                         long-segment Barrett's esophagus. Biopsied. Recommendation:        - Discharge patient to home (with escort).                        - Resume previous diet.                        - Continue present medications.                        - Await pathology results.                        - Return to my office as previously scheduled. Procedure Code(s):     --- Professional ---                        [phone], Esophagogastroduodenoscopy, flexible,                         transoral; with biopsy, single or multiple Diagnosis Code(s):     --- Professional ---  K22.70, Barrett's esophagus without dysplasia CPT copyright 2022 American Medical Association. All rights reserved. The codes documented in this report are preliminary and upon coder review may  be revised to meet current compliance requirements. Wyline Mood, MD Wyline Mood MD, MD 02/11/2023  10:06:51 AM This report has been signed electronically. Number of Addenda: 0 Note Initiated On: 02/11/2023 9:43 AM Estimated Blood Loss:  Estimated blood loss: none.      [facility]

## 2023-02-12 ENCOUNTER — Encounter: Payer: Self-pay | Admitting: Gastroenterology

## 2023-02-16 ENCOUNTER — Encounter: Payer: Self-pay | Admitting: Gastroenterology

## 2023-02-16 LAB — SURGICAL PATHOLOGY

## 2023-02-26 ENCOUNTER — Other Ambulatory Visit (HOSPITAL_COMMUNITY): Payer: Self-pay

## 2023-02-26 ENCOUNTER — Other Ambulatory Visit: Payer: Self-pay | Admitting: Nurse Practitioner

## 2023-02-27 ENCOUNTER — Other Ambulatory Visit: Payer: Self-pay

## 2023-02-27 MED ORDER — CLONAZEPAM 0.5 MG PO TABS
0.5000 mg | ORAL_TABLET | Freq: Two times a day (BID) | ORAL | 1 refills | Status: DC | PRN
  Filled 2023-02-27: qty 60, 30d supply, fill #0
  Filled 2023-03-26: qty 60, 30d supply, fill #1

## 2023-03-03 ENCOUNTER — Other Ambulatory Visit: Payer: Self-pay

## 2023-03-04 ENCOUNTER — Encounter: Payer: Self-pay | Admitting: Nurse Practitioner

## 2023-03-05 ENCOUNTER — Ambulatory Visit: Payer: 59 | Admitting: Nurse Practitioner

## 2023-03-05 ENCOUNTER — Encounter: Payer: Self-pay | Admitting: Nurse Practitioner

## 2023-03-05 VITALS — BP 114/68 | HR 83 | Temp 97.7°F | Ht 63.0 in | Wt 154.0 lb

## 2023-03-05 DIAGNOSIS — M549 Dorsalgia, unspecified: Secondary | ICD-10-CM | POA: Diagnosis not present

## 2023-03-05 DIAGNOSIS — R300 Dysuria: Secondary | ICD-10-CM

## 2023-03-05 LAB — POC URINALSYSI DIPSTICK (AUTOMATED)
Bilirubin, UA: NEGATIVE
Glucose, UA: POSITIVE — AB
Ketones, UA: NEGATIVE
Nitrite, UA: POSITIVE
Protein, UA: NEGATIVE
Spec Grav, UA: <=1.005 — AB (ref 1.010–1.025)
Urobilinogen, UA: 0.2 U/dL
pH, UA: 5 (ref 5.0–8.0)

## 2023-03-05 MED ORDER — FLUCONAZOLE 150 MG PO TABS: 150.0000 mg | ORAL_TABLET | Freq: Every day | ORAL | 0 refills | Status: DC

## 2023-03-05 MED ORDER — CEFDINIR 300 MG PO CAPS: 300.0000 mg | ORAL_CAPSULE | Freq: Two times a day (BID) | ORAL | 0 refills | Status: AC

## 2023-03-05 MED ORDER — CEFDINIR 300 MG PO CAPS: 300.0000 mg | ORAL_CAPSULE | Freq: Two times a day (BID) | ORAL | 0 refills | Status: DC

## 2023-03-05 NOTE — Assessment & Plan Note (Signed)
Urinalysis positive for large leukocytes, nitrite trace blood. Given patient symptoms will treat with cefdinir. Urine culture pending. Advised patient to increase fluid intake and take  over-the-counter Azo for pain. Patient will let us know if symptoms not improving.

## 2023-03-05 NOTE — Progress Notes (Signed)
Established Patient Office Visit  Subjective:  Patient ID: Kristi Carter, female    DOB: 11/30/1960  Age: 62 y.o. MRN: [id number]  CC:  Chief Complaint  Patient presents with   Urinary Tract Infection    HPI  [name] presents for symptoms of UTI.  She had recurrent episodes of UTI since August every month and treated with Bactrim and multiple rounds of cephalexin.  She had seen urology on 02/04/2023 was started on estradiol and has been scheduled for renal ultrasound Urinary Tract Infection  This is a recurrent problem. The current episode started yesterday. The problem occurs every urination. The problem has been unchanged. She is Not sexually active. Associated symptoms include frequency, hesitancy and urgency. Pertinent negatives include no discharge. She has tried increased fluids and NSAIDs for the symptoms. The treatment provided mild relief.     Past Medical History:  Diagnosis Date   A-fib [facility])    Allergy    Antibiotic-induced yeast infection 07/20/2019   Anxiety    GERD (gastroesophageal reflux disease)    Hypothyroid     Past Surgical History:  Procedure Laterality Date   AUGMENTATION MAMMAPLASTY Bilateral 1990   saline   BIOPSY  02/11/2023   Procedure: BIOPSY;  Surgeon: Wyline Mood, MD;  Location: [facility] ENDOSCOPY;  Service: Gastroenterology;;   CARDIAC ELECTROPHYSIOLOGY MAPPING AND ABLATION     COLONOSCOPY WITH PROPOFOL N/A 04/13/2020   Procedure: COLONOSCOPY WITH PROPOFOL;  Surgeon: Wyline Mood, MD;  Location: [facility] ENDOSCOPY;  Service: Gastroenterology;  Laterality: N/A;   ESOPHAGOGASTRODUODENOSCOPY (EGD) WITH PROPOFOL N/A 04/13/2020   Procedure: ESOPHAGOGASTRODUODENOSCOPY (EGD) WITH PROPOFOL;  Surgeon: Wyline Mood, MD;  Location: [facility] ENDOSCOPY;  Service: Gastroenterology;  Laterality: N/A;   ESOPHAGOGASTRODUODENOSCOPY (EGD) WITH PROPOFOL N/A 07/16/2020   Procedure: ESOPHAGOGASTRODUODENOSCOPY (EGD) WITH PROPOFOL;  Surgeon: Wyline Mood, MD;  Location:  [facility] ENDOSCOPY;  Service: Gastroenterology;  Laterality: N/A;   ESOPHAGOGASTRODUODENOSCOPY (EGD) WITH PROPOFOL N/A 02/11/2023   Procedure: ESOPHAGOGASTRODUODENOSCOPY (EGD) WITH PROPOFOL;  Surgeon: Wyline Mood, MD;  Location: [facility] ENDOSCOPY;  Service: Gastroenterology;  Laterality: N/A;    Family History  Problem Relation Age of Onset   Healthy Father    Cancer Paternal Uncle        started with prostate and spread to entire body   Stroke Paternal Grandmother    Heart attack Paternal Grandfather     Social History   Socioeconomic History   Marital status: Married    Spouse name: Not on file   Number of children: Not on file   Years of education: Not on file   Highest education level: Not on file  Occupational History   Not on file  Tobacco Use   Smoking status: Never   Smokeless tobacco: Never  Vaping Use   Vaping status: Never Used  Substance and Sexual Activity   Alcohol use: Yes    Alcohol/week: 10.0 standard drinks of alcohol    Types: 10 Glasses of wine per week   Drug use: Never   Sexual activity: Not Currently    Birth control/protection: Post-menopausal  Other Topics Concern   Not on file  Social History Narrative   Not on file   Social Drivers of Health   Financial Resource Strain: Not on file  Food Insecurity: Not on file  Transportation Needs: Not on file  Physical Activity: Not on file  Stress: Not on file  Social Connections: Not on file  Intimate Partner Violence: Not on file     Outpatient Medications Prior  to Visit  Medication Sig Dispense Refill   atorvastatin (LIPITOR) 40 MG tablet Take 1 tablet (40 mg total) by mouth daily. 90 tablet 3   clonazePAM (KLONOPIN) 0.5 MG tablet Take 1 tablet (0.5 mg total) by mouth 2 (two) times daily as needed for anxiety. 60 tablet 1   esomeprazole (NEXIUM) 40 MG capsule Take 1 capsule (40 mg total) by mouth daily. 90 capsule 3   estradiol (ESTRACE) 0.1 MG/GM vaginal cream Apply pea sized amount to tip of finger  to urethra before bed. Wash hands well after application. Use Monday, Wednesday and Friday. Discard applicator. 42.5 g 12   fluconazole (DIFLUCAN) 150 MG tablet Take one tablet by mouth as a single dose. May repeat in 3 days if symptoms persist. 2 tablet 0   ibuprofen (ADVIL) 800 MG tablet Take 1 tablet (800 mg total) by mouth daily as needed. 90 tablet 0   levothyroxine (SYNTHROID) 50 MCG tablet Take 1 tablet (50 mcg total) by mouth every morning. 90 tablet 3   No facility-administered medications prior to visit.    Allergies  Allergen Reactions   Other     Cats, Mold and Feathers   Prednisone     Other reaction(s): Vomiting   Tape     ROS Review of Systems  Genitourinary:  Positive for frequency, hesitancy and urgency.   Negative unless indicated in HPI.    Objective:    Physical Exam Constitutional:      Appearance: Normal appearance.  Cardiovascular:     Rate and Rhythm: Normal rate and regular rhythm.     Pulses: Normal pulses.     Heart sounds: Normal heart sounds.  Abdominal:     General: Bowel sounds are normal.     Palpations: Abdomen is soft.     Tenderness: There is no abdominal tenderness. There is no right CVA tenderness or left CVA tenderness.  Musculoskeletal:     Cervical back: Normal range of motion.  Neurological:     General: No focal deficit present.     Mental Status: She is alert. Mental status is at baseline.  Psychiatric:        Mood and Affect: Mood normal.        Behavior: Behavior normal.        Thought Content: Thought content normal.        Judgment: Judgment normal.     BP 114/68   Pulse 83   Temp 97.7 F (36.5 C)   Ht 5\' 3"  (1.6 m)   Wt 154 lb (69.9 kg)   SpO2 95%   BMI 27.28 kg/m  Wt Readings from Last 3 Encounters:  03/05/23 154 lb (69.9 kg)  02/11/23 157 lb (71.2 kg)  02/04/23 157 lb 8 oz (71.4 kg)     Health Maintenance  Topic Date Due   HIV Screening  Never done   Hepatitis C Screening  Never done   COVID-19  Vaccine (4 - 2024-25 season) 11/09/2022   INFLUENZA VACCINE  06/08/2023 (Originally 10/09/2022)   MAMMOGRAM  01/25/2024   DTaP/Tdap/Td (2 - Td or Tdap) 09/21/2024   Cervical Cancer Screening (HPV/Pap Cotest)  06/09/2027   Colonoscopy  04/13/2030   HPV VACCINES  Aged Out   Zoster Vaccines- Shingrix  Discontinued    There are no preventive care reminders to display for this patient.  Lab Results  Component Value Date   TSH 0.77 10/28/2022   Lab Results  Component Value Date   WBC 5.7 10/28/2022  HGB 13.2 10/28/2022   HCT 40.4 10/28/2022   MCV 101.8 (H) 10/28/2022   PLT 326.0 10/28/2022   Lab Results  Component Value Date   NA 138 10/28/2022   K 3.7 10/28/2022   CO2 28 10/28/2022   GLUCOSE 82 10/28/2022   BUN 10 10/28/2022   CREATININE 0.54 10/28/2022   BILITOT 0.6 10/28/2022   ALKPHOS 92 10/28/2022   AST 34 10/28/2022   ALT 19 10/28/2022   PROT 6.7 10/28/2022   ALBUMIN 3.8 10/28/2022   CALCIUM 8.5 10/28/2022   ANIONGAP 14 08/21/2020   GFR 100.05 10/28/2022   Lab Results  Component Value Date   CHOL 196 10/28/2022   Lab Results  Component Value Date   HDL 63.30 10/28/2022   Lab Results  Component Value Date   LDLCALC 85 11/13/2021   Lab Results  Component Value Date   TRIG 231.0 (H) 10/28/2022   Lab Results  Component Value Date   CHOLHDL 3 10/28/2022   Lab Results  Component Value Date   HGBA1C 5.2 12/17/2020      Assessment & Plan:  Dysuria Assessment & Plan: Urinalysis positive for large leukocytes, nitrite trace blood. Given patient symptoms will treat with cefdinir. Urine culture pending. Advised patient to increase fluid intake and take  over-the-counter Azo for pain. Patient will let us know if symptoms not improving.   Orders: -     POCT Urinalysis Dipstick (Automated) -     Urine Culture -     Urinalysis, Routine w reflex microscopic  Back pain, unspecified back location, unspecified back pain laterality, unspecified  chronicity  Other orders -     Cefdinir; Take 1 capsule (300 mg total) by mouth 2 (two) times daily for 7 days.  Dispense: 14 capsule; Refill: 0 -     Fluconazole; Take 1 tablet (150 mg total) by mouth daily.  Dispense: 2 tablet; Refill: 0    Follow-up: No follow-ups on file.   Kara Dies, NP

## 2023-03-06 DIAGNOSIS — R300 Dysuria: Secondary | ICD-10-CM | POA: Diagnosis not present

## 2023-03-06 LAB — URINALYSIS, ROUTINE W REFLEX MICROSCOPIC
Bilirubin Urine: NEGATIVE
Hgb urine dipstick: NEGATIVE
Ketones, ur: NEGATIVE
Nitrite: POSITIVE — AB
Specific Gravity, Urine: <=1.005 — AB (ref 1.000–1.030)
Total Protein, Urine: NEGATIVE
Urine Glucose: 100 — AB
Urobilinogen, UA: 1 (ref 0.0–1.0)
pH: 5.5 (ref 5.0–8.0)

## 2023-03-08 LAB — URINE CULTURE
MICRO NUMBER:: 15894377
Result:: NO GROWTH
SPECIMEN QUALITY:: ADEQUATE

## 2023-03-09 ENCOUNTER — Ambulatory Visit
Admission: RE | Admit: 2023-03-09 | Discharge: 2023-03-09 | Disposition: A | Discharge status: Home / Self Care | Payer: 59 | Source: Ambulatory Visit | Attending: Urology | Admitting: Urology

## 2023-03-09 DIAGNOSIS — N390 Urinary tract infection, site not specified: Secondary | ICD-10-CM | POA: Insufficient documentation

## 2023-03-10 ENCOUNTER — Ambulatory Visit: Payer: 59 | Admitting: Physician Assistant

## 2023-03-11 ENCOUNTER — Other Ambulatory Visit: Payer: Self-pay | Admitting: Internal Medicine

## 2023-03-15 ENCOUNTER — Other Ambulatory Visit: Payer: Self-pay | Admitting: Internal Medicine

## 2023-03-16 ENCOUNTER — Encounter: Payer: Self-pay | Admitting: Obstetrics and Gynecology

## 2023-03-17 ENCOUNTER — Other Ambulatory Visit: Payer: Self-pay | Admitting: Obstetrics and Gynecology

## 2023-03-17 ENCOUNTER — Ambulatory Visit: Payer: 59 | Admitting: Physician Assistant

## 2023-03-17 ENCOUNTER — Other Ambulatory Visit: Payer: Self-pay | Admitting: Internal Medicine

## 2023-03-17 DIAGNOSIS — A6004 Herpesviral vulvovaginitis: Secondary | ICD-10-CM

## 2023-03-17 MED ORDER — VALACYCLOVIR HCL 500 MG PO TABS: 500.0000 mg | ORAL_TABLET | Freq: Two times a day (BID) | ORAL | 0 refills | Status: AC

## 2023-03-17 NOTE — Progress Notes (Signed)
Rx RF valtrex prn sx 

## 2023-03-19 ENCOUNTER — Other Ambulatory Visit (HOSPITAL_COMMUNITY): Payer: Self-pay

## 2023-03-23 ENCOUNTER — Other Ambulatory Visit (HOSPITAL_COMMUNITY): Payer: Self-pay

## 2023-03-24 ENCOUNTER — Encounter: Payer: Self-pay | Admitting: Urology

## 2023-03-24 ENCOUNTER — Other Ambulatory Visit (HOSPITAL_COMMUNITY): Payer: Self-pay

## 2023-03-24 ENCOUNTER — Other Ambulatory Visit: Payer: Self-pay

## 2023-03-24 ENCOUNTER — Ambulatory Visit: Payer: 59 | Admitting: Physician Assistant

## 2023-03-24 MED ORDER — VALACYCLOVIR HCL 500 MG PO TABS
500.0000 mg | ORAL_TABLET | Freq: Two times a day (BID) | ORAL | 0 refills | Status: DC
  Filled 2023-03-24: qty 30, 15d supply, fill #0

## 2023-03-26 ENCOUNTER — Other Ambulatory Visit: Payer: Self-pay | Admitting: Internal Medicine

## 2023-03-27 ENCOUNTER — Other Ambulatory Visit: Payer: Self-pay

## 2023-04-01 ENCOUNTER — Other Ambulatory Visit: Payer: Self-pay | Admitting: Internal Medicine

## 2023-04-02 ENCOUNTER — Other Ambulatory Visit: Payer: Self-pay

## 2023-04-02 ENCOUNTER — Other Ambulatory Visit: Payer: Self-pay | Admitting: Internal Medicine

## 2023-04-04 ENCOUNTER — Other Ambulatory Visit: Payer: Self-pay | Admitting: Nurse Practitioner

## 2023-04-04 ENCOUNTER — Telehealth: Payer: Commercial Managed Care - PPO | Admitting: Nurse Practitioner

## 2023-04-04 DIAGNOSIS — R399 Unspecified symptoms and signs involving the genitourinary system: Secondary | ICD-10-CM

## 2023-04-04 MED ORDER — FLUCONAZOLE 150 MG PO TABS: ORAL_TABLET | ORAL | 0 refills | Status: DC

## 2023-04-04 MED ORDER — CEPHALEXIN 500 MG PO CAPS: 500.0000 mg | ORAL_CAPSULE | Freq: Two times a day (BID) | ORAL | 0 refills | Status: AC

## 2023-04-04 NOTE — Progress Notes (Signed)

## 2023-04-04 NOTE — Progress Notes (Signed)
I have spent 5 minutes in review of e-visit questionnaire, review and updating patient chart, medical decision making and response to patient.   Claiborne Rigg, NP

## 2023-04-08 ENCOUNTER — Other Ambulatory Visit: Payer: Self-pay | Admitting: Internal Medicine

## 2023-04-10 ENCOUNTER — Encounter: Payer: Self-pay | Admitting: Obstetrics and Gynecology

## 2023-04-10 ENCOUNTER — Other Ambulatory Visit: Payer: Self-pay | Admitting: Internal Medicine

## 2023-04-12 ENCOUNTER — Other Ambulatory Visit: Payer: Self-pay | Admitting: Internal Medicine

## 2023-04-12 ENCOUNTER — Other Ambulatory Visit: Payer: Self-pay | Admitting: Nurse Practitioner

## 2023-04-13 ENCOUNTER — Other Ambulatory Visit: Payer: Self-pay

## 2023-04-14 ENCOUNTER — Other Ambulatory Visit (HOSPITAL_COMMUNITY): Payer: Self-pay

## 2023-04-14 ENCOUNTER — Other Ambulatory Visit: Payer: Self-pay | Admitting: Internal Medicine

## 2023-04-14 ENCOUNTER — Other Ambulatory Visit: Payer: Self-pay

## 2023-04-14 MED ORDER — CLONAZEPAM 0.5 MG PO TABS
0.5000 mg | ORAL_TABLET | Freq: Two times a day (BID) | ORAL | 1 refills | Status: DC | PRN
  Filled 2023-04-14 – 2023-05-16 (×2): qty 60, 30d supply, fill #0
  Filled 2023-06-25: qty 60, 30d supply, fill #1

## 2023-04-17 ENCOUNTER — Other Ambulatory Visit: Payer: Self-pay | Admitting: Internal Medicine

## 2023-04-21 ENCOUNTER — Ambulatory Visit: Payer: Commercial Managed Care - PPO | Admitting: Physician Assistant

## 2023-04-21 ENCOUNTER — Other Ambulatory Visit: Payer: Self-pay | Admitting: Internal Medicine

## 2023-04-21 ENCOUNTER — Encounter: Payer: Self-pay | Admitting: Nurse Practitioner

## 2023-04-21 ENCOUNTER — Other Ambulatory Visit: Payer: Self-pay | Admitting: Nurse Practitioner

## 2023-04-21 VITALS — BP 120/82 | HR 83

## 2023-04-21 DIAGNOSIS — Z09 Encounter for follow-up examination after completed treatment for conditions other than malignant neoplasm: Secondary | ICD-10-CM

## 2023-04-21 DIAGNOSIS — Z87440 Personal history of urinary (tract) infections: Secondary | ICD-10-CM

## 2023-04-21 DIAGNOSIS — N390 Urinary tract infection, site not specified: Secondary | ICD-10-CM | POA: Diagnosis not present

## 2023-04-21 LAB — URINALYSIS, COMPLETE
Bilirubin, UA: NEGATIVE
Glucose, UA: NEGATIVE
Ketones, UA: NEGATIVE
Leukocytes,UA: NEGATIVE
Nitrite, UA: NEGATIVE
Protein,UA: NEGATIVE
Specific Gravity, UA: 1.025 (ref 1.005–1.030)
Urobilinogen, Ur: 0.2 mg/dL (ref 0.2–1.0)
pH, UA: 5.5 (ref 5.0–7.5)

## 2023-04-21 LAB — MICROSCOPIC EXAMINATION

## 2023-04-21 MED ORDER — TRIMETHOPRIM 100 MG PO TABS: 100.0000 mg | ORAL_TABLET | Freq: Every day | ORAL | 5 refills | Status: DC

## 2023-04-21 MED ORDER — LEVOTHYROXINE SODIUM 50 MCG PO TABS
50.0000 ug | ORAL_TABLET | Freq: Every day | ORAL | 0 refills | Status: DC
Start: 2023-04-21 — End: 2023-04-29
  Filled 2023-04-21: qty 30, 30d supply, fill #0

## 2023-04-21 NOTE — Patient Instructions (Signed)
Recurrent UTI Prevention Strategies  Stay well hydrated. Take an over-the-counter cranberry supplement for urinary tract health. Take this once or twice daily on an empty stomach, e.g. right before bed. Take an over-the-counter d-mannose supplement. Take this daily per packaging instructions. Take an over-the-counter probiotic containing the bacterial species called Lactobacillus. Take this daily. Use vaginal estrogen cream. Apply a pea-sized amount around the opening of the urethra every day for 2 weeks, then three times weekly forever.

## 2023-04-21 NOTE — Progress Notes (Signed)
04/21/2023 5:16 PM   Kristi Carter 11/17/1960 [id number]  CC: Chief Complaint  Patient presents with   Follow-up   HPI: Kristi Carter is a 63 y.o. female with PMH recurrent UTI with microscopic hematuria, possibly related, who presents today for follow-up.   She established with Dr. Apolinar Junes on 02/04/2023 and was started on a UTI regimen with estrogen cream at that time.  Today she reports she had symptom recurrence a couple of weeks ago and did a virtual visit, completing antibiotics about 9 days ago.  She is asymptomatic today.  She has been using topical vaginal estrogen cream about 3 times weekly, and is on lactobacillus containing probiotics, and cranberry supplements.  She is frustrated with her frequency of infections.  In-office UA today positive for trace intact blood; urine microscopy pan negative.  PMH: Past Medical History:  Diagnosis Date   A-fib [facility])    Allergy    Antibiotic-induced yeast infection 07/20/2019   Anxiety    GERD (gastroesophageal reflux disease)    Hypothyroid     Surgical History: Past Surgical History:  Procedure Laterality Date   AUGMENTATION MAMMAPLASTY Bilateral 1990   saline   BIOPSY  02/11/2023   Procedure: BIOPSY;  Surgeon: Wyline Mood, MD;  Location: [facility] ENDOSCOPY;  Service: Gastroenterology;;   CARDIAC ELECTROPHYSIOLOGY MAPPING AND ABLATION     COLONOSCOPY WITH PROPOFOL N/A 04/13/2020   Procedure: COLONOSCOPY WITH PROPOFOL;  Surgeon: Wyline Mood, MD;  Location: [facility] ENDOSCOPY;  Service: Gastroenterology;  Laterality: N/A;   ESOPHAGOGASTRODUODENOSCOPY (EGD) WITH PROPOFOL N/A 04/13/2020   Procedure: ESOPHAGOGASTRODUODENOSCOPY (EGD) WITH PROPOFOL;  Surgeon: Wyline Mood, MD;  Location: [facility] ENDOSCOPY;  Service: Gastroenterology;  Laterality: N/A;   ESOPHAGOGASTRODUODENOSCOPY (EGD) WITH PROPOFOL N/A 07/16/2020   Procedure: ESOPHAGOGASTRODUODENOSCOPY (EGD) WITH PROPOFOL;  Surgeon: Wyline Mood, MD;  Location: [facility] ENDOSCOPY;  Service:  Gastroenterology;  Laterality: N/A;   ESOPHAGOGASTRODUODENOSCOPY (EGD) WITH PROPOFOL N/A 02/11/2023   Procedure: ESOPHAGOGASTRODUODENOSCOPY (EGD) WITH PROPOFOL;  Surgeon: Wyline Mood, MD;  Location: [facility] ENDOSCOPY;  Service: Gastroenterology;  Laterality: N/A;    Home Medications:  Allergies as of 04/21/2023       Reactions   Other    Cats, Mold and Feathers   Prednisone    Other reaction(s): Vomiting   Tape         Medication List        Accurate as of April 21, 2023  5:16 PM. If you have any questions, ask your nurse or doctor.          atorvastatin 40 MG tablet Commonly known as: LIPITOR Take 1 tablet (40 mg total) by mouth daily.   clonazePAM 0.5 MG tablet Commonly known as: KLONOPIN Take 1 tablet (0.5 mg total) by mouth 2 (two) times daily as needed for anxiety.   esomeprazole 40 MG capsule Commonly known as: NEXIUM Take 1 capsule (40 mg total) by mouth daily.   estradiol 0.1 MG/GM vaginal cream Commonly known as: ESTRACE Apply pea sized amount to tip of finger to urethra before bed. Wash hands well after application. Use Monday, Wednesday and Friday. Discard applicator.   fluconazole 150 MG tablet Commonly known as: Diflucan Take 1 tablet (150 mg total) by mouth daily.   fluconazole 150 MG tablet Commonly known as: Diflucan Take one tablet by mouth as a single dose. May repeat in 3 days if symptoms persist.   ibuprofen 800 MG tablet Commonly known as: ADVIL Take 1 tablet (800 mg total) by mouth daily as needed.  levothyroxine 50 MCG tablet Commonly known as: SYNTHROID Take 1 tablet (50 mcg total) by mouth every morning.   trimethoprim 100 MG tablet Commonly known as: TRIMPEX Take 1 tablet (100 mg total) by mouth daily. Started by: Carman Ching   valACYclovir 500 MG tablet Commonly known as: VALTREX Take 1 tablet (500 mg total) by mouth 2 (two) times daily for 3 days as needed        Allergies:  Allergies  Allergen Reactions    Other     Cats, Mold and Feathers   Prednisone     Other reaction(s): Vomiting   Tape     Family History: Family History  Problem Relation Age of Onset   Healthy Father    Cancer Paternal Uncle        started with prostate and spread to entire body   Stroke Paternal Grandmother    Heart attack Paternal Grandfather     Social History:   reports that she has never smoked. She has never used smokeless tobacco. She reports current alcohol use of about 10.0 standard drinks of alcohol per week. She reports that she does not use drugs.  Physical Exam: BP 120/82   Pulse 83   Constitutional:  Alert and oriented, no acute distress, nontoxic appearing HEENT: [place], AT Cardiovascular: No clubbing, cyanosis, or edema Respiratory: Normal respiratory effort, no increased work of breathing Skin: No rashes, bruises or suspicious lesions Neurologic: Grossly intact, no focal deficits, moving all 4 extremities Psychiatric: Normal mood and affect  Laboratory Data: Results for orders placed or performed in visit on 04/21/23  Microscopic Examination   Collection Time: 04/21/23  3:18 PM   Urine  Result Value Ref Range   WBC, UA 0-5 0 - 5 /hpf   RBC, Urine 0-2 0 - 2 /hpf   Epithelial Cells (non renal) 0-10 0 - 10 /hpf   Mucus, UA Present (A) Not Estab.   Bacteria, UA Few None seen/Few  Urinalysis, Complete   Collection Time: 04/21/23  3:18 PM  Result Value Ref Range   Specific Gravity, UA 1.025 1.005 - 1.030   pH, UA 5.5 5.0 - 7.5   Color, UA Yellow Yellow   Appearance Ur Clear Clear   Leukocytes,UA Negative Negative   Protein,UA Negative Negative/Trace   Glucose, UA Negative Negative   Ketones, UA Negative Negative   RBC, UA Trace (A) Negative   Bilirubin, UA Negative Negative   Urobilinogen, Ur 0.2 0.2 - 1.0 mg/dL   Nitrite, UA Negative Negative   Microscopic Examination See below:    Assessment & Plan:   1. Recurrent UTI (Primary) She continues to have symptomatic episodes,  however with virtual visits there is no labs available for me to review.  Her UA today is bland and she is asymptomatic at this time.  We had a lengthy conversation about recurrent UTIs and I encouraged her to continue estrogen cream, probiotics, and cranberry supplements.  We discussed that even with a diligent regimen, it can take 3 months or so to see changes.  I offered her daily suppressive antibiotics and she agreed.  We agreed to stay on these for 6 months to down regulate her bladder and I will see her back in clinic, at which time we will stop the antibiotics and see how she does off them.  We discussed that she should stay on her UTI prevention regimen in the interim.  She is in agreement with this plan.  We also discussed that if she  has breakthrough symptoms or gross hematuria on trimethoprim, I want to see her back in clinic.  Will defer cystoscopy for now since she does not have micro heme today. - Urinalysis, Complete - trimethoprim (TRIMPEX) 100 MG tablet; Take 1 tablet (100 mg total) by mouth daily.  Dispense: 30 tablet; Refill: 5  Return in about 6 months (around 10/19/2023) for rUTI follow-up with UA.  Carman Ching, PA-C  [facility] [place] [address] [place], [place] [address] [phone]

## 2023-04-22 ENCOUNTER — Other Ambulatory Visit (HOSPITAL_COMMUNITY): Payer: Self-pay

## 2023-04-22 ENCOUNTER — Other Ambulatory Visit: Payer: Self-pay

## 2023-04-28 ENCOUNTER — Other Ambulatory Visit: Payer: Commercial Managed Care - PPO

## 2023-04-28 DIAGNOSIS — E039 Hypothyroidism, unspecified: Secondary | ICD-10-CM | POA: Diagnosis not present

## 2023-04-29 ENCOUNTER — Telehealth: Payer: Self-pay | Admitting: Internal Medicine

## 2023-04-29 ENCOUNTER — Other Ambulatory Visit (HOSPITAL_BASED_OUTPATIENT_CLINIC_OR_DEPARTMENT_OTHER): Payer: Self-pay

## 2023-04-29 ENCOUNTER — Ambulatory Visit (INDEPENDENT_AMBULATORY_CARE_PROVIDER_SITE_OTHER): Payer: Commercial Managed Care - PPO | Admitting: Internal Medicine

## 2023-04-29 ENCOUNTER — Encounter: Payer: Self-pay | Admitting: Internal Medicine

## 2023-04-29 ENCOUNTER — Other Ambulatory Visit (HOSPITAL_COMMUNITY): Payer: Self-pay

## 2023-04-29 VITALS — Ht 63.0 in

## 2023-04-29 DIAGNOSIS — E039 Hypothyroidism, unspecified: Secondary | ICD-10-CM

## 2023-04-29 LAB — TSH: TSH: 1.74 m[IU]/L (ref 0.40–4.50)

## 2023-04-29 LAB — T4, FREE: Free T4: 1.6 ng/dL (ref 0.8–1.8)

## 2023-04-29 MED ORDER — LEVOTHYROXINE SODIUM 50 MCG PO TABS
50.0000 ug | ORAL_TABLET | Freq: Every day | ORAL | 3 refills | Status: AC
  Filled 2023-04-29 – 2023-05-16 (×4): qty 90, 90d supply, fill #0
  Filled 2023-05-30 – 2023-07-29 (×3): qty 90, 90d supply, fill #1
  Filled 2023-11-09: qty 90, 90d supply, fill #2
  Filled 2023-12-10 – 2024-02-07 (×3): qty 90, 90d supply, fill #3

## 2023-04-29 NOTE — Progress Notes (Addendum)
Virtual Visit via Video Note  I connected with Kristi Carter on 04/29/23 at 1 pm by a video enabled telemedicine application and verified that I am speaking with the correct person using two identifiers.   I discussed the limitations of evaluation and management by telemedicine and the availability of in person appointments. The patient expressed understanding and agreed to proceed.   -Location of the patient : home -Location of the provider : Home -The names of all persons participating in the telemedicine service : Pt and myself        Name: Kristi Carter  MRN/ DOB: [id number], Nov 10, 1960    Age/ Sex: 63 y.o., female    PCP: Kara Dies, NP   Reason for Endocrinology Evaluation: Hypothyroidism     Date of Initial Endocrinology Evaluation: 02/04/2022    HPI: Kristi Carter is a 63 y.o. female with a past medical history of Dyslipidemia, Barrett's esophagus , Hx of A. Fib S/P cardioversion and Hypothyroidism. The patient presented for initial endocrinology clinic visit on 02/04/2022 for consultative assistance with her Hypothyroidism.   She has been noted with hypothyroidism ~ 10 yrs ago  No prior hx of   She had a thyroid nodule while living in [place] , no prior FNA . Ultrasound in 2020 did NOT show any nodules.    No family history of thyroid disease  SUBJECTIVE:    Today (04/29/23:  Kristi Carter is here for a follow up on Hypothyroidism.   Has GERD , follows with GI . S/P EGD in 02/2023 for hx of Barrett's esophagus. Heartburn symptoms controlled with anatacid  Pt with recurrent UTI's and follows with urology , on small dose of preventative Abx  Denies local neck swelling  She does have rare palpitations but these are transient in nature No changes in bowel movements, historically she has had IBS No Biotin  She started taking multivitamin with levothyroxine  Levothyroxine 50 mcg daily   HISTORY:  Past Medical History:  Past Medical  History:  Diagnosis Date   A-fib (HCC)    Allergy    Antibiotic-induced yeast infection 07/20/2019   Anxiety    GERD (gastroesophageal reflux disease)    Hypothyroid    Past Surgical History:  Past Surgical History:  Procedure Laterality Date   AUGMENTATION MAMMAPLASTY Bilateral 1990   saline   BIOPSY  02/11/2023   Procedure: BIOPSY;  Surgeon: Wyline Mood, MD;  Location: [facility] ENDOSCOPY;  Service: Gastroenterology;;   CARDIAC ELECTROPHYSIOLOGY MAPPING AND ABLATION     COLONOSCOPY WITH PROPOFOL N/A 04/13/2020   Procedure: COLONOSCOPY WITH PROPOFOL;  Surgeon: Wyline Mood, MD;  Location: [facility] ENDOSCOPY;  Service: Gastroenterology;  Laterality: N/A;   ESOPHAGOGASTRODUODENOSCOPY (EGD) WITH PROPOFOL N/A 04/13/2020   Procedure: ESOPHAGOGASTRODUODENOSCOPY (EGD) WITH PROPOFOL;  Surgeon: Wyline Mood, MD;  Location: [facility] ENDOSCOPY;  Service: Gastroenterology;  Laterality: N/A;   ESOPHAGOGASTRODUODENOSCOPY (EGD) WITH PROPOFOL N/A 07/16/2020   Procedure: ESOPHAGOGASTRODUODENOSCOPY (EGD) WITH PROPOFOL;  Surgeon: Wyline Mood, MD;  Location: [facility] ENDOSCOPY;  Service: Gastroenterology;  Laterality: N/A;   ESOPHAGOGASTRODUODENOSCOPY (EGD) WITH PROPOFOL N/A 02/11/2023   Procedure: ESOPHAGOGASTRODUODENOSCOPY (EGD) WITH PROPOFOL;  Surgeon: Wyline Mood, MD;  Location: [facility] ENDOSCOPY;  Service: Gastroenterology;  Laterality: N/A;    Social History:  reports that she has never smoked. She has never used smokeless tobacco. She reports current alcohol use of about 10.0 standard drinks of alcohol per week. She reports that she does not use drugs. Family History: family history includes Cancer in her paternal uncle;  Healthy in her father; Heart attack in her paternal grandfather; Stroke in her paternal grandmother.   HOME MEDICATIONS: Allergies as of 04/29/2023       Reactions   Other    Cats, Mold and Feathers   Prednisone    Other reaction(s): Vomiting   Tape         Medication List        Accurate  as of April 29, 2023  7:20 AM. If you have any questions, ask your nurse or doctor.          atorvastatin 40 MG tablet Commonly known as: LIPITOR Take 1 tablet (40 mg total) by mouth daily.   clonazePAM 0.5 MG tablet Commonly known as: KLONOPIN Take 1 tablet (0.5 mg total) by mouth 2 (two) times daily as needed for anxiety.   esomeprazole 40 MG capsule Commonly known as: NEXIUM Take 1 capsule (40 mg total) by mouth daily.   estradiol 0.1 MG/GM vaginal cream Commonly known as: ESTRACE Apply pea sized amount to tip of finger to urethra before bed. Wash hands well after application. Use Monday, Wednesday and Friday. Discard applicator.   fluconazole 150 MG tablet Commonly known as: Diflucan Take 1 tablet (150 mg total) by mouth daily.   fluconazole 150 MG tablet Commonly known as: Diflucan Take one tablet by mouth as a single dose. May repeat in 3 days if symptoms persist.   ibuprofen 800 MG tablet Commonly known as: ADVIL Take 1 tablet (800 mg total) by mouth daily as needed.   levothyroxine 50 MCG tablet Commonly known as: SYNTHROID Take 1 tablet (50 mcg total) by mouth every morning.   levothyroxine 50 MCG tablet Commonly known as: SYNTHROID Take 1 tablet (50 mcg total) by mouth daily before breakfast.   trimethoprim 100 MG tablet Commonly known as: TRIMPEX Take 1 tablet (100 mg total) by mouth daily.   valACYclovir 500 MG tablet Commonly known as: VALTREX Take 1 tablet (500 mg total) by mouth 2 (two) times daily for 3 days as needed          REVIEW OF SYSTEMS: A comprehensive Carter was conducted with the patient and is negative except as per HPI    OBJECTIVE:  VS: There were no vitals taken for this visit.   Wt Readings from Last 3 Encounters:  03/05/23 154 lb (69.9 kg)  02/11/23 157 lb (71.2 kg)  02/04/23 157 lb 8 oz (71.4 kg)     EXAM: General: Pt appears well and is in NAD  Mental Status: Judgment, insight: Intact Orientation: Oriented to  time, place, and person Mood and affect: No depression, anxiety, or agitation     DATA REVIEWED:   Latest Reference Range & Units 04/28/23 13:17  TSH 0.40 - 4.50 mIU/L 1.74  T4,Free(Direct) 0.8 - 1.8 ng/dL 1.6    Thyroid Ultrasound 03/19/2018    Parenchymal Echotexture: Mildly heterogenous   Isthmus: 0.4 cm   Right lobe: 3.7 x 0.8 x 1.3 cm   Left lobe: 3.3 x 0.6 x 1.0 cm   _________________________________________________________   Estimated total number of nodules >/= 1 cm: 0   Number of spongiform nodules >/=  2 cm not described below (TR1): 0   Number of mixed cystic and solid nodules >/= 1.5 cm not described below (TR2): 0   _________________________________________________________   No discrete nodules are seen within the thyroid gland.   IMPRESSION: There is no evidence of thyroid nodule. The gland is within normal limits in size.  ASSESSMENT/PLAN/RECOMMENDATIONS:   Hypothyroidism:  - Pt is clinically euthyroid  -Patient advised to separate vitamins by 4 hours from levothyroxine -TFTs normal, no change   Medications : Continue Levothyroxine 50 mcg daily      Follow-up in 1 yr  Signed electronically by: Lyndle Herrlich, MD  [facility] Endocrinology  [facility] [address] [place], [place] [address] Phone: [phone] FAX: [phone]   CC: Kara Dies, NP [address] [place] [address] Phone: [phone] Fax: [phone]   Return to Endocrinology clinic as below: Future Appointments  Date Time Provider Department Center  04/29/2023  1:00 PM Kingston Guiles, Konrad Dolores, MD [facility] None  05/28/2023  9:45 AM Deirdre Evener, MD [facility] None  10/20/2023  3:00 PM Carman Ching, PA-C [facility] None  10/30/2023  1:00 PM Kara Dies, NP [facility]

## 2023-04-29 NOTE — Telephone Encounter (Signed)
Please contact the patient and schedule for follow-up with me in 1 year    Thanks

## 2023-05-03 ENCOUNTER — Other Ambulatory Visit: Payer: Self-pay | Admitting: Family

## 2023-05-04 ENCOUNTER — Other Ambulatory Visit: Payer: Self-pay

## 2023-05-04 ENCOUNTER — Other Ambulatory Visit (HOSPITAL_COMMUNITY): Payer: Self-pay

## 2023-05-04 NOTE — Telephone Encounter (Signed)
 Patient scheduled.

## 2023-05-05 ENCOUNTER — Other Ambulatory Visit (HOSPITAL_BASED_OUTPATIENT_CLINIC_OR_DEPARTMENT_OTHER): Payer: Self-pay

## 2023-05-05 ENCOUNTER — Other Ambulatory Visit (HOSPITAL_COMMUNITY): Payer: Self-pay

## 2023-05-05 ENCOUNTER — Other Ambulatory Visit: Payer: Self-pay

## 2023-05-08 ENCOUNTER — Other Ambulatory Visit (HOSPITAL_COMMUNITY): Payer: Self-pay

## 2023-05-08 ENCOUNTER — Encounter: Payer: Self-pay | Admitting: Internal Medicine

## 2023-05-08 ENCOUNTER — Other Ambulatory Visit: Payer: Self-pay

## 2023-05-11 ENCOUNTER — Other Ambulatory Visit (HOSPITAL_COMMUNITY): Payer: Self-pay

## 2023-05-16 ENCOUNTER — Other Ambulatory Visit (HOSPITAL_COMMUNITY): Payer: Self-pay

## 2023-05-16 ENCOUNTER — Other Ambulatory Visit (HOSPITAL_BASED_OUTPATIENT_CLINIC_OR_DEPARTMENT_OTHER): Payer: Self-pay

## 2023-05-18 ENCOUNTER — Other Ambulatory Visit: Payer: Self-pay

## 2023-05-20 ENCOUNTER — Other Ambulatory Visit (HOSPITAL_COMMUNITY): Payer: Self-pay

## 2023-05-28 ENCOUNTER — Ambulatory Visit: Payer: 59 | Admitting: Dermatology

## 2023-05-30 ENCOUNTER — Encounter: Payer: Self-pay | Admitting: Nurse Practitioner

## 2023-05-30 ENCOUNTER — Other Ambulatory Visit (HOSPITAL_COMMUNITY): Payer: Self-pay

## 2023-05-31 ENCOUNTER — Telehealth

## 2023-06-01 ENCOUNTER — Other Ambulatory Visit: Payer: Self-pay

## 2023-06-10 ENCOUNTER — Ambulatory Visit: Admitting: Podiatry

## 2023-06-10 ENCOUNTER — Encounter: Payer: Self-pay | Admitting: Nurse Practitioner

## 2023-06-10 ENCOUNTER — Encounter: Payer: Self-pay | Admitting: Podiatry

## 2023-06-10 ENCOUNTER — Telehealth: Payer: Self-pay

## 2023-06-10 DIAGNOSIS — R600 Localized edema: Secondary | ICD-10-CM | POA: Diagnosis not present

## 2023-06-10 NOTE — Telephone Encounter (Signed)
 Please call pt to schedule appointment for evaluation.

## 2023-06-10 NOTE — Progress Notes (Signed)
  Subjective:  Patient ID: Kristi Carter, female    DOB: 02/17/1961,   MRN: [id number]  No chief complaint on file.   63 y.o. female presents for concern of swelling in bilateral ankles that has been ongoing for a while now. 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 Relates her feet are always down and her ankles get very swollen denies any pain denies any treatments . Does relate she has reduced her salt intake.  Denies any other pedal complaints. Denies n/v/f/c.   Past Medical History:  Diagnosis Date   A-fib [facility])    Allergy    Antibiotic-induced yeast infection 07/20/2019   Anxiety    GERD (gastroesophageal reflux disease)    Hypothyroid     Objective:  Physical Exam: Vascular: DP/PT pulses 2/4 bilateral. CFT <3 seconds. Normal hair growth on digits. Non pitting edema noted to bilateral lower extremities mostly around posterior ankle.  Skin. No lacerations or abrasions bilateral feet.  Musculoskeletal: MMT 5/5 bilateral lower extremities in DF, PF, Inversion and Eversion. Deceased ROM in DF of ankle joint.  Neurological: Sensation intact to light touch.   Assessment:   1. Localized edema      Plan:  Patient was evaluated and treated and all questions answered. Discussed edema in legs and treatment options with patient.  Discussed compression stockings and elevation.  Discussed possible referral to vein specialist but defers.  Will ask PCP about lasix as well.  Return as needed.   Louann Sjogren, DPM

## 2023-06-10 NOTE — Telephone Encounter (Signed)
 Copied from CRM [id number]. Topic: Clinical - Medication Question >> Jun 10, 2023  3:56 PM Kristi Carter wrote: Reason for CRM: Pt states she was seen at [facility] today for swelling in her ankles and was advised to contact her pcp so they she can be prescribed a low dose fluid pill for this

## 2023-06-10 NOTE — Patient Instructions (Signed)
conce

## 2023-06-11 NOTE — Telephone Encounter (Signed)
 Called Patient and she politely said she has discussed with Kara Dies before and nothing was done. Patient also states she went to [facility] yesterday which said lay off the salt, wear compression socks and see your PCP. Patient states she does not have PAL to keep taking off for one provider to send her to the other provider and to keep seeing providers.

## 2023-06-11 NOTE — Telephone Encounter (Signed)
 LMTCB

## 2023-06-11 NOTE — Telephone Encounter (Signed)
 Left message for Patient to call back and schedule an appointment with Kara Dies for soon as possible. Okay to schedule Patient with Kara Dies.

## 2023-06-16 ENCOUNTER — Ambulatory Visit: Admitting: Nurse Practitioner

## 2023-06-25 ENCOUNTER — Other Ambulatory Visit (HOSPITAL_COMMUNITY): Payer: Self-pay

## 2023-06-25 ENCOUNTER — Other Ambulatory Visit: Payer: Self-pay

## 2023-06-29 ENCOUNTER — Other Ambulatory Visit: Payer: Self-pay

## 2023-06-29 ENCOUNTER — Other Ambulatory Visit (HOSPITAL_COMMUNITY): Payer: Self-pay

## 2023-07-02 ENCOUNTER — Other Ambulatory Visit (HOSPITAL_COMMUNITY): Payer: Self-pay

## 2023-07-05 ENCOUNTER — Other Ambulatory Visit: Payer: Self-pay | Admitting: Nurse Practitioner

## 2023-07-06 ENCOUNTER — Other Ambulatory Visit: Payer: Self-pay | Admitting: Nurse Practitioner

## 2023-07-06 ENCOUNTER — Other Ambulatory Visit: Payer: Self-pay

## 2023-07-06 DIAGNOSIS — E785 Hyperlipidemia, unspecified: Secondary | ICD-10-CM

## 2023-07-06 MED ORDER — ATORVASTATIN CALCIUM 40 MG PO TABS
40.0000 mg | ORAL_TABLET | Freq: Every day | ORAL | 0 refills | Status: DC
  Filled 2023-07-06: qty 90, 90d supply, fill #0

## 2023-07-06 MED ORDER — ESOMEPRAZOLE MAGNESIUM 40 MG PO CPDR
40.0000 mg | DELAYED_RELEASE_CAPSULE | Freq: Every day | ORAL | 1 refills | Status: DC
  Filled 2023-07-06: qty 90, 90d supply, fill #0
  Filled 2023-07-29 – 2023-10-05 (×2): qty 90, 90d supply, fill #1

## 2023-07-26 ENCOUNTER — Other Ambulatory Visit: Payer: Self-pay | Admitting: Nurse Practitioner

## 2023-07-29 ENCOUNTER — Other Ambulatory Visit: Payer: Self-pay | Admitting: Obstetrics and Gynecology

## 2023-07-29 ENCOUNTER — Other Ambulatory Visit: Payer: Self-pay | Admitting: Nurse Practitioner

## 2023-07-29 ENCOUNTER — Other Ambulatory Visit (HOSPITAL_COMMUNITY): Payer: Self-pay

## 2023-07-29 ENCOUNTER — Other Ambulatory Visit: Payer: Self-pay

## 2023-07-29 DIAGNOSIS — E785 Hyperlipidemia, unspecified: Secondary | ICD-10-CM

## 2023-07-29 MED ORDER — CLONAZEPAM 0.5 MG PO TABS
0.5000 mg | ORAL_TABLET | Freq: Two times a day (BID) | ORAL | 1 refills | Status: DC | PRN
  Filled 2023-07-29: qty 60, 30d supply, fill #0
  Filled 2023-09-30: qty 60, 30d supply, fill #1
  Filled 2023-10-07: qty 60, 30d supply, fill #0

## 2023-07-29 MED ORDER — ATORVASTATIN CALCIUM 40 MG PO TABS
40.0000 mg | ORAL_TABLET | Freq: Every day | ORAL | 0 refills | Status: DC
  Filled 2023-07-29 – 2023-10-05 (×2): qty 90, 90d supply, fill #0

## 2023-07-29 MED FILL — Valacyclovir HCl Tab 500 MG: ORAL | 15 days supply | Qty: 30 | Fill #0 | Status: AC

## 2023-07-29 NOTE — Telephone Encounter (Signed)
Controlled substance database reviewed.  Medication sent to pharmacy.

## 2023-09-03 ENCOUNTER — Other Ambulatory Visit (HOSPITAL_COMMUNITY): Payer: Self-pay

## 2023-09-23 ENCOUNTER — Other Ambulatory Visit (HOSPITAL_COMMUNITY): Payer: Self-pay

## 2023-09-30 ENCOUNTER — Other Ambulatory Visit (HOSPITAL_COMMUNITY): Payer: Self-pay

## 2023-09-30 ENCOUNTER — Other Ambulatory Visit: Payer: Self-pay

## 2023-10-02 ENCOUNTER — Other Ambulatory Visit (HOSPITAL_COMMUNITY): Payer: Self-pay

## 2023-10-05 ENCOUNTER — Other Ambulatory Visit (HOSPITAL_COMMUNITY): Payer: Self-pay

## 2023-10-05 ENCOUNTER — Other Ambulatory Visit: Payer: Self-pay

## 2023-10-07 ENCOUNTER — Other Ambulatory Visit: Payer: Self-pay

## 2023-10-07 ENCOUNTER — Other Ambulatory Visit (HOSPITAL_COMMUNITY): Payer: Self-pay

## 2023-10-13 ENCOUNTER — Telehealth: Payer: Self-pay

## 2023-10-13 NOTE — Telephone Encounter (Signed)
 Patient states she would like to have her labs drawn at [facility] before her visit with Chelsea Aurora, NP, on 11/03/2023 for her physical.  I let patient know that I will send a message to Chelsea Aurora, NP, and ask them to please call her to let her know if it's ok, and, if so, when the orders will be ready.

## 2023-10-14 ENCOUNTER — Other Ambulatory Visit: Payer: Self-pay | Admitting: Nurse Practitioner

## 2023-10-14 DIAGNOSIS — Z0000 Encounter for general adult medical examination without abnormal findings: Secondary | ICD-10-CM

## 2023-10-14 NOTE — Telephone Encounter (Signed)
 Labs has been ordered. She can get the labs 2-3 days before her appointment at [facility].

## 2023-10-14 NOTE — Telephone Encounter (Signed)
 Patient is aware that labs have been ordered to [facility] and she should get the labs 2-3 days before her visit with Chelsea Aurora.

## 2023-10-15 ENCOUNTER — Other Ambulatory Visit: Payer: Self-pay

## 2023-10-15 ENCOUNTER — Ambulatory Visit: Payer: Self-pay | Admitting: *Deleted

## 2023-10-15 ENCOUNTER — Encounter: Payer: Self-pay | Admitting: Nurse Practitioner

## 2023-10-15 ENCOUNTER — Other Ambulatory Visit

## 2023-10-15 DIAGNOSIS — N390 Urinary tract infection, site not specified: Secondary | ICD-10-CM

## 2023-10-15 DIAGNOSIS — R300 Dysuria: Secondary | ICD-10-CM

## 2023-10-15 NOTE — Telephone Encounter (Signed)
 Please call pt to schedule an OV for tomorrow and she can drop of urine sample today. If pt is agreeable.

## 2023-10-15 NOTE — Telephone Encounter (Addendum)
 FYI Only or Action Required?: Action required by provider: patient requesting lab order.  Patient was last seen in primary care on 03/05/2023 by Vincente Saber, NP.  Called Nurse Triage reporting Urinary Retention (Urinary pressure).  Symptoms began several days ago.  Interventions attempted: Prescription medications: Trimpex .  Symptoms are: gradually worsening.  Triage Disposition: See Physician Within 24 Hours  Patient/caregiver understands and will follow disposition?: Patient requesting lab order- see note Patient at work- may leave message on VM if patient unable to answer call back   Reason for Disposition  Urinating more frequently than usual (i.e., frequency) OR new-onset of the feeling of an urgent need to urinate (i.e., urgency)  Answer Assessment - Initial Assessment Questions Patient state she is having urinary pressure that has increased over last 2-3 days- OTC urinary infection test- +. Patient states this is frequent occurrence for her and PCP has advised her to come to lab for culture/testing when this happens. Patient would like to get lab order- she works for [employer] and does not want point occurrence. Please call her back if she is able to do this without appointment - she does have upcoming appointment with PCP scheduled   1. SYMPTOM: What's the main symptom you're concerned about? (e.g., frequency, incontinence)     Pressure 2. ONSET: When did the  urinary pressure  start?     Last night- 3 days 3. PAIN: Is there any pain? If Yes, ask: How bad is it? (Scale: 1-10; mild, moderate, severe)     no 4. CAUSE: What do you think is causing the symptoms?     UTI- home test + 5. OTHER SYMPTOMS: Do you have any other symptoms? (e.g., blood in urine, fever, flank pain, pain with urination)     No other symptoms  Protocols used: Urinary Symptoms-A-AH  Copied from CRM #[id number]. Topic: Clinical - Red Word Triage >> Oct 15, 2023 10:10 AM Jayma L wrote: Red Word  that prompted transfer to Nurse Triage: patient took a at home uti test and came back positive, said she's having a hard time urinating and she's drinking a lot of water. Started last night and she has some pressure as well. No burning though

## 2023-10-15 NOTE — Telephone Encounter (Signed)
 Left message that I scheduled her  for 4:00 tomorrow and if she is available to drop a urine off today to call the office at [phone] to get on the lab schedule. I also left message saying if she is unavailable to drop a urine today then we can do everything on 10/16/23 at 4:00.

## 2023-10-15 NOTE — Telephone Encounter (Signed)
 Orders are placed and Patient is on lab schedule for today and video visit with Charanpreet Kaur on 10/16/23.

## 2023-10-15 NOTE — Addendum Note (Signed)
 Addended by: MARYLEN PRO A on: 10/15/2023 03:14 PM   Modules accepted: Orders

## 2023-10-15 NOTE — Telephone Encounter (Signed)
 Noted! Thank you

## 2023-10-16 ENCOUNTER — Encounter: Payer: Self-pay | Admitting: Nurse Practitioner

## 2023-10-16 ENCOUNTER — Telehealth (INDEPENDENT_AMBULATORY_CARE_PROVIDER_SITE_OTHER): Admitting: Nurse Practitioner

## 2023-10-16 ENCOUNTER — Ambulatory Visit: Admitting: Nurse Practitioner

## 2023-10-16 VITALS — Ht 63.0 in | Wt 154.0 lb

## 2023-10-16 DIAGNOSIS — R300 Dysuria: Secondary | ICD-10-CM

## 2023-10-16 MED ORDER — CEPHALEXIN 500 MG PO CAPS: 500.0000 mg | ORAL_CAPSULE | Freq: Two times a day (BID) | ORAL | 0 refills | Status: DC

## 2023-10-16 NOTE — Progress Notes (Signed)
 Virtual Visit via Video Note  I connected with Kristi Carter on 10/16/23 by a video enabled telemedicine application and verified that I am speaking with the correct person using two identifiers.  Patient Location: Home Provider Location: Office/Clinic  I discussed the limitations, risks, security, and privacy concerns of performing an evaluation and management service by video and the availability of in person appointments. I also discussed with the patient that there may be a patient responsible charge related to this service. The patient expressed understanding and agreed to proceed.  Subjective: PCP: Vincente Saber, NP  Chief Complaint  Patient presents with   Urinary Tract Infection   HPI  Kristi Carter is a 63 year old female who presents with urinary issues.  She has experienced dysuria, a burning sensation, and a feeling of pressure for the past two days.  She is followed by urology due to recurrent UTIs.  She has been using estrogen cream intermittently, probiotics, cranberry supplement and was started on trimethoprim  due to recurrent UTI's by the urologist. ROS: Per HPI  Current Outpatient Medications:    estradiol  (ESTRACE ) 0.1 MG/GM vaginal cream, Apply pea sized amount to tip of finger to urethra before bed. Wash hands well after application. Use Monday, Wednesday and Friday. Discard applicator., Disp: 42.5 g, Rfl: 12   levothyroxine  (SYNTHROID ) 50 MCG tablet, Take 1 tablet (50 mcg total) by mouth daily before breakfast., Disp: 90 tablet, Rfl: 3   valACYclovir  (VALTREX ) 500 MG tablet, Take 1 tablet (500 mg total) by mouth 2 (two) times daily for 3 days as needed, Disp: 30 tablet, Rfl: 0   atorvastatin  (LIPITOR) 40 MG tablet, Take 1 tablet (40 mg total) by mouth daily., Disp: 90 tablet, Rfl: 2   clonazePAM  (KLONOPIN ) 0.5 MG tablet, Take 1 tablet (0.5 mg total) by mouth 2 (two) times daily as needed for anxiety., Disp: 60 tablet, Rfl: 1   esomeprazole  (NEXIUM )  40 MG capsule, Take 1 capsule (40 mg total) by mouth daily., Disp: 90 capsule, Rfl: 1   fluconazole  (DIFLUCAN ) 150 MG tablet, Take 1 tablet (150 mg total) by mouth daily., Disp: 2 tablet, Rfl: 0   methenamine  (HIPREX ) 1 g tablet, Take 1 tablet (1 g total) by mouth 2 (two) times daily with a meal., Disp: 60 tablet, Rfl: 11  Observations/Objective: Today's Vitals   10/16/23 1642  Weight: 154 lb (69.9 kg)  Height: 5' 3 (1.6 m)   Physical Exam Constitutional:      General: She is not in acute distress.    Appearance: Normal appearance. She is not ill-appearing.  Eyes:     Conjunctiva/sclera: Conjunctivae normal.  Pulmonary:     Effort: No respiratory distress.  Neurological:     Mental Status: She is alert.  Psychiatric:        Mood and Affect: Mood normal.        Behavior: Behavior normal.        Thought Content: Thought content normal.        Judgment: Judgment normal.     Assessment and Plan: Dysuria Assessment & Plan: Acute UTI suspected due to positive nitrites. Awaiting urine culture for confirmation.  - Prescribed Cefalexin 500 mg BID for 5 days. - Advised increased fluid intake. - Discontinued trimethoprim , continued Cefalexin.   Other orders -     Fluconazole ; Take 1 tablet (150 mg total) by mouth daily.  Dispense: 2 tablet; Refill: 0    Follow Up Instructions: No follow-ups on file.   I  discussed the assessment and treatment plan with the patient. The patient was provided an opportunity to ask questions, and all were answered. The patient agreed with the plan and demonstrated an understanding of the instructions.   The patient was advised to call back or seek an in-person evaluation if the symptoms worsen or if the condition fails to improve as anticipated.  The above assessment and management plan was discussed with the patient. The patient verbalized understanding of and has agreed to the management plan.   Sabria Florido, NP

## 2023-10-18 ENCOUNTER — Ambulatory Visit: Payer: Self-pay | Admitting: Nurse Practitioner

## 2023-10-18 DIAGNOSIS — N3001 Acute cystitis with hematuria: Secondary | ICD-10-CM

## 2023-10-18 LAB — URINE CULTURE
MICRO NUMBER:: 16800935
SPECIMEN QUALITY:: ADEQUATE

## 2023-10-18 LAB — URINALYSIS, ROUTINE W REFLEX MICROSCOPIC
Bilirubin Urine: NEGATIVE
Glucose, UA: NEGATIVE
Hyaline Cast: NONE SEEN /LPF
Ketones, ur: NEGATIVE
Nitrite: POSITIVE — AB
Protein, ur: NEGATIVE
Specific Gravity, Urine: 1.025 (ref 1.001–1.035)
pH: 5 (ref 5.0–8.0)

## 2023-10-18 LAB — MICROSCOPIC MESSAGE

## 2023-10-18 MED ORDER — NITROFURANTOIN MONOHYD MACRO 100 MG PO CAPS: 100.0000 mg | ORAL_CAPSULE | Freq: Two times a day (BID) | ORAL | 0 refills | Status: DC

## 2023-10-19 ENCOUNTER — Other Ambulatory Visit (HOSPITAL_COMMUNITY): Payer: Self-pay

## 2023-10-19 MED ORDER — FLUCONAZOLE 150 MG PO TABS: 150.0000 mg | ORAL_TABLET | Freq: Every day | ORAL | 0 refills | Status: AC

## 2023-10-20 ENCOUNTER — Other Ambulatory Visit: Payer: Self-pay

## 2023-10-20 ENCOUNTER — Ambulatory Visit: Payer: Commercial Managed Care - PPO | Admitting: Physician Assistant

## 2023-10-20 ENCOUNTER — Encounter: Payer: Self-pay | Admitting: Pharmacist

## 2023-10-20 ENCOUNTER — Other Ambulatory Visit (HOSPITAL_COMMUNITY): Payer: Self-pay

## 2023-10-23 ENCOUNTER — Other Ambulatory Visit (HOSPITAL_COMMUNITY): Payer: Self-pay

## 2023-10-23 ENCOUNTER — Other Ambulatory Visit (HOSPITAL_BASED_OUTPATIENT_CLINIC_OR_DEPARTMENT_OTHER): Payer: Self-pay

## 2023-10-23 ENCOUNTER — Other Ambulatory Visit: Payer: Self-pay

## 2023-10-26 ENCOUNTER — Ambulatory Visit (INDEPENDENT_AMBULATORY_CARE_PROVIDER_SITE_OTHER): Admitting: Physician Assistant

## 2023-10-26 ENCOUNTER — Other Ambulatory Visit: Payer: Self-pay

## 2023-10-26 ENCOUNTER — Other Ambulatory Visit (HOSPITAL_COMMUNITY): Payer: Self-pay

## 2023-10-26 VITALS — BP 127/84 | HR 76 | Ht 63.0 in | Wt 159.0 lb

## 2023-10-26 DIAGNOSIS — N390 Urinary tract infection, site not specified: Secondary | ICD-10-CM

## 2023-10-26 LAB — MICROSCOPIC EXAMINATION: Epithelial Cells (non renal): >10 /HPF — AB (ref 0–10)

## 2023-10-26 LAB — URINALYSIS, COMPLETE
Bilirubin, UA: NEGATIVE
Glucose, UA: NEGATIVE
Ketones, UA: NEGATIVE
Leukocytes,UA: NEGATIVE
Nitrite, UA: NEGATIVE
Protein,UA: NEGATIVE
RBC, UA: NEGATIVE
Specific Gravity, UA: 1.02 (ref 1.005–1.030)
Urobilinogen, Ur: 0.2 mg/dL (ref 0.2–1.0)
pH, UA: 6 (ref 5.0–7.5)

## 2023-10-26 MED ORDER — METHENAMINE HIPPURATE 1 G PO TABS
1.0000 g | ORAL_TABLET | Freq: Two times a day (BID) | ORAL | 11 refills | Status: DC
  Filled 2023-10-26: qty 60, 30d supply, fill #0
  Filled 2023-11-23: qty 60, 30d supply, fill #1
  Filled 2023-12-10 – 2024-01-03 (×2): qty 60, 30d supply, fill #2
  Filled 2024-02-02: qty 60, 30d supply, fill #3
  Filled 2024-03-01: qty 60, 30d supply, fill #4

## 2023-10-26 NOTE — Progress Notes (Signed)
 10/26/2023 11:31 AM   Kristi Carter 1960-08-10 [id number]  CC: Chief Complaint  Patient presents with   Follow-up   Recurrent UTI   HPI: Kristi Carter is a 63 y.o. female with PMH recurrent UTI with possibly related microscopic hematuria on estrogen cream, probiotics, and cranberry supplements who presents today for follow-up on suppressive trimethoprim .   Today she reports she had a virtual visit 10 days ago for another UTI.  Urine culture grew MDR Citrobacter freundii.  She was prescribed culture appropriate Macrobid , which she completed 2 days ago.  Her symptoms have now resolved.  This was her first UTI on suppressive antibiotics.  She was at the end of her 34-month course of trimethoprim  when her symptoms began.  She skipped about the last 3-4 doses.  In-office UA pan negative; urine microscopy with >10 epithelial cells/hpf.  PMH: Past Medical History:  Diagnosis Date   A-fib [facility])    Allergy    Antibiotic-induced yeast infection 07/20/2019   Anxiety    GERD (gastroesophageal reflux disease)    Hypothyroid     Surgical History: Past Surgical History:  Procedure Laterality Date   AUGMENTATION MAMMAPLASTY Bilateral 1990   saline   BIOPSY  02/11/2023   Procedure: BIOPSY;  Surgeon: Therisa Bi, MD;  Location: [facility] ENDOSCOPY;  Service: Gastroenterology;;   CARDIAC ELECTROPHYSIOLOGY MAPPING AND ABLATION     COLONOSCOPY WITH PROPOFOL  N/A 04/13/2020   Procedure: COLONOSCOPY WITH PROPOFOL ;  Surgeon: Therisa Bi, MD;  Location: [facility] ENDOSCOPY;  Service: Gastroenterology;  Laterality: N/A;   ESOPHAGOGASTRODUODENOSCOPY (EGD) WITH PROPOFOL  N/A 04/13/2020   Procedure: ESOPHAGOGASTRODUODENOSCOPY (EGD) WITH PROPOFOL ;  Surgeon: Therisa Bi, MD;  Location: [facility] ENDOSCOPY;  Service: Gastroenterology;  Laterality: N/A;   ESOPHAGOGASTRODUODENOSCOPY (EGD) WITH PROPOFOL  N/A 07/16/2020   Procedure: ESOPHAGOGASTRODUODENOSCOPY (EGD) WITH PROPOFOL ;  Surgeon: Therisa Bi, MD;  Location:  [facility] ENDOSCOPY;  Service: Gastroenterology;  Laterality: N/A;   ESOPHAGOGASTRODUODENOSCOPY (EGD) WITH PROPOFOL  N/A 02/11/2023   Procedure: ESOPHAGOGASTRODUODENOSCOPY (EGD) WITH PROPOFOL ;  Surgeon: Therisa Bi, MD;  Location: [facility] ENDOSCOPY;  Service: Gastroenterology;  Laterality: N/A;    Home Medications:  Allergies as of 10/26/2023       Reactions   Other    Cats, Mold and Feathers   Prednisone    Other reaction(s): Vomiting   Tape         Medication List        Accurate as of October 26, 2023 11:31 AM. If you have any questions, ask your nurse or doctor.          STOP taking these medications    ibuprofen  800 MG tablet Commonly known as: ADVIL        TAKE these medications    atorvastatin  40 MG tablet Commonly known as: LIPITOR Take 1 tablet (40 mg total) by mouth daily.   clonazePAM  0.5 MG tablet Commonly known as: KLONOPIN  Take 1 tablet (0.5 mg total) by mouth 2 (two) times daily as needed for anxiety.   esomeprazole  40 MG capsule Commonly known as: NEXIUM  Take 1 capsule (40 mg total) by mouth daily.   estradiol  0.1 MG/GM vaginal cream Commonly known as: ESTRACE  Apply pea sized amount to tip of finger to urethra before bed. Wash hands well after application. Use Monday, Wednesday and Friday. Discard applicator.   fluconazole  150 MG tablet Commonly known as: Diflucan  Take 1 tablet (150 mg total) by mouth daily. What changed: Another medication with the same name was removed. Continue taking this medication, and follow the directions you  see here.   levothyroxine  50 MCG tablet Commonly known as: SYNTHROID  Take 1 tablet (50 mcg total) by mouth daily before breakfast.   nitrofurantoin  (macrocrystal-monohydrate) 100 MG capsule Commonly known as: Macrobid  Take 1 capsule (100 mg total) by mouth 2 (two) times daily.   trimethoprim  100 MG tablet Commonly known as: TRIMPEX  Take 1 tablet (100 mg total) by mouth daily.   valACYclovir  500 MG tablet Commonly  known as: VALTREX  Take 1 tablet (500 mg total) by mouth 2 (two) times daily for 3 days as needed        Allergies:  Allergies  Allergen Reactions   Other     Cats, Mold and Feathers   Prednisone     Other reaction(s): Vomiting   Tape     Family History: Family History  Problem Relation Age of Onset   Healthy Father    Cancer Paternal Uncle        started with prostate and spread to entire body   Stroke Paternal Grandmother    Heart attack Paternal Grandfather     Social History:   reports that she has never smoked. She has never used smokeless tobacco. She reports current alcohol use of about 10.0 standard drinks of alcohol per week. She reports that she does not use drugs.  Physical Exam: BP 127/84 (BP Location: Left Arm, Patient Position: Sitting, Cuff Size: Normal)   Pulse 76   Ht 5' 3 (1.6 m)   Wt 159 lb (72.1 kg)   SpO2 98%   BMI 28.17 kg/m   Constitutional:  Alert and oriented, no acute distress, nontoxic appearing HEENT: [place], AT Cardiovascular: No clubbing, cyanosis, or edema Respiratory: Normal respiratory effort, no increased work of breathing Skin: No rashes, bruises or suspicious lesions Neurologic: Grossly intact, no focal deficits, moving all 4 extremities Psychiatric: Normal mood and affect  Laboratory Data: Results for orders placed or performed in visit on 10/26/23  Microscopic Examination   Collection Time: 10/26/23 11:21 AM   Urine  Result Value Ref Range   WBC, UA 0-5 0 - 5 /hpf   RBC, Urine 0-2 0 - 2 /hpf   Epithelial Cells (non renal) >10 (A) 0 - 10 /hpf   Mucus, UA Present (A) Not Estab.   Bacteria, UA Few None seen/Few  Urinalysis, Complete   Collection Time: 10/26/23 11:21 AM  Result Value Ref Range   Specific Gravity, UA 1.020 1.005 - 1.030   pH, UA 6.0 5.0 - 7.5   Color, UA Yellow Yellow   Appearance Ur Clear Clear   Leukocytes,UA Negative Negative   Protein,UA Negative Negative/Trace   Glucose, UA Negative Negative    Ketones, UA Negative Negative   RBC, UA Negative Negative   Bilirubin, UA Negative Negative   Urobilinogen, Ur 0.2 0.2 - 1.0 mg/dL   Nitrite, UA Negative Negative   Microscopic Examination Comment    Microscopic Examination See below:    Assessment & Plan:   1. Recurrent UTI (Primary) She had 1 breakthrough UTI with a rather resistant bacteria recently.  Symptoms have now resolved on culture appropriate antibiotics.  At this point, I hesitate to switch her suppressive antibiotic since this could worsen her resistance pattern.  I offered her Hiprex  as an alternative and she would like to try this.  I will see her back in another 6 months.  Will keep a low threshold to repeat imaging if she continues to have breakthrough UTIs. - Urinalysis, Complete - methenamine  (HIPREX ) 1 g tablet; Take  1 tablet (1 g total) by mouth 2 (two) times daily with a meal.  Dispense: 60 tablet; Refill: 11   Return in about 6 months (around 04/27/2024) for rUTI follow up with UA.  Lucie Hones, PA-C  [facility] [place] [address] [place], [place] [address] [phone]

## 2023-10-30 ENCOUNTER — Other Ambulatory Visit: Payer: Self-pay

## 2023-10-30 ENCOUNTER — Encounter: Payer: 59 | Admitting: Nurse Practitioner

## 2023-10-30 DIAGNOSIS — R899 Unspecified abnormal finding in specimens from other organs, systems and tissues: Secondary | ICD-10-CM

## 2023-10-31 ENCOUNTER — Ambulatory Visit: Payer: Self-pay | Admitting: Nurse Practitioner

## 2023-10-31 LAB — LIPID PANEL
Chol/HDL Ratio: 2.8 ratio (ref 0.0–4.4)
Cholesterol, Total: 214 mg/dL — ABNORMAL HIGH (ref 100–199)
HDL: 77 mg/dL (ref >39)
LDL Chol Calc (NIH): 102 mg/dL — ABNORMAL HIGH (ref 0–99)
Triglycerides: 208 mg/dL — ABNORMAL HIGH (ref 0–149)
VLDL Cholesterol Cal: 35 mg/dL (ref 5–40)

## 2023-10-31 LAB — HEMOGLOBIN A1C
Est. average glucose Bld gHb Est-mCnc: 108 mg/dL
Hgb A1c MFr Bld: 5.4 % (ref 4.8–5.6)

## 2023-10-31 LAB — COMPREHENSIVE METABOLIC PANEL WITH GFR
ALT: 23 IU/L (ref 0–32)
AST: 36 IU/L (ref 0–40)
Albumin: 4 g/dL (ref 3.9–4.9)
Alkaline Phosphatase: 104 IU/L (ref 44–121)
BUN/Creatinine Ratio: 13 (ref 12–28)
BUN: 9 mg/dL (ref 8–27)
Bilirubin Total: 0.6 mg/dL (ref 0.0–1.2)
CO2: 21 mmol/L (ref 20–29)
Calcium: 9.2 mg/dL (ref 8.7–10.3)
Chloride: 105 mmol/L (ref 96–106)
Creatinine, Ser: 0.7 mg/dL (ref 0.57–1.00)
Globulin, Total: 2.4 g/dL (ref 1.5–4.5)
Glucose: 85 mg/dL (ref 70–99)
Potassium: 4 mmol/L (ref 3.5–5.2)
Sodium: 142 mmol/L (ref 134–144)
Total Protein: 6.4 g/dL (ref 6.0–8.5)
eGFR: 98 mL/min/1.73 (ref >59)

## 2023-10-31 LAB — CBC WITH DIFFERENTIAL/PLATELET
Basophils Absolute: 0.1 x10E3/uL (ref 0.0–0.2)
Basos: 1 %
EOS (ABSOLUTE): 0.1 x10E3/uL (ref 0.0–0.4)
Eos: 3 %
Hematocrit: 40.2 % (ref 34.0–46.6)
Hemoglobin: 13.6 g/dL (ref 11.1–15.9)
Immature Grans (Abs): 0 x10E3/uL (ref 0.0–0.1)
Immature Granulocytes: 0 %
Lymphocytes Absolute: 1.7 x10E3/uL (ref 0.7–3.1)
Lymphs: 34 %
MCH: 33.4 pg — ABNORMAL HIGH (ref 26.6–33.0)
MCHC: 33.8 g/dL (ref 31.5–35.7)
MCV: 99 fL — ABNORMAL HIGH (ref 79–97)
Monocytes Absolute: 0.5 x10E3/uL (ref 0.1–0.9)
Monocytes: 11 %
Neutrophils Absolute: 2.7 x10E3/uL (ref 1.4–7.0)
Neutrophils: 51 %
Platelets: 281 x10E3/uL (ref 150–450)
RBC: 4.07 x10E6/uL (ref 3.77–5.28)
RDW: 12.7 % (ref 11.7–15.4)
WBC: 5.2 x10E3/uL (ref 3.4–10.8)

## 2023-11-03 ENCOUNTER — Other Ambulatory Visit (HOSPITAL_COMMUNITY): Payer: Self-pay

## 2023-11-03 ENCOUNTER — Ambulatory Visit (INDEPENDENT_AMBULATORY_CARE_PROVIDER_SITE_OTHER): Admitting: Nurse Practitioner

## 2023-11-03 ENCOUNTER — Encounter: Payer: Self-pay | Admitting: Nurse Practitioner

## 2023-11-03 VITALS — BP 116/74 | HR 78 | Temp 97.8°F | Ht 63.0 in | Wt 156.8 lb

## 2023-11-03 DIAGNOSIS — Z1231 Encounter for screening mammogram for malignant neoplasm of breast: Secondary | ICD-10-CM | POA: Diagnosis not present

## 2023-11-03 DIAGNOSIS — E785 Hyperlipidemia, unspecified: Secondary | ICD-10-CM | POA: Diagnosis not present

## 2023-11-03 DIAGNOSIS — Z0000 Encounter for general adult medical examination without abnormal findings: Secondary | ICD-10-CM | POA: Diagnosis not present

## 2023-11-03 MED ORDER — ESOMEPRAZOLE MAGNESIUM 40 MG PO CPDR
40.0000 mg | DELAYED_RELEASE_CAPSULE | Freq: Every day | ORAL | 1 refills | Status: AC
  Filled 2023-11-03 – 2024-01-03 (×3): qty 90, 90d supply, fill #0
  Filled 2024-01-16 – 2024-03-29 (×2): qty 90, 90d supply, fill #1

## 2023-11-03 MED ORDER — ATORVASTATIN CALCIUM 40 MG PO TABS
40.0000 mg | ORAL_TABLET | Freq: Every day | ORAL | 2 refills | Status: AC
  Filled 2023-11-03 – 2024-01-14 (×3): qty 90, 90d supply, fill #0
  Filled 2024-01-16 – 2024-03-29 (×2): qty 90, 90d supply, fill #1

## 2023-11-03 NOTE — Assessment & Plan Note (Addendum)
 Uptodate on Tdap and Papsmear. Declined breast examination. Pt will update shingles vaccine record via mychart. -Encouraged patient to consume a balanced diet and regular exercise regimen. -Advised to see an eye doctor and dentist annually.  -Lab result discussed.

## 2023-11-03 NOTE — Progress Notes (Signed)
 Established Patient Office Visit  Subjective:  Patient ID: Kristi Carter, female    DOB: October 21, 1960  Age: 63 y.o. MRN: [id number]  CC:  Chief Complaint  Patient presents with   Annual Exam   HPI  Kristi Carter presents for annual physical exam. Diet: Patient eat some chicken. No fish. Mostly fruits and veggies. Patient eat some fried food. Patient drinks water, coffee. Exercise: walking weekends Vaccine  Flu: 2024 Tetanus: 2016 COVID: Pfizerx4 Shingles: pt states completed. Will send the record over MyChart  Colonoscopy: 2022,repeat in 10 years Pap smear: 2024 Mammogram: Due  Family history:  Colon cancer: No  Breast cancer: No  Dentist: every 6 months Ophthalmology: every year  HPI   Past Medical History:  Diagnosis Date   A-fib [facility])    ablation in 2015   Allergy    Antibiotic-induced yeast infection 07/20/2019   Anxiety    GERD (gastroesophageal reflux disease)    Hypothyroid     Past Surgical History:  Procedure Laterality Date   AUGMENTATION MAMMAPLASTY Bilateral 1990   saline   BIOPSY  02/11/2023   Procedure: BIOPSY;  Surgeon: Therisa Bi, MD;  Location: [facility] ENDOSCOPY;  Service: Gastroenterology;;   CARDIAC ELECTROPHYSIOLOGY MAPPING AND ABLATION     COLONOSCOPY WITH PROPOFOL  N/A 04/13/2020   Procedure: COLONOSCOPY WITH PROPOFOL ;  Surgeon: Therisa Bi, MD;  Location: [facility] ENDOSCOPY;  Service: Gastroenterology;  Laterality: N/A;   ESOPHAGOGASTRODUODENOSCOPY (EGD) WITH PROPOFOL  N/A 04/13/2020   Procedure: ESOPHAGOGASTRODUODENOSCOPY (EGD) WITH PROPOFOL ;  Surgeon: Therisa Bi, MD;  Location: [facility] ENDOSCOPY;  Service: Gastroenterology;  Laterality: N/A;   ESOPHAGOGASTRODUODENOSCOPY (EGD) WITH PROPOFOL  N/A 07/16/2020   Procedure: ESOPHAGOGASTRODUODENOSCOPY (EGD) WITH PROPOFOL ;  Surgeon: Therisa Bi, MD;  Location: [facility] ENDOSCOPY;  Service: Gastroenterology;  Laterality: N/A;   ESOPHAGOGASTRODUODENOSCOPY (EGD) WITH PROPOFOL  N/A 02/11/2023   Procedure:  ESOPHAGOGASTRODUODENOSCOPY (EGD) WITH PROPOFOL ;  Surgeon: Therisa Bi, MD;  Location: [facility] ENDOSCOPY;  Service: Gastroenterology;  Laterality: N/A;    Family History  Problem Relation Age of Onset   Healthy Father    Cancer Paternal Uncle        started with prostate and spread to entire body   Stroke Paternal Grandmother    Heart attack Paternal Grandfather     Social History   Socioeconomic History   Marital status: Married    Spouse name: Not on file   Number of children: Not on file   Years of education: Not on file   Highest education level: Not on file  Occupational History   Not on file  Tobacco Use   Smoking status: Never   Smokeless tobacco: Never  Vaping Use   Vaping status: Never Used  Substance and Sexual Activity   Alcohol use: Yes    Alcohol/week: 10.0 standard drinks of alcohol    Types: 10 Glasses of wine per week   Drug use: Never   Sexual activity: Not Currently    Birth control/protection: Post-menopausal  Other Topics Concern   Not on file  Social History Narrative   Not on file   Social Drivers of Health   Financial Resource Strain: Not on file  Food Insecurity: Not on file  Transportation Needs: Not on file  Physical Activity: Not on file  Stress: Not on file  Social Connections: Not on file  Intimate Partner Violence: Not on file     Outpatient Medications Prior to Visit  Medication Sig Dispense Refill   clonazePAM  (KLONOPIN ) 0.5 MG tablet Take 1 tablet (0.5 mg total)  by mouth 2 (two) times daily as needed for anxiety. 60 tablet 1   estradiol  (ESTRACE ) 0.1 MG/GM vaginal cream Apply pea sized amount to tip of finger to urethra before bed. Wash hands well after application. Use Monday, Wednesday and Friday. Discard applicator. 42.5 g 12   fluconazole  (DIFLUCAN ) 150 MG tablet Take 1 tablet (150 mg total) by mouth daily. 2 tablet 0   levothyroxine  (SYNTHROID ) 50 MCG tablet Take 1 tablet (50 mcg total) by mouth daily before breakfast. 90  tablet 3   methenamine  (HIPREX ) 1 g tablet Take 1 tablet (1 g total) by mouth 2 (two) times daily with a meal. 60 tablet 11   valACYclovir  (VALTREX ) 500 MG tablet Take 1 tablet (500 mg total) by mouth 2 (two) times daily for 3 days as needed 30 tablet 0   atorvastatin  (LIPITOR) 40 MG tablet Take 1 tablet (40 mg total) by mouth daily. 90 tablet 0   esomeprazole  (NEXIUM ) 40 MG capsule Take 1 capsule (40 mg total) by mouth daily. 90 capsule 1   No facility-administered medications prior to visit.    Allergies  Allergen Reactions   Other     Cats, Mold and Feathers   Prednisone     Other reaction(s): Vomiting   Tape     ROS Review of Systems Negative unless indicated in HPI.    Objective:    Physical Exam Constitutional:      Appearance: Normal appearance. She is normal weight.  HENT:     Head: Normocephalic.     Right Ear: Tympanic membrane normal.     Left Ear: Tympanic membrane normal.     Mouth/Throat:     Mouth: Mucous membranes are moist.  Eyes:     Extraocular Movements: Extraocular movements intact.     Conjunctiva/sclera: Conjunctivae normal.     Pupils: Pupils are equal, round, and reactive to light.  Neck:     Thyroid : No thyroid  mass or thyroid  tenderness.  Cardiovascular:     Rate and Rhythm: Normal rate and regular rhythm.     Pulses: Normal pulses.     Heart sounds: Normal heart sounds. No murmur heard. Pulmonary:     Effort: Pulmonary effort is normal.     Breath sounds: Normal breath sounds.  Chest:     Comments: Declined breast examination Abdominal:     General: Bowel sounds are normal.     Palpations: Abdomen is soft. There is no mass.     Tenderness: There is no abdominal tenderness. There is no rebound.  Musculoskeletal:        General: No swelling.     Cervical back: Neck supple. No tenderness.     Right lower leg: No edema.     Left lower leg: No edema.  Skin:    Findings: No bruising, erythema or rash.  Neurological:     General: No  focal deficit present.     Mental Status: She is alert and oriented to person, place, and time. Mental status is at baseline.  Psychiatric:        Mood and Affect: Mood normal.        Behavior: Behavior normal.        Thought Content: Thought content normal.        Judgment: Judgment normal.     BP 116/74   Pulse 78   Temp 97.8 F (36.6 C)   Ht 5' 3 (1.6 m)   Wt 156 lb 12.8 oz (71.1 kg)  SpO2 96%   BMI 27.78 kg/m  Wt Readings from Last 3 Encounters:  11/03/23 156 lb 12.8 oz (71.1 kg)  10/26/23 159 lb (72.1 kg)  10/16/23 154 lb (69.9 kg)     Health Maintenance  Topic Date Due   HIV Screening  Never done   Hepatitis C Screening  Never done   Pneumococcal Vaccine: 50+ Years (1 of 1 - PCV) Never done   COVID-19 Vaccine (5 - 2024-25 season) 11/18/2023 (Originally 11/09/2022)   INFLUENZA VACCINE  06/07/2024 (Originally 10/09/2023)   MAMMOGRAM  01/25/2024   DTaP/Tdap/Td (2 - Td or Tdap) 09/21/2024   Cervical Cancer Screening (HPV/Pap Cotest)  06/09/2027   Colonoscopy  04/13/2030   Hepatitis B Vaccines 19-59 Average Risk  Aged Out   HPV VACCINES  Aged Out   Meningococcal B Vaccine  Aged Out   Zoster Vaccines- Shingrix  Discontinued    There are no preventive care reminders to display for this patient.  Lab Results  Component Value Date   TSH 1.74 04/28/2023   Lab Results  Component Value Date   WBC 5.2 10/30/2023   HGB 13.6 10/30/2023   HCT 40.2 10/30/2023   MCV 99 (H) 10/30/2023   PLT 281 10/30/2023   Lab Results  Component Value Date   NA 142 10/30/2023   K 4.0 10/30/2023   CO2 21 10/30/2023   GLUCOSE 85 10/30/2023   BUN 9 10/30/2023   CREATININE 0.70 10/30/2023   BILITOT 0.6 10/30/2023   ALKPHOS 104 10/30/2023   AST 36 10/30/2023   ALT 23 10/30/2023   PROT 6.4 10/30/2023   ALBUMIN 4.0 10/30/2023   CALCIUM  9.2 10/30/2023   ANIONGAP 14 08/21/2020   EGFR 98 10/30/2023   GFR 100.05 10/28/2022   Lab Results  Component Value Date   CHOL 214 (H)  10/30/2023   Lab Results  Component Value Date   HDL 77 10/30/2023   Lab Results  Component Value Date   LDLCALC 102 (H) 10/30/2023   Lab Results  Component Value Date   TRIG 208 (H) 10/30/2023   Lab Results  Component Value Date   CHOLHDL 2.8 10/30/2023   Lab Results  Component Value Date   HGBA1C 5.4 10/30/2023      Assessment & Plan:  Preventative health care Assessment & Plan: Uptodate on Tdap and Papsmear. Declined breast examination. Pt will update shingles vaccine record via mychart. -Encouraged patient to consume a balanced diet and regular exercise regimen. -Advised to see an eye doctor and dentist annually.  -Lab result discussed.     Hyperlipidemia, unspecified hyperlipidemia type -     Atorvastatin  Calcium ; Take 1 tablet (40 mg total) by mouth daily.  Dispense: 90 tablet; Refill: 2  Screening mammogram, encounter for -     3D Screening Mammogram, Left and Right; Future  Other orders -     Esomeprazole  Magnesium ; Take 1 capsule (40 mg total) by mouth daily.  Dispense: 90 capsule; Refill: 1    Follow-up: Return in about 6 months (around 05/05/2024) for chronic management.   Pecola Haxton, NP

## 2023-11-05 ENCOUNTER — Other Ambulatory Visit: Payer: Self-pay | Admitting: Nurse Practitioner

## 2023-11-05 ENCOUNTER — Other Ambulatory Visit: Payer: Self-pay

## 2023-11-05 NOTE — Telephone Encounter (Unsigned)
 Copied from CRM [id number]. Topic: Clinical - Medication Refill >> Nov 05, 2023 11:38 AM Deaijah H wrote: Medication: clonazePAM  (KLONOPIN ) 0.5 MG tablet  Has the patient contacted their pharmacy? Yes (Agent: If no, request that the patient contact the pharmacy for the refill. If patient does not wish to contact the pharmacy document the reason why and proceed with request.) (Agent: If yes, when and what did the pharmacy advise?) Did not receive from Dr with other scripts  This is the patient's preferred pharmacy:  [facility] [address], [place] [address] Phone: [phone]  Fax: [phone]    Is this the correct pharmacy for this prescription? Yes If no, delete pharmacy and type the correct one.   Has the prescription been filled recently? No  Is the patient out of the medication? Yes  Has the patient been seen for an appointment in the last year OR does the patient have an upcoming appointment? Yes  Can we respond through MyChart? Yes  Agent: Please be advised that Rx refills may take up to 3 business days. We ask that you follow-up with your pharmacy.

## 2023-11-06 ENCOUNTER — Other Ambulatory Visit: Payer: Self-pay

## 2023-11-06 MED ORDER — CLONAZEPAM 0.5 MG PO TABS
0.5000 mg | ORAL_TABLET | Freq: Two times a day (BID) | ORAL | 1 refills | Status: DC | PRN
  Filled 2023-11-06: qty 60, 30d supply, fill #0
  Filled 2023-12-14: qty 60, 30d supply, fill #1

## 2023-11-06 NOTE — Telephone Encounter (Signed)
Controlled substance database reviewed.  Medication sent to pharmacy.

## 2023-11-06 NOTE — Telephone Encounter (Signed)
 Medication was sent on 11/05/23

## 2023-11-08 NOTE — Assessment & Plan Note (Signed)
 Acute UTI suspected due to positive nitrites. Awaiting urine culture for confirmation.  - Prescribed Cefalexin 500 mg BID for 5 days. - Advised increased fluid intake. - Discontinued trimethoprim , continued Cefalexin.

## 2023-11-11 ENCOUNTER — Other Ambulatory Visit: Payer: Self-pay

## 2023-11-11 ENCOUNTER — Other Ambulatory Visit (HOSPITAL_COMMUNITY): Payer: Self-pay

## 2023-11-11 NOTE — Telephone Encounter (Signed)
 Okay thank you

## 2023-11-23 ENCOUNTER — Other Ambulatory Visit: Payer: Self-pay

## 2023-11-24 ENCOUNTER — Encounter: Payer: Self-pay | Admitting: Dermatology

## 2023-11-24 ENCOUNTER — Ambulatory Visit (INDEPENDENT_AMBULATORY_CARE_PROVIDER_SITE_OTHER): Admitting: Dermatology

## 2023-11-24 ENCOUNTER — Other Ambulatory Visit: Payer: Self-pay

## 2023-11-24 DIAGNOSIS — D1801 Hemangioma of skin and subcutaneous tissue: Secondary | ICD-10-CM

## 2023-11-24 DIAGNOSIS — L814 Other melanin hyperpigmentation: Secondary | ICD-10-CM

## 2023-11-24 DIAGNOSIS — L821 Other seborrheic keratosis: Secondary | ICD-10-CM | POA: Diagnosis not present

## 2023-11-24 DIAGNOSIS — L820 Inflamed seborrheic keratosis: Secondary | ICD-10-CM

## 2023-11-24 DIAGNOSIS — Z1283 Encounter for screening for malignant neoplasm of skin: Secondary | ICD-10-CM | POA: Diagnosis not present

## 2023-11-24 DIAGNOSIS — W57XXXA Bitten or stung by nonvenomous insect and other nonvenomous arthropods, initial encounter: Secondary | ICD-10-CM | POA: Diagnosis not present

## 2023-11-24 DIAGNOSIS — I781 Nevus, non-neoplastic: Secondary | ICD-10-CM

## 2023-11-24 DIAGNOSIS — L578 Other skin changes due to chronic exposure to nonionizing radiation: Secondary | ICD-10-CM | POA: Diagnosis not present

## 2023-11-24 DIAGNOSIS — S90562A Insect bite (nonvenomous), left ankle, initial encounter: Secondary | ICD-10-CM

## 2023-11-24 DIAGNOSIS — W908XXA Exposure to other nonionizing radiation, initial encounter: Secondary | ICD-10-CM

## 2023-11-24 DIAGNOSIS — D229 Melanocytic nevi, unspecified: Secondary | ICD-10-CM

## 2023-11-24 DIAGNOSIS — H61002 Unspecified perichondritis of left external ear: Secondary | ICD-10-CM | POA: Diagnosis not present

## 2023-11-24 DIAGNOSIS — L720 Epidermal cyst: Secondary | ICD-10-CM

## 2023-11-24 NOTE — Patient Instructions (Addendum)
 Recommend OTC adapalene 0.1% gel pea sized amount to entire face nightly as tolerated.  This can be used to treat acne (whiteheads, blackheads) and milia (tiny firm white cysts).  It may cause dry irritated skin with initial use, and to minimize this, we recommend applying a light moisturizer to face before applying adapalene and/or applying it less frequently.  OTC brands include Differin 0.1% gel (Galderma), Adapalene 0.1% gel (Neutrogena), and Effaclar gel ( La Roche Posay).  They are found in the acne section on the pharmacy.    Cryotherapy Aftercare  Wash gently with soap and water everyday.   Apply Vaseline Jelly daily until healed.     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  Trauma or pressure from sleeping on the ear or from cell phone use and sun damage may be exacerbating factors.  Treatment may include using a C-shaped airplane neck pillow for sleeping on the side of the head so no pressure is on the ear.  Other treatments include topical or intralesional steroids; liquid nitrogen or laser destruction; shave removal or excision.  The condition can be difficult to treat and persist or recur despite treatment.   Rough spots on legs: Recommend starting moisturizer with exfoliant (Urea, Salicylic acid, or Lactic acid) one to two times daily to help smooth rough and bumpy skin.  OTC options include Cetaphil Rough and Bumpy lotion (Urea), Eucerin Roughness Relief lotion or spot treatment cream (Urea), CeraVe SA lotion/cream for Rough and Bumpy skin (Sal Acid), Gold Bond Rough and Bumpy cream (Sal Acid), and AmLactin 12% lotion/cream (Lactic Acid).  If applying in morning, also apply sunscreen to sun-exposed areas, since these exfoliating moisturizers can increase sensitivity to sun.   Recommend daily broad spectrum sunscreen SPF 30+  to sun-exposed areas, reapply every 2 hours as needed. Call for new or changing lesions.  Staying in the shade or wearing long sleeves, sun glasses (UVA+UVB protection) and wide brim hats (4-inch brim around the entire circumference of the hat) are also recommended for sun protection.     Melanoma ABCDEs  Melanoma is the most dangerous type of skin cancer, and is the leading cause of death from skin disease.  You are more likely to develop melanoma if you: Have light-colored skin, light-colored eyes, or red or blond hair Spend a lot of time in the sun Tan regularly, either outdoors or in a tanning bed Have had blistering sunburns, especially during childhood Have a close family member who has had a melanoma Have atypical moles or large birthmarks  Early detection of melanoma is key since treatment is typically straightforward and cure rates are extremely high if we catch it early.   The first sign of melanoma is often a change in a mole or a new dark spot.  The ABCDE system is a way of remembering the signs of melanoma.  A for asymmetry:  The two halves do not match. B for border:  The edges of the growth are irregular. C for color:  A mixture of colors are present instead of an even brown color. D for diameter:  Melanomas are usually (but not always) greater than 6mm - the size of a pencil eraser. E for evolution:  The spot keeps changing in size, shape, and color.  Please check your skin once per month between visits. You can use a small mirror in front and  a large mirror behind you to keep an eye on the back side or your body.   If you see any new or changing lesions before your next follow-up, please call to schedule a visit.  Please continue daily skin protection including broad spectrum sunscreen SPF 30+ to sun-exposed areas, reapplying every 2 hours as needed when you're outdoors.   Staying in the shade or wearing long sleeves, sun glasses (UVA+UVB protection) and wide brim hats  (4-inch brim around the entire circumference of the hat) are also recommended for sun protection.      Due to recent changes in healthcare laws, you may see results of your pathology and/or laboratory studies on MyChart before the doctors have had a chance to review them. We understand that in some cases there may be results that are confusing or concerning to you. Please understand that not all results are received at the same time and often the doctors may need to interpret multiple results in order to provide you with the best plan of care or course of treatment. Therefore, we ask that you please give us  2 business days to thoroughly review all your results before contacting the office for clarification. Should we see a critical lab result, you will be contacted sooner.   If You Need Anything After Your Visit  If you have any questions or concerns for your doctor, please call our main line at [phone] and press option 4 to reach your doctor's medical assistant. If no one answers, please leave a voicemail as directed and we will return your call as soon as possible. Messages left after 4 pm will be answered the following business day.   You may also send us  a message via MyChart. We typically respond to MyChart messages within 1-2 business days.  For prescription refills, please ask your pharmacy to contact our office. Our fax number is [phone].  If you have an urgent issue when the clinic is closed that cannot wait until the next business day, you can page your doctor at the number below.    Please note that while we do our best to be available for urgent issues outside of office hours, we are not available 24/7.   If you have an urgent issue and are unable to reach us , you may choose to seek medical care at your doctor's office, retail clinic, urgent care center, or emergency room.  If you have a medical emergency, please immediately call 911 or go to the emergency  department.  Pager Numbers  - Dr. Hester: [phone]  - Dr. Jackquline: [phone]  - Dr. Claudene: [phone]   - Dr. Raymund: [phone]  In the event of inclement weather, please call our main line at [phone] for an update on the status of any delays or closures.  Dermatology Medication Tips: Please keep the boxes that topical medications come in in order to help keep track of the instructions about where and how to use these. Pharmacies typically print the medication instructions only on the boxes and not directly on the medication tubes.   If your medication is too expensive, please contact our office at [phone] option 4 or send us  a message through MyChart.   We are unable to tell what your co-pay for medications will be in advance as this is different depending on your insurance coverage. However, we may be able to find a substitute medication at lower cost or fill out paperwork to get insurance to cover a needed medication.  If a prior authorization is required to get your medication covered by your insurance company, please allow us  1-2 business days to complete this process.  Drug prices often vary depending on where the prescription is filled and some pharmacies may offer cheaper prices.  The website www.goodrx.com contains coupons for medications through different pharmacies. The prices here do not account for what the cost may be with help from insurance (it may be cheaper with your insurance), but the website can give you the price if you did not use any insurance.  - You can print the associated coupon and take it with your prescription to the pharmacy.  - You may also stop by our office during regular business hours and pick up a GoodRx coupon card.  - If you need your prescription sent electronically to a different pharmacy, notify our office through [facility] or by phone at [phone] option 4.     Si Usted Necesita Algo Despus de Su  Visita  Tambin puede enviarnos un mensaje a travs de Clinical cytogeneticist. Por lo general respondemos a los mensajes de MyChart en el transcurso de 1 a 2 das hbiles.  Para renovar recetas, por favor pida a su farmacia que se ponga en contacto con nuestra oficina. Randi lakes de fax es [place] [phone].  Si tiene un asunto urgente cuando la clnica est cerrada y que no puede esperar hasta el siguiente da hbil, puede llamar/localizar a su doctor(a) al nmero que aparece a continuacin.   Por favor, tenga en cuenta que aunque hacemos todo lo posible para estar disponibles para asuntos urgentes fuera del horario de [place], no estamos disponibles las 24 horas del da, los 7 [address]  [address] de la [place].   Si tiene un problema urgente y no puede comunicarse con nosotros, puede optar por buscar atencin mdica  en el consultorio de su doctor(a), en una clnica privada, en un centro de atencin urgente o en una sala de emergencias.  Si tiene Engineer, drilling, por favor llame inmediatamente al 911 o vaya a la sala de emergencias.  Nmeros de bper  - Dr. Hester: [phone]  - Dra. Jackquline: [phone]  - Dr. Claudene: [phone]  - Dra. Kitts: [phone]  En caso de inclemencias del [place], por favor llame a nuestra lnea principal al [phone] para una actualizacin sobre el estado de cualquier retraso o cierre.  Consejos para la medicacin en dermatologa: Por favor, guarde las cajas en las que vienen los medicamentos de uso tpico para ayudarle a seguir las instrucciones sobre dnde y cmo usarlos. Las farmacias generalmente imprimen las instrucciones del medicamento slo en las cajas y no directamente en los tubos del [place].   Si su medicamento es muy caro, por favor, pngase en contacto con landry rieger llamando al [phone] y presione la opcin 4 o envenos un mensaje a travs de Clinical cytogeneticist.   No podemos decirle cul ser su copago por los medicamentos por adelantado ya que esto  es diferente dependiendo de la cobertura de su seguro. Sin embargo, es posible que podamos encontrar un medicamento sustituto a Audiological scientist un formulario para que el seguro cubra el medicamento que se considera necesario.   Si se requiere una autorizacin previa para que su compaa de seguros malta su medicamento, por favor permtanos de 1 a 2 das hbiles para completar este proceso.  Los precios de los medicamentos varan con frecuencia dependiendo del Environmental consultant de dnde se surte la receta y alguna farmacias pueden ofrecer precios ms baratos.  El sitio web www.goodrx.com tiene cupones para medicamentos de Health and safety inspector. Los precios aqu no tienen en cuenta lo que podra costar con la ayuda del seguro (puede ser ms barato con su seguro), pero el sitio web puede darle el precio si no utiliz Tourist information centre manager.  - Puede imprimir el cupn correspondiente y llevarlo con su receta a la farmacia.  - Tambin puede pasar por nuestra oficina durante el horario de atencin regular y Education officer, museum una tarjeta de cupones de GoodRx.  - Si necesita que su receta se enve electrnicamente a una farmacia diferente, informe a nuestra oficina a travs de MyChart de [place] o por telfono llamando al [phone] y presione la opcin 4.

## 2023-11-24 NOTE — Progress Notes (Signed)
 New Patient Visit   Subjective  Kristi Carter is a 63 y.o. female who presents for the following: Skin Cancer Screening and Full Body Skin Exam. No personal Hx of skin cancer. No personal Hx of dysplastic nevi.   The patient presents for Total-Body Skin Exam (TBSE) for skin cancer screening and mole check. The patient has spots, moles and lesions to be evaluated, some may be new or changing and the patient may have concern these could be cancer.  Spots on right lower leg get irritated by shaving.    The following portions of the chart were reviewed this encounter and updated as appropriate: medications, allergies, medical history  Review of Systems:  No other skin or systemic complaints except as noted in HPI or Assessment and Plan.  Objective  Well appearing patient in no apparent distress; mood and affect are within normal limits.  A full examination was performed including scalp, head, eyes, ears, nose, lips, neck, chest, axillae, abdomen, back, buttocks, bilateral upper extremities, bilateral lower extremities, hands, feet, fingers, toes, fingernails, and toenails. All findings within normal limits unless otherwise noted below.   Relevant physical exam findings are noted in the Assessment and Plan.  Exam of nails limited by presence of nail polish.  Right Lower lateral Leg  x1, spinal mid back x1, R pretibial x2 (4) Erythematous keratotic or waxy stuck-on papule  Assessment & Plan   SKIN CANCER SCREENING PERFORMED TODAY.  ACTINIC DAMAGE - Chronic condition, secondary to cumulative UV/sun exposure - diffuse scaly erythematous macules with underlying dyspigmentation - Recommend daily broad spectrum sunscreen SPF 30+ to sun-exposed areas, reapply every 2 hours as needed.  - Staying in the shade or wearing long sleeves, sun glasses (UVA+UVB protection) and wide brim hats (4-inch brim around the entire circumference of the hat) are also recommended for sun protection.  - Call  for new or changing lesions.  LENTIGINES, SEBORRHEIC KERATOSES, HEMANGIOMAS - Benign normal skin lesions - Benign-appearing - Call for any changes  MELANOCYTIC NEVI - Tan-brown and/or pink-flesh-colored symmetric macules and papules - Benign appearing on exam today - Observation - Call clinic for new or changing moles - Recommend daily use of broad spectrum spf 30+ sunscreen to sun-exposed areas.  - Check nails when remove polish.   INSECT BITE REACTION Exam: edematous pink papule(s) at left ankle  Treatment Plan: Benign, observe.    Recommend OTC 1% hydrocortisone  cream 1-2 times daily to affected area as needed for itch  Recommend DEET containing insect repellant (25% DEET or higher) such as [place] Off or Repel Sportsmen and wear protective clothing when outdoors.    Milia - tiny firm white papule at left malar cheek - type of cyst - benign - sometimes these will clear with nightly OTC adapalene/Differin 0.1% gel or retinol. 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  Trauma or pressure from sleeping on the ear or from cell phone use and sun damage may be exacerbating factors.  Treatment may include using a C-shaped airplane neck pillow for sleeping on the side of the head so no pressure is on the ear.  Other treatments include topical  or intralesional steroids; liquid nitrogen or laser destruction; shave removal or excision.  The condition can be difficult to treat and persist or recur despite treatment.  Treatment Plan: Recommend decreasing pressure on  left ear, try sleeping on right side, use a travel pillow The patient will observe these symptoms, and report promptly any worsening or unexpected persistence.     INFLAMED SEBORRHEIC KERATOSIS (4) Right Lower lateral Leg  x1, spinal mid back x1, R pretibial x2 (4) Symptomatic, irritating, patient would like treated. Destruction of lesion - Right Lower lateral Leg  x1, spinal mid back x1, R pretibial x2 (4)  Destruction method: cryotherapy   Informed consent: discussed and consent obtained   Lesion destroyed using liquid nitrogen: Yes   Region frozen until ice ball extended beyond lesion: Yes   Outcome: patient tolerated procedure well with no complications   Post-procedure details: wound care instructions given   Additional details:  Prior to procedure, discussed risks of blister formation, small wound, skin dyspigmentation, or rare scar following cryotherapy. Recommend Vaseline ointment to treated areas while healing.    Return in about 1 year (around 11/23/2024) for TBSE.  I, Kate Fought, CMA, am acting as scribe for Rexene Rattler, MD.   Documentation: I have reviewed the above documentation for accuracy and completeness, and I agree with the above.  Rexene Rattler, MD

## 2023-12-10 ENCOUNTER — Other Ambulatory Visit: Payer: Self-pay | Admitting: Obstetrics and Gynecology

## 2023-12-10 ENCOUNTER — Other Ambulatory Visit (HOSPITAL_COMMUNITY): Payer: Self-pay

## 2023-12-11 ENCOUNTER — Encounter (HOSPITAL_COMMUNITY): Payer: Self-pay

## 2023-12-11 ENCOUNTER — Other Ambulatory Visit (HOSPITAL_COMMUNITY): Payer: Self-pay

## 2023-12-11 ENCOUNTER — Other Ambulatory Visit: Payer: Self-pay

## 2023-12-13 ENCOUNTER — Telehealth: Admitting: Family

## 2023-12-13 DIAGNOSIS — M545 Low back pain, unspecified: Secondary | ICD-10-CM

## 2023-12-13 MED ORDER — BACLOFEN 10 MG PO TABS: 10.0000 mg | ORAL_TABLET | Freq: Three times a day (TID) | ORAL | 0 refills | Status: DC

## 2023-12-13 MED ORDER — NAPROXEN 500 MG PO TABS: 500.0000 mg | ORAL_TABLET | Freq: Two times a day (BID) | ORAL | 0 refills | Status: DC

## 2023-12-13 NOTE — Progress Notes (Signed)

## 2023-12-14 ENCOUNTER — Other Ambulatory Visit: Payer: Self-pay

## 2024-01-04 ENCOUNTER — Other Ambulatory Visit (HOSPITAL_COMMUNITY): Payer: Self-pay

## 2024-01-04 ENCOUNTER — Other Ambulatory Visit: Payer: Self-pay

## 2024-01-14 ENCOUNTER — Other Ambulatory Visit (HOSPITAL_COMMUNITY): Payer: Self-pay

## 2024-01-14 ENCOUNTER — Other Ambulatory Visit: Payer: Self-pay | Admitting: Nurse Practitioner

## 2024-01-14 ENCOUNTER — Other Ambulatory Visit: Payer: Self-pay

## 2024-01-15 ENCOUNTER — Other Ambulatory Visit: Payer: Self-pay

## 2024-01-15 ENCOUNTER — Telehealth: Payer: Self-pay | Admitting: Gastroenterology

## 2024-01-15 MED FILL — Clonazepam Tab 0.5 MG: ORAL | 30 days supply | Qty: 60 | Fill #0 | Status: AC

## 2024-01-15 NOTE — Telephone Encounter (Signed)
Controlled substance database reviewed.  Medication sent to pharmacy.

## 2024-01-15 NOTE — Telephone Encounter (Signed)
 Good afternoon Dr. San,   I received a call from this patient stating that she was wanting to transfer her care over to our practice. This patient was being seen with Dr. Ruel Kung but they have moved to [facility]. Patient has had a EGD back in December 4 th of 2024. Records are in [facility] ready for you to review. Would you please advise how patient can be scheduled.   Thank you.

## 2024-01-15 NOTE — Telephone Encounter (Signed)
 LOV  11/03/23  NOV  05/05/24  LAST FILL 11/03/23

## 2024-01-17 ENCOUNTER — Other Ambulatory Visit (HOSPITAL_COMMUNITY): Payer: Self-pay

## 2024-01-26 ENCOUNTER — Encounter: Payer: Self-pay | Admitting: Gastroenterology

## 2024-02-02 ENCOUNTER — Other Ambulatory Visit (HOSPITAL_COMMUNITY): Payer: Self-pay

## 2024-02-02 ENCOUNTER — Ambulatory Visit: Admitting: Dermatology

## 2024-02-07 ENCOUNTER — Other Ambulatory Visit (HOSPITAL_COMMUNITY): Payer: Self-pay

## 2024-02-17 ENCOUNTER — Other Ambulatory Visit: Payer: Self-pay

## 2024-02-17 MED FILL — Clonazepam Tab 0.5 MG: ORAL | 30 days supply | Qty: 60 | Fill #1 | Status: AC

## 2024-03-01 ENCOUNTER — Other Ambulatory Visit: Payer: Self-pay

## 2024-03-14 ENCOUNTER — Other Ambulatory Visit (HOSPITAL_BASED_OUTPATIENT_CLINIC_OR_DEPARTMENT_OTHER): Payer: Self-pay

## 2024-03-14 ENCOUNTER — Other Ambulatory Visit (HOSPITAL_COMMUNITY): Payer: Self-pay

## 2024-03-14 ENCOUNTER — Other Ambulatory Visit: Payer: Self-pay

## 2024-03-14 DIAGNOSIS — N390 Urinary tract infection, site not specified: Secondary | ICD-10-CM

## 2024-03-14 MED ORDER — METHENAMINE HIPPURATE 1 G PO TABS
1.0000 g | ORAL_TABLET | Freq: Two times a day (BID) | ORAL | 6 refills | Status: AC
  Filled 2024-03-14 – 2024-03-29 (×2): qty 90, 45d supply, fill #0

## 2024-03-19 ENCOUNTER — Encounter: Payer: Self-pay | Admitting: Nurse Practitioner

## 2024-03-21 ENCOUNTER — Ambulatory Visit: Admitting: Gastroenterology

## 2024-03-22 ENCOUNTER — Other Ambulatory Visit: Payer: Self-pay | Admitting: Nurse Practitioner

## 2024-03-22 ENCOUNTER — Other Ambulatory Visit: Payer: Self-pay

## 2024-03-25 ENCOUNTER — Other Ambulatory Visit: Payer: Self-pay

## 2024-03-25 ENCOUNTER — Other Ambulatory Visit (HOSPITAL_COMMUNITY): Payer: Self-pay

## 2024-03-25 ENCOUNTER — Encounter: Payer: Self-pay | Admitting: Nurse Practitioner

## 2024-03-25 MED FILL — Clonazepam Tab 0.5 MG: ORAL | 30 days supply | Qty: 60 | Fill #0 | Status: CN

## 2024-03-25 NOTE — Telephone Encounter (Signed)
 Copied from CRM [id number]. Topic: Clinical - Medication Question >> Mar 25, 2024  2:16 PM Kristi Carter wrote: Reason for CRM: Patient called in to follow up on refill for prescription clonazePAM  (KLONOPIN ) 0.5 MG tablet that was requested by pharmacy on 1/13

## 2024-03-25 NOTE — Telephone Encounter (Signed)
Controlled substance database reviewed.  Medication sent to pharmacy.

## 2024-03-26 ENCOUNTER — Other Ambulatory Visit: Payer: Self-pay

## 2024-03-26 ENCOUNTER — Other Ambulatory Visit (HOSPITAL_COMMUNITY): Payer: Self-pay

## 2024-03-26 MED FILL — Clonazepam Tab 0.5 MG: ORAL | 30 days supply | Qty: 60 | Fill #0 | Status: AC

## 2024-03-29 ENCOUNTER — Other Ambulatory Visit: Payer: Self-pay

## 2024-04-01 ENCOUNTER — Encounter: Payer: Self-pay | Admitting: Nurse Practitioner

## 2024-04-01 NOTE — Telephone Encounter (Signed)
 Patient wants to know if we can change her 6 month follow up to a virtual visit?

## 2024-04-11 ENCOUNTER — Telehealth: Admitting: Internal Medicine

## 2024-04-14 ENCOUNTER — Encounter: Payer: Self-pay | Admitting: Internal Medicine

## 2024-04-14 ENCOUNTER — Telehealth: Payer: Self-pay | Admitting: Internal Medicine

## 2024-04-14 ENCOUNTER — Telehealth: Admitting: Internal Medicine

## 2024-04-14 VITALS — Ht 63.0 in | Wt 138.0 lb

## 2024-04-14 DIAGNOSIS — E039 Hypothyroidism, unspecified: Secondary | ICD-10-CM

## 2024-04-14 NOTE — Telephone Encounter (Signed)
 Please schedule for follow-up with me in 1 year   Thanks

## 2024-04-14 NOTE — Progress Notes (Signed)
 " Virtual Visit via Video Note  I connected with Kristi Carter on 04/14/24 at 1 pm by a video enabled telemedicine application and verified that I am speaking with the correct person using two identifiers.   I discussed the limitations of evaluation and management by telemedicine and the availability of in person appointments. The patient expressed understanding and agreed to proceed.   -Location of the patient : Home -Location of the provider : Home -The names of all persons participating in the telemedicine service : Pt and myself        Name: Kristi Carter  MRN/ DOB: [id number], 1960/09/24    Age/ Sex: 64 y.o., female    PCP: Vincente Saber, NP   Reason for Endocrinology Evaluation: Hypothyroidism     Date of Initial Endocrinology Evaluation: 02/04/2022    HPI: Kristi Carter is a 64 y.o. female with a past medical history of Dyslipidemia, Barrett's esophagus , Hx of A. Fib S/P cardioversion and Hypothyroidism. The patient presented for initial endocrinology clinic visit on 02/04/2022 for consultative assistance with her Hypothyroidism.   She has been noted with hypothyroidism ~ 10 yrs ago  No prior hx of   She had a thyroid  nodule while living in [place] , no prior FNA . Ultrasound in 2020 did NOT show any nodules.    No family history of thyroid  disease  SUBJECTIVE:    Today (04/14/24:  Kristi Carter is here for a follow up on Hypothyroidism.  Patient denies any local neck symptoms No palpitations No constipation or diarrhea She is not on biotin Not on multivitamin  She is compliant with levothyroxine  intake   Levothyroxine  50 mcg daily   HISTORY:  Past Medical History:  Past Medical History:  Diagnosis Date   A-fib (HCC)    ablation in 2015   Allergy    Antibiotic-induced yeast infection 07/20/2019   Anxiety    GERD (gastroesophageal reflux disease)    Hypothyroid    Past Surgical History:  Past Surgical History:  Procedure  Laterality Date   AUGMENTATION MAMMAPLASTY Bilateral 1990   saline   BIOPSY  02/11/2023   Procedure: BIOPSY;  Surgeon: Therisa Bi, MD;  Location: [facility] ENDOSCOPY;  Service: Gastroenterology;;   CARDIAC ELECTROPHYSIOLOGY MAPPING AND ABLATION     COLONOSCOPY WITH PROPOFOL  N/A 04/13/2020   Procedure: COLONOSCOPY WITH PROPOFOL ;  Surgeon: Therisa Bi, MD;  Location: [facility] ENDOSCOPY;  Service: Gastroenterology;  Laterality: N/A;   ESOPHAGOGASTRODUODENOSCOPY (EGD) WITH PROPOFOL  N/A 04/13/2020   Procedure: ESOPHAGOGASTRODUODENOSCOPY (EGD) WITH PROPOFOL ;  Surgeon: Therisa Bi, MD;  Location[facility] ENDOSCOPY;  Service: Gastroenterology;  Laterality: N/A;   ESOPHAGOGASTRODUODENOSCOPY (EGD) WITH PROPOFOL  N/A 07/16/2020   Procedure: ESOPHAGOGASTRODUODENOSCOPY (EGD) WITH PROPOFOL ;  Surgeon: Therisa Bi, MD;  Location: [facility] ENDOSCOPY;  Service: Gastroenterology;  Laterality: N/A;   ESOPHAGOGASTRODUODENOSCOPY (EGD) WITH PROPOFOL  N/A 02/11/2023   Procedure: ESOPHAGOGASTRODUODENOSCOPY (EGD) WITH PROPOFOL ;  Surgeon: Therisa Bi, MD;  Location[facility] ENDOSCOPY;  Service: Gastroenterology;  Laterality: N/A;    Social History:  reports that she has never smoked. She has never used smokeless tobacco. She reports current alcohol use of about 10.0 standard drinks of alcohol per week. She reports that she does not use drugs. Family History: family history includes Cancer in her paternal uncle; Healthy in her father; Heart attack in her paternal grandfather; Stroke in her paternal grandmother.   HOME MEDICATIONS: Allergies as of 04/14/2024       Reactions   Other    Cats, Mold and Feathers  Prednisone    Other reaction(s): Vomiting   Tape         Medication List        Accurate as of April 14, 2024  7:03 AM. If you have any questions, ask your nurse or doctor.          atorvastatin  40 MG tablet Commonly known as: LIPITOR Take 1 tablet (40 mg total) by mouth daily.   baclofen  10 MG tablet Commonly  known as: LIORESAL  Take 1 tablet (10 mg total) by mouth 3 (three) times daily.   clonazePAM  0.5 MG tablet Commonly known as: KLONOPIN  Take 1 tablet (0.5 mg total) by mouth 2 (two) times daily as needed for anxiety.   esomeprazole  40 MG capsule Commonly known as: NEXIUM  Take 1 capsule (40 mg total) by mouth daily.   estradiol  0.1 MG/GM vaginal cream Commonly known as: ESTRACE  Apply pea sized amount to tip of finger to urethra before bed. Wash hands well after application. Use Monday, Wednesday and Friday. Discard applicator.   fluconazole  150 MG tablet Commonly known as: Diflucan  Take 1 tablet (150 mg total) by mouth daily.   levothyroxine  50 MCG tablet Commonly known as: SYNTHROID  Take 1 tablet (50 mcg total) by mouth daily before breakfast.   methenamine  1 g tablet Commonly known as: Hiprex  Take 1 tablet (1 g total) by mouth 2 (two) times daily with a meal.   naproxen  500 MG tablet Commonly known as: Naprosyn  Take 1 tablet (500 mg total) by mouth 2 (two) times daily with a meal.   valACYclovir  500 MG tablet Commonly known as: VALTREX  Take 1 tablet (500 mg total) by mouth 2 (two) times daily for 3 days as needed          REVIEW OF SYSTEMS: A comprehensive ROS was conducted with the patient and is negative except as per HPI    OBJECTIVE:  VS: There were no vitals taken for this visit.   Wt Readings from Last 3 Encounters:  11/03/23 156 lb 12.8 oz (71.1 kg)  10/26/23 159 lb (72.1 kg)  10/16/23 154 lb (69.9 kg)     EXAM: General: Pt appears well and is in NAD  Mental Status: Judgment, insight: Intact Orientation: Oriented to time, place, and person Mood and affect: No depression, anxiety, or agitation     DATA REVIEWED:   Latest Reference Range & Units 04/28/23 13:17  TSH 0.40 - 4.50 mIU/L 1.74  T4,Free(Direct) 0.8 - 1.8 ng/dL 1.6    Thyroid  Ultrasound 03/19/2018    Parenchymal Echotexture: Mildly heterogenous   Isthmus: 0.4 cm   Right lobe: 3.7  x 0.8 x 1.3 cm   Left lobe: 3.3 x 0.6 x 1.0 cm   _________________________________________________________   Estimated total number of nodules >/= 1 cm: 0   Number of spongiform nodules >/=  2 cm not described below (TR1): 0   Number of mixed cystic and solid nodules >/= 1.5 cm not described below (TR2): 0   _________________________________________________________   No discrete nodules are seen within the thyroid  gland.   IMPRESSION: There is no evidence of thyroid  nodule. The gland is within normal limits in size.    ASSESSMENT/PLAN/RECOMMENDATIONS:   Hypothyroidism:  - Patient is clinically euthyroid -No local neck symptoms -Patient advised that if she forgets to take levothyroxine  1 day, she may double up the next morning - Patient will have labs done through [facility] [address] in [place], order will be faxed   Medications : Continue Levothyroxine  50 mcg  daily    Follow-up in 1 yr  Signed electronically by: Stefano Redgie Butts, MD  [facility] Endocr[facility]ology  [facility] [address] [place] [place], [place] [address] Phone: [phone] FAX: [phone]   CC: Vincente Saber, NP [address] [place] [address] Phone: [phone] Fax: [phone]   Return to Endocrinology clinic as below: Future Appointments  Date Time Provider Department Center  04/14/2024 12:10 PM Zyra Parrillo, Donell Redgie, MD [facility] None  05/05/2024  3:00 PM Vincente Saber, NP [facility] [id number]  05/06/2024  2:00 PM [place] Sand[place] GAILS, DO [facility]  10/25/2024 11:20 AM Maurine Lukes, PA-C [facility] None  11/29/2024  4:30 PM Jackquline Sawyer, MD [facility] None          "

## 2024-04-27 ENCOUNTER — Ambulatory Visit: Admitting: Physician Assistant

## 2024-04-29 ENCOUNTER — Ambulatory Visit: Payer: Commercial Managed Care - PPO | Admitting: Internal Medicine

## 2024-05-05 ENCOUNTER — Ambulatory Visit: Admitting: Nurse Practitioner

## 2024-05-06 ENCOUNTER — Ambulatory Visit: Admitting: Gastroenterology

## 2024-10-25 ENCOUNTER — Ambulatory Visit: Admitting: Physician Assistant

## 2024-11-29 ENCOUNTER — Encounter: Admitting: Dermatology

## 2025-04-14 ENCOUNTER — Ambulatory Visit: Admitting: Internal Medicine
